# Patient Record
Sex: Female | Born: 1935 | Race: White | Hispanic: No | Marital: Married | Smoking: Former smoker
Health system: Southern US, Community
[De-identification: ages and names within clinical notes are randomized; demographics above are authoritative.]

## PROBLEM LIST (undated history)

## (undated) DIAGNOSIS — Z90710 Acquired absence of both cervix and uterus: Secondary | ICD-10-CM

## (undated) DIAGNOSIS — N6019 Diffuse cystic mastopathy of unspecified breast: Secondary | ICD-10-CM

## (undated) DIAGNOSIS — K219 Gastro-esophageal reflux disease without esophagitis: Secondary | ICD-10-CM

## (undated) DIAGNOSIS — S42309A Unspecified fracture of shaft of humerus, unspecified arm, initial encounter for closed fracture: Secondary | ICD-10-CM

## (undated) DIAGNOSIS — I10 Essential (primary) hypertension: Secondary | ICD-10-CM

## (undated) DIAGNOSIS — E782 Mixed hyperlipidemia: Secondary | ICD-10-CM

## (undated) DIAGNOSIS — E7800 Pure hypercholesterolemia, unspecified: Secondary | ICD-10-CM

## (undated) DIAGNOSIS — Z8619 Personal history of other infectious and parasitic diseases: Secondary | ICD-10-CM

## (undated) HISTORY — DX: Acquired absence of both cervix and uterus: Z90.710

## (undated) HISTORY — PX: TONSILLECTOMY: SUR1361

## (undated) HISTORY — PX: TOTAL HIP ARTHROPLASTY: SHX124

## (undated) HISTORY — PX: OTHER SURGICAL HISTORY: SHX169

## (undated) HISTORY — DX: Personal history of other infectious and parasitic diseases: Z86.19

## (undated) HISTORY — PX: BELPHAROPTOSIS REPAIR: SHX369

## (undated) HISTORY — DX: Mixed hyperlipidemia: E78.2

## (undated) HISTORY — DX: Essential (primary) hypertension: I10

## (undated) HISTORY — DX: Diffuse cystic mastopathy of unspecified breast: N60.19

## (undated) HISTORY — PX: TONSILLECTOMY: SHX5217

## (undated) HISTORY — DX: Pure hypercholesterolemia, unspecified: E78.00

## (undated) HISTORY — PX: COLONOSCOPY: SHX174

## (undated) HISTORY — PX: ABDOMINAL HYSTERECTOMY: SHX81

---

## 1972-03-14 HISTORY — PX: VEIN LIGATION: SHX2652

## 1982-03-14 DIAGNOSIS — I701 Atherosclerosis of renal artery: Secondary | ICD-10-CM

## 1982-03-14 DIAGNOSIS — I10 Essential (primary) hypertension: Secondary | ICD-10-CM

## 1982-03-14 HISTORY — DX: Atherosclerosis of renal artery: I70.1

## 1982-03-14 HISTORY — DX: Essential (primary) hypertension: I10

## 1983-03-15 HISTORY — PX: BREAST LUMPECTOMY: SHX2

## 1985-03-14 HISTORY — PX: RENAL ARTERY ANGIOPLASTY: SHX2317

## 2000-03-14 HISTORY — PX: WRIST FRACTURE SURGERY: SHX121

## 2000-08-22 ENCOUNTER — Ambulatory Visit (HOSPITAL_BASED_OUTPATIENT_CLINIC_OR_DEPARTMENT_OTHER): Admission: RE | Admit: 2000-08-22 | Discharge: 2000-08-22 | Payer: Self-pay | Admitting: Plastic Surgery

## 2000-11-27 ENCOUNTER — Encounter (HOSPITAL_COMMUNITY): Admission: RE | Admit: 2000-11-27 | Discharge: 2000-12-27 | Payer: Self-pay | Admitting: Sports Medicine

## 2001-07-31 ENCOUNTER — Encounter: Payer: Self-pay | Admitting: Cardiology

## 2001-07-31 ENCOUNTER — Ambulatory Visit (HOSPITAL_COMMUNITY): Admission: RE | Admit: 2001-07-31 | Discharge: 2001-07-31 | Payer: Self-pay | Admitting: Cardiology

## 2001-08-20 ENCOUNTER — Ambulatory Visit (HOSPITAL_COMMUNITY): Admission: RE | Admit: 2001-08-20 | Discharge: 2001-08-20 | Payer: Self-pay | Admitting: Cardiology

## 2001-08-20 ENCOUNTER — Encounter: Payer: Self-pay | Admitting: Cardiology

## 2002-05-15 ENCOUNTER — Encounter: Payer: Self-pay | Admitting: *Deleted

## 2002-05-15 ENCOUNTER — Observation Stay (HOSPITAL_COMMUNITY): Admission: EM | Admit: 2002-05-15 | Discharge: 2002-05-15 | Payer: Self-pay | Admitting: Emergency Medicine

## 2002-05-16 ENCOUNTER — Ambulatory Visit (HOSPITAL_BASED_OUTPATIENT_CLINIC_OR_DEPARTMENT_OTHER): Admission: RE | Admit: 2002-05-16 | Discharge: 2002-05-16 | Payer: Self-pay | Admitting: Orthopedic Surgery

## 2002-12-31 ENCOUNTER — Encounter: Admission: RE | Admit: 2002-12-31 | Discharge: 2002-12-31 | Payer: Self-pay | Admitting: Orthopaedic Surgery

## 2002-12-31 ENCOUNTER — Encounter: Payer: Self-pay | Admitting: Orthopaedic Surgery

## 2003-05-12 ENCOUNTER — Inpatient Hospital Stay (HOSPITAL_COMMUNITY): Admission: RE | Admit: 2003-05-12 | Discharge: 2003-05-15 | Payer: Self-pay | Admitting: Orthopedic Surgery

## 2008-07-30 ENCOUNTER — Inpatient Hospital Stay (HOSPITAL_COMMUNITY): Admission: RE | Admit: 2008-07-30 | Discharge: 2008-08-02 | Payer: Self-pay | Admitting: Orthopedic Surgery

## 2009-10-13 ENCOUNTER — Ambulatory Visit: Payer: Self-pay | Admitting: Cardiology

## 2010-01-15 ENCOUNTER — Ambulatory Visit: Payer: Self-pay | Admitting: Cardiology

## 2010-06-15 ENCOUNTER — Other Ambulatory Visit: Payer: Self-pay | Admitting: *Deleted

## 2010-06-15 DIAGNOSIS — G4700 Insomnia, unspecified: Secondary | ICD-10-CM

## 2010-06-15 NOTE — Telephone Encounter (Signed)
Refilled meds per fax request.  

## 2010-06-16 MED ORDER — TEMAZEPAM 15 MG PO CAPS: ORAL_CAPSULE | ORAL | Status: DC

## 2010-06-22 ENCOUNTER — Telehealth: Payer: Self-pay | Admitting: Cardiology

## 2010-06-22 LAB — PROTIME-INR
INR: 1.1 (ref 0.00–1.49)
INR: 1.6 — ABNORMAL HIGH (ref 0.00–1.49)
Prothrombin Time: 14.1 seconds (ref 11.6–15.2)
Prothrombin Time: 17.9 seconds — ABNORMAL HIGH (ref 11.6–15.2)
Prothrombin Time: 19.8 seconds — ABNORMAL HIGH (ref 11.6–15.2)

## 2010-06-22 LAB — BASIC METABOLIC PANEL
BUN: 6 mg/dL (ref 6–23)
BUN: 7 mg/dL (ref 6–23)
CO2: 32 mEq/L (ref 19–32)
Calcium: 8.3 mg/dL — ABNORMAL LOW (ref 8.4–10.5)
Chloride: 102 mEq/L (ref 96–112)
Creatinine, Ser: 0.67 mg/dL (ref 0.4–1.2)
Creatinine, Ser: 0.8 mg/dL (ref 0.4–1.2)
GFR calc Af Amer: >60 mL/min (ref >60)
GFR calc non Af Amer: >60 mL/min (ref >60)
Glucose, Bld: 121 mg/dL — ABNORMAL HIGH (ref 70–99)
Glucose, Bld: 147 mg/dL — ABNORMAL HIGH (ref 70–99)

## 2010-06-22 LAB — URINALYSIS, ROUTINE W REFLEX MICROSCOPIC
Glucose, UA: NEGATIVE mg/dL
Hgb urine dipstick: NEGATIVE
Specific Gravity, Urine: 1.018 (ref 1.005–1.030)
pH: 7 (ref 5.0–8.0)

## 2010-06-22 LAB — CBC
HCT: 31.5 % — ABNORMAL LOW (ref 36.0–46.0)
Hemoglobin: 13.5 g/dL (ref 12.0–15.0)
MCHC: 33.9 g/dL (ref 30.0–36.0)
MCHC: 33.9 g/dL (ref 30.0–36.0)
MCHC: 34.6 g/dL (ref 30.0–36.0)
MCV: 92.6 fL (ref 78.0–100.0)
MCV: 93.2 fL (ref 78.0–100.0)
Platelets: 148 10*3/uL — ABNORMAL LOW (ref 150–400)
Platelets: 159 10*3/uL (ref 150–400)
Platelets: 165 10*3/uL (ref 150–400)
RBC: 3.32 MIL/uL — ABNORMAL LOW (ref 3.87–5.11)
RBC: 3.56 MIL/uL — ABNORMAL LOW (ref 3.87–5.11)
RBC: 4.25 MIL/uL (ref 3.87–5.11)
RDW: 13.3 % (ref 11.5–15.5)
RDW: 13.6 % (ref 11.5–15.5)
RDW: 13.5 % (ref 11.5–15.5)
WBC: 8.5 10*3/uL (ref 4.0–10.5)
WBC: 8.7 10*3/uL (ref 4.0–10.5)

## 2010-06-22 LAB — TYPE AND SCREEN
ABO/RH(D): A POS
Antibody Screen: NEGATIVE

## 2010-06-22 LAB — COMPREHENSIVE METABOLIC PANEL
ALT: 19 U/L (ref 0–35)
AST: 23 U/L (ref 0–37)
Alkaline Phosphatase: 53 U/L (ref 39–117)
Calcium: 9.2 mg/dL (ref 8.4–10.5)
GFR calc Af Amer: >60 mL/min (ref >60)
Potassium: 3.8 mEq/L (ref 3.5–5.1)
Sodium: 140 mEq/L (ref 135–145)
Total Protein: 7.4 g/dL (ref 6.0–8.3)

## 2010-06-22 NOTE — Telephone Encounter (Signed)
Wants RN to call her back regarding a dermatologist referral/appointment. Chart placed in [facility] box

## 2010-06-22 NOTE — Telephone Encounter (Signed)
Dr wanting a referral to Dr. Wendi Maya -Dermatologist at [facility]. -pt states the first available appt,. Is in July 2012; per Dr. Glenetta Hew that appt and continue to call to see if there is a cancellation.  Pt was advised.

## 2010-07-05 ENCOUNTER — Telehealth: Payer: Self-pay | Admitting: Cardiology

## 2010-07-05 DIAGNOSIS — L853 Xerosis cutis: Secondary | ICD-10-CM

## 2010-07-05 DIAGNOSIS — I119 Hypertensive heart disease without heart failure: Secondary | ICD-10-CM

## 2010-07-05 NOTE — Telephone Encounter (Signed)
PT MADE AN APPT TO COME BACK IN MAY FOR LABS AND OV-SHE WANTS TO KNOW IF SHE CAN HAVE AN XRAY DONE WHEN SHE COMES IN FOR THE OV? ALSO, WHEN IS THE LAST TIME HER THYROID WAS CHECKED? PLACED CHART IN BOX.

## 2010-07-07 NOTE — Telephone Encounter (Signed)
Spoke with patient and ordered chest xray and thyroid panel for her dry skin

## 2010-07-13 ENCOUNTER — Other Ambulatory Visit: Payer: Self-pay | Admitting: *Deleted

## 2010-07-13 DIAGNOSIS — E7800 Pure hypercholesterolemia, unspecified: Secondary | ICD-10-CM

## 2010-07-26 ENCOUNTER — Other Ambulatory Visit: Payer: Self-pay | Admitting: *Deleted

## 2010-07-26 DIAGNOSIS — I10 Essential (primary) hypertension: Secondary | ICD-10-CM

## 2010-07-26 MED ORDER — LOSARTAN POTASSIUM-HCTZ 100-25 MG PO TABS: 1.0000 | ORAL_TABLET | Freq: Every day | ORAL | Status: DC

## 2010-07-26 NOTE — Telephone Encounter (Signed)
Faxed refill for losartan/hctz to [facility].

## 2010-07-27 NOTE — H&P (Signed)
NAME:  [name], [name] NO.:  [id number]   MEDICAL RECORD NO.:  [id number]          PATIENT TYPE:  INP   LOCATION:  1608                         FACILITY:  [facility]   PHYSICIAN:  Ollen Gross, M.D.    DATE OF BIRTH:  09-03-1933   DATE OF ADMISSION:  07/30/2008  DATE OF DISCHARGE:                              HISTORY & PHYSICAL   DATE OF OFFICE VISIT, HISTORY AND PHYSICAL:  Jul 15, 2008.   CHIEF COMPLAINT:  Left hip pain.   HISTORY OF PRESENT ILLNESS:  The patient is a 77 year old female who has  seen by Dr. Lequita Halt for ongoing left hip pain.  She has known end-stage  arthritis with some dysplasia.  The hip continues to hurt, progressive  in nature.  She felt she would benefit undergoing surgical intervention.  Risks and benefits have been discussed.  She elects to proceed with  surgery.   ALLERGIES:  CIPRO causes a rash, BENADRYL causes her to, be weird, and  keeps awake, MORPHINE causes nervousness and agitation.   CURRENT MEDICATIONS:  Losartan, baby aspirin, Zetia, nadolol, Caltrate  calcium and vitamin D.   PAST MEDICAL HISTORY:  1. Seasonal allergies.  2. Hypertension.  3. Exogenous obesity.  4. Osteopenia.  5. Childhood illness of measles.   PAST SURGERIES:  1. Varicose vein surgery.  2. Renal artery dilatation.  3. Tonsils.  4. Benign lumpectomy.  5. Blepharoplasty.  6. Hysterectomy.  7. Right total hip replacement.   FAMILY HISTORY:  Mother deceased age 87 with a brain tumor.  Father  deceased in his 33s with multiple myeloma.   SOCIAL HISTORY:  Married, housewife, past smoker, 2 children.  She does  have a caregiver lined up.  She does have a living will.   REVIEW OF SYSTEMS:  GENERAL:  No fevers, chills or night sweats.  NEUROLOGIC:  No seizures, syncope or paralysis.  RESPIRATORY:  No  shortness breath, productive cough or hemoptysis.  CARDIOVASCULAR:  No  chest pain, angina or orthopnea.  GI: No nausea, vomiting, diarrhea,  constipation.  GU: No dysuria, hematuria or discharge.  MUSCULOSKELETAL:  Left hip.   PHYSICAL EXAMINATION:  VITAL SIGNS: Pulse 64, respirations 12, blood  pressure 142/76.  GENERAL:  A 77 year old female, well-nourished, well-developed, in no  acute distress, short-statured.  She is alert and oriented, cooperative.  HEENT:  Normocephalic, atraumatic.  Pupils round and reactive.  EOMs  intact.  NECK:  Supple.  CHEST:  Clear.  HEART:  Regular rate and rhythm.  No murmur.  ABDOMEN:  Soft, nontender.  Bowel sounds present.  RECTAL, BREASTS, GENITALIA:  Not done, not pertinent to present illness.  EXTREMITIES:  Left hip:  Flexion 100, internal rotation 10, external  rotation 20, abduction 30.   IMPRESSION:  Osteoarthritis, left hip.   PLAN:  The patient admitted to [facility] to undergo a left  total replacement and arthroplasty.  She has been seen preoperatively by  Dr. Patty Sermons, underwent a stress test back on Jul 14, 2008.  Normal  stress nuclear test, no ischemia, normal LV function.  Felt to be stable  and okay for surgery.      Alexzandrew L. Perkins, P.A.C.      Ollen Gross, M.D.  Electronically Signed    ALP/MEDQ  D:  07/30/2008  T:  07/30/2008  Job:  [id number]   cc:   Ollen Gross, M.D.  Fax: [phone]   Cassell Clement, M.D.  Fax: [phone]

## 2010-07-27 NOTE — Op Note (Signed)
NAME:  Kelsey, Glass NO.:  [id number]   MEDICAL RECORD NO.:  [id number]          PATIENT TYPE:  INP   LOCATION:  0003                         FACILITY:  [facility]   PHYSICIAN:  Ollen Gross, M.D.    DATE OF BIRTH:  14-Oct-1933   DATE OF PROCEDURE:  07/30/2008  DATE OF DISCHARGE:                               OPERATIVE REPORT   PREOPERATIVE DIAGNOSES:  Osteoarthritis, left hip.   POSTOPERATIVE DIAGNOSES:  Osteoarthritis, left hip.   PROCEDURE:  Left total hip arthroplasty.   SURGEON:  Ollen Gross, M.D.   ASSISTANT:  Avel Peace PA-C   ANESTHESIA:  Spinal.   ESTIMATED BLOOD LOSS:  300.   DRAINS:  Hemovac times one.   COMPLICATIONS:  None.   CONDITION:  Stable to recovery.   CLINICAL NOTE:  [name] is a 77 year old female with severe end-stage  arthritis of the left hip with progressively worsening pain and  dysfunction.  She has had a previous successful right total hip  arthroplasty and presents now for left total hip arthroplasty.   PROCEDURE IN DETAIL:  After successful administration of spinal  anesthetic, the patient is placed in the right lateral decubitus  position with the left side up and held with the hip positioner.  Left  lower extremity is isolated from her perineum with plastic drapes and  prepped and draped in the usual sterile fashion.  Short posterolateral  incision is made with a 10-blade through subcutaneous tissue to the  level of fascia lata which is incised in line with the skin incision.  The sciatic nerve is palpated and protected and the short external  rotators isolated off the femur.  Capsulotomy is then performed and the  capsule retracted.  The hip is then dislocated.  The center of the  femoral head is marked and a trial prosthesis is placed such that the  center of the trial head corresponds to the center of native femoral  head.  Osteotomy line is marked on the femoral neck and osteotomy made  with an oscillating saw.   The femoral head is then removed and the femur  retracted anteriorly to gain acetabular exposure.   Acetabular reaming is initiated with a 45, coursing increments of 2 to a  55 and a 56-mm Pinnacle acetabular shell is placed in anatomic position  with excellent purchase and transfixed with an additional dome screw.  The apex hole eliminator is placed and then the permanent 40-mm neutral  ULTAMET metal liner is placed for metal-on-metal hip replacement.   The femur is prepared with the canal finder and irrigation.  Axial  reaming is performed to 13.5 mm, proximal reaming to an 59F and the  sleeve machined to a large.  59F large trial sleeve is placed with an 18  x 13 stem and 36 + 8 neck, 10 degrees beyond her native anteversion.  A  trial 40 + 0 head is placed and reduces too easily.  I went to a 40 + 6,  which reduces with more appropriate soft-tissue tension.  She had great  stability with  full extension, full external rotation 70 degrees  flexion, 40 degrees adduction and 90 degrees internal rotation and 90  degrees of flexion and 70 degrees of internal rotation.  By placing the  left leg on top of the right it is found that the leg lengths are equal.  The hip is then dislocated and the trials are removed.  The permanent  26F large sleeve is placed into the femur and then the 18 x 13 stem with  36 + 8 neck, 10 degrees beyond native anteversion, is impacted.  The 40  + 6 head is placed and the hip is reduced with the same stability  parameters.  Again, the leg lengths are found to be equal.   We then copiously irrigated the wound and reattached the short rotators  and capsule to the femur with Ethibond suture.  Fascia lata was closed  over a Hemovac drain with interrupted #1 Vicryl, subcu closed #1 and 2-0  Vicryl and subcuticular running 4-0 Monocryl.  The incision is then  cleaned and dried and Steri-Strips and a bulky sterile dressing applied.  Drain is hooked to suction and the  left lower extremity placed in a knee  immobilizer.  She is then awakened and transported to recovery in stable  condition.      Ollen Gross, M.D.  Electronically Signed     FA/MEDQ  D:  07/30/2008  T:  07/30/2008  Job:  [id number]

## 2010-07-29 ENCOUNTER — Other Ambulatory Visit (INDEPENDENT_AMBULATORY_CARE_PROVIDER_SITE_OTHER): Payer: Medicare Other | Admitting: *Deleted

## 2010-07-29 DIAGNOSIS — E7800 Pure hypercholesterolemia, unspecified: Secondary | ICD-10-CM

## 2010-07-29 DIAGNOSIS — L853 Xerosis cutis: Secondary | ICD-10-CM

## 2010-07-29 DIAGNOSIS — R238 Other skin changes: Secondary | ICD-10-CM

## 2010-07-29 LAB — CBC WITH DIFFERENTIAL/PLATELET
Basophils Absolute: 0.1 K/uL (ref 0.0–0.1)
Basophils Relative: 1 % (ref 0.0–3.0)
Eosinophils Absolute: 0.3 K/uL (ref 0.0–0.7)
Eosinophils Relative: 6 % — ABNORMAL HIGH (ref 0.0–5.0)
HCT: 37.3 % (ref 36.0–46.0)
Hemoglobin: 12.6 g/dL (ref 12.0–15.0)
Lymphocytes Relative: 25.7 % (ref 12.0–46.0)
Lymphs Abs: 1.5 K/uL (ref 0.7–4.0)
MCHC: 33.7 g/dL (ref 30.0–36.0)
MCV: 92.6 fl (ref 78.0–100.0)
Monocytes Absolute: 0.6 K/uL (ref 0.1–1.0)
Monocytes Relative: 9.8 % (ref 3.0–12.0)
Neutro Abs: 3.3 K/uL (ref 1.4–7.7)
Neutrophils Relative %: 57.5 % (ref 43.0–77.0)
Platelets: 212 K/uL (ref 150.0–400.0)
RBC: 4.03 Mil/uL (ref 3.87–5.11)
RDW: 14.3 % (ref 11.5–14.6)
WBC: 5.7 K/uL (ref 4.5–10.5)

## 2010-07-29 LAB — BASIC METABOLIC PANEL WITH GFR
BUN: 17 mg/dL (ref 6–23)
CO2: 33 meq/L — ABNORMAL HIGH (ref 19–32)
Calcium: 9.3 mg/dL (ref 8.4–10.5)
Chloride: 103 meq/L (ref 96–112)
Creatinine, Ser: 0.9 mg/dL (ref 0.4–1.2)
GFR: 69.29 mL/min
Glucose, Bld: 83 mg/dL (ref 70–99)
Potassium: 4 meq/L (ref 3.5–5.1)
Sodium: 141 meq/L (ref 135–145)

## 2010-07-29 LAB — HEPATIC FUNCTION PANEL
ALT: 17 U/L (ref 0–35)
Albumin: 3.4 g/dL — ABNORMAL LOW (ref 3.5–5.2)
Total Protein: 6.4 g/dL (ref 6.0–8.3)

## 2010-07-29 LAB — LIPID PANEL
Cholesterol: 156 mg/dL (ref 0–200)
Triglycerides: 84 mg/dL (ref 0.0–149.0)

## 2010-07-29 LAB — T4, FREE: Free T4: 0.84 ng/dL (ref 0.60–1.60)

## 2010-07-29 LAB — TSH: TSH: 2.49 u[IU]/mL (ref 0.35–5.50)

## 2010-07-30 NOTE — Op Note (Signed)
[place]  Patient:    [name], [name]                            MRN: [id number] Proc. Date: 08/22/00 Adm. 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  OPERATION PERFORMED:  Bilateral upper blepharoplasty.  SURGEON:  Consuello Bossier., M.D.  ANESTHESIA:  Xylocaine 1% with epinephrine 1:100,000 with MAC.  FINDINGS:  The patient had bilateral upper blepharochalasis and had had previous visual field studies which had shown some visual field impairment. She also had quite a bit of medial fat.  The above surgical procedure was carried out.  DESCRIPTION OF PROCEDURE:  The patient was brought to the operating room having been marked for the planned extent of the incisions from medially to laterally as well as the fat to be removed medially.  She was given an IV monitored with anesthesia standby throughout.  She was prepped with Betadine and draped sterilely about the face.  She was marked for elliptical upper eyelid incisions to be made.  She was anesthetized with Xylocaine 1% with epinephrine 1:100,000.  Following the onset of anesthesia, the upper eyelid incisions were made and resection of this ellipse with underlying strip of orbicularis muscle was performed.  Bleeding was controlled with electrocautery unit.  Medially the fairly prominent fat pad which was more prominent on the left than the right was easily identified and brought into the wound and the base of it resected with the electrocautery unit, bleeding further controlled with a electrocautery unit from this area was further treated in terms of the fat.  At this point the upper eyelid incisions were closed with interrupted and running #6-0 Prolene  producing 1 to 2 mm of lag ophthalmus.  Neosporin ointment and moistened saline compresses were applied.  The patient tolerated the procedure well and was able to  be discharged from the operating room to the recovery room subsequently to be followed by me as an outpatient. DD:  08/22/00 TD:  08/22/00 Job: [id number]

## 2010-07-30 NOTE — Discharge Summary (Signed)
NAME:  Kelsey, Glass NO.:  [id number]   MEDICAL RECORD NO.:  [id number]          PATIENT TYPE:  INP   LOCATION:  1608                         FACILITY:  [facility]   PHYSICIAN:  Ollen Gross, M.D.    DATE OF BIRTH:  [birth year]   DATE OF ADMISSION:  07/30/2008  DATE OF DISCHARGE:  08/02/2008                               DISCHARGE SUMMARY   ADMITTING DIAGNOSES:  1. Osteoarthritis left hip.  2. Hypertension.  3. Seasonal allergies.   DISCHARGE DIAGNOSES:  1. Osteoarthritis left hip, status post left total hip replacement      arthroplasty.  2. Hypertension.  3. Seasonal allergies.   PROCEDURE:  Jul 30, 2008, left total hip.  Surgeon Dr. Lequita Halt,  assistant Avel Peace, P.A.C.  Spinal anesthesia.   CONSULTS:  None.   BRIEF HISTORY:  [name] is a 77 year old female with severe  _osteoarthritis of the left hip with progressive worsening pain and  dysfunction who failed operative management.  She has had previous right  total hip and now presents for a left total hip.   LABORATORY DATA:  Preop CBC showed a hemoglobin 13.5, hematocrit 39.1,  white cell count 5, platelets 197,000.  Postop hemoglobin 11.2, then  10.6.  Last night hemoglobin and hematocrit 10.5 and 31.  PT and PTT  preop 12.9 and 28 respectively.  INR 1.  Serial protimes followed per  Coumadin protocol.  Last known PT/INR of 19.8 and 1.6.  Chem panel on  admission within normal limits.  Serial BMETs were followed.  Electrolytes remained within normal limits.  Preop UA negative.  Blood  group type A+.   EKG July 09, 2008, normal sinus rhythm, nonspecific T changes confirmed  by Dr. Patty Sermons.  Pelvis film Jul 30, 2008, status post left total hip  without acute complications.  Left hip film Jul 30, 2008, no acute  findings, severe left hip osteoarthritis.  Left hip film Jul 30, 2008,  left hip prosthesis without acute complications.   HOSPITAL COURSE:  The patient admitted to [facility] and taken  to the OR and underwent the above stated procedure without complication.  The patient tolerated the procedure well and was transferred to recovery  room of the orthopedic floor.  She was started on PCA and p.o. analgesic  pain control following surgery.  Given 24 hours of postop IV  antibiotics.  Start on Coumadin for Coumadin protocol.  She was a little  uncomfortable on day 1, but pain was under descent control.  Did not get  much sleep.  Hemovac drain placed during the surgery was pulled on day  1.  Blood pressure was doing well.  Started back on her home meds.  By  day 2, the patient was doing a little bit better, still sore.  Dressing  change incision looked good.  Hemoglobin was stable.  Output looked  good.  Diuresing well.  Continued to slowly progress with physical  therapy walking over 120 feet.  By postop day 3, she was doing well and  no complaints and discharge home.  DISCHARGE PLAN:  1. The patient is discharged home on Aug 02, 2008.  2. Discharge diagnoses:  Please see above.  3. Discharge medications:  Percocet, Robaxin, and Coumadin.   ACTIVITY:  Partial weightbearing 25-50% left lower extremity.  Hip  precaution total hip protocol.   FOLLOWUP:  2 weeks.   DISPOSITION:  Home.      [name], P.A.C.      Ollen Gross, M.D.  Electronically Signed    ALP/MEDQ  D:  09/11/2008  T:  09/11/2008  Job:  [id number]   cc:   Ollen Gross, M.D.  Fax: [phone]   Chart   Cassell Clement, M.D.  Fax: [phone]

## 2010-07-30 NOTE — Op Note (Signed)
NAME:  [name], [name] NO.:  [id number]   MEDICAL RECORD NO.:  [id number]                   PATIENT TYPE:  AMB   LOCATION:  DSC                                  FACILITY:  [facility]   PHYSICIAN:  Katy Fitch. Naaman Plummer., M.D.          DATE OF BIRTH:  May 26, 1933   DATE OF PROCEDURE:  05/16/2002  DATE OF DISCHARGE:                                 OPERATIVE REPORT   PREOPERATIVE DIAGNOSIS:  Severely comminuted intra-articular fracture of  right distal radius with extreme comminution of both  dorsal and volar  cortex with incongruous distal radioulnar joint and loss of radial slope,  tilt and length.   POSTOPERATIVE DIAGNOSIS:  Severely comminuted intra-articular fracture of  right distal radius with extreme comminution of both  dorsal and volar  cortex with incongruous distal radioulnar joint and loss of radial slope,  tilt and length.   OPERATION PERFORMED:  Open reduction internal fixation of severely  comminuted right distal radius with interfragmentary fixation, application  of a seven-peg volar DVR plate system and cancellous allograft bone graft.   SURGEON:  Katy Fitch. Sypher, M.D.   ASSISTANT:  Jonni Sanger, P.A.   ANESTHESIA:  General by LMA.   SUPERVISING ANESTHESIOLOGIST:  Janetta Hora. Gelene Mink, M.D.   INDICATIONS FOR PROCEDURE:  The patient is a 77 year old woman who fell on  the afternoon of May 15, 2002 with full weightbearing onto her outstretched  right hand.  She had immediate pain and deformity.  She was transported to  [facility] Emergency Department where x-rays were reviewed by Helane Gunther,  M.D. revealing a severely comminuted right distal radial Barton's type  fracture.  She had severe comminution of the volar and ulnar cortex with an  incongruous distal radioulnar joint and loss of the normal architecture of  the distal radius.   An orthopedic consult was obtained with Dr. Cleophas Dunker.  Dr. Cleophas Dunker  reviewed the films and  felt this was an extremely challenging fracture,  therefore, he requested and upper extremity orthopedic/hand surgery consult.  The patient was seen for evaluation and noted to have normal neurovascular  status of her hand.  She had no other significant injuries.  She was placed  in a sugar tong splint and prepared for elective reconstructive surgery on  May 16, 2002.  After informed consent she was brought to the operating room  at this time.  She understands that she may have difficulties with healing  including infection, loss of fixation, requirement for a secondary procedure  including further bone grafting and/or osteoplasty of the bone to correct a  deformity.   DESCRIPTION OF PROCEDURE:  The patient was brought to the operating room and  placed in supine position upon the operating table.  Following the induction  of general anesthesia by LMA, the right arm was prepped with Betadine soap  and solution and sterilely draped.  1g of  Ancef was administered as an IV  prophylactic antibiotic.   The procedure commenced with an extended DVR incision paralleling the path  of the flexor carpi radialis extending across the distal wrist flexion  crease at the base of the thumb.  The subcutaneous tissue were carefully  divided taking care to identify and to ligate several large caliber veins.  The interval between the flexor carpi radialis and the radial artery was  carefully dissected with retraction of the radial artery in a radial  direction and retraction of the flexor carpi radialis in the ulnar  direction.   The fascia at the floor of the flexor carpi radialis was released followed  by retraction of the flexor pollicis longus in an ulnar direction and  elevation of the pronator quadratus.  The fracture was extremely comminuted.  The patient is severely osteoporotic and has a very thin cortex on the volar  aspect of the radius.  There were approximately six fragments on the volar   aspect of the radial cortex and about 6 to 7 fragments dorsally.  The  fracture was completely opened, cleared of clot and attempted to be  reassembled piecemeal.  Due to the severe comminution I had to assemble the  fracture around an internal cancellous graft packed within the metaphysis of  the radius and the epiphysis.   Around a scaffolding of cancellous graft I was able to reassemble the  fracture fragments like tiles of a mosaic and then apply a volar buttress  plate.   A DVR seven-peg plate system was applied to the palmar aspect of the distal  radius with the use of interfragmentary fixation to secure one of the ulnar  cortical fragments supporting the distal radial ulnar joint.  Ultimately, an  anatomic reconstruction of the radius was achieved with excellent  buttressing of the multiple fracture fragments.  The cancellous graft should  allow satisfactory support of the distal radius.  The ulnar styloid reduced  in a satisfactory manner.  Multiple images showed that the joint surface was  restored to a normal volar tilt, proper slope and the distal radial ulnar  joint was once again congruous.  There are probably 14 fragments countable  in this fracture pattern.   The wound was then thoroughly lavaged with sterile saline followed by repair  of the pronator quadratus over the fracture site with mattress sutures of 2-  0 Vicryl followed by repair of the skin with subdermal sutures of 3-0 Vicryl  and intradermal 3-0 Prolene.  The skin was placed in a voluminous gauze  dressing and a sugar tong splint with the forearm in supination.  There were  no apparent complications.  The patient will be admitted to the recovery  care center for observation of her vital signs.  She will be discharged in  24 hours with prescriptions for [facility] one or two tablets p.o. four  to six hours p.r.n. pain, Keflex 500 mg one p.o. q.8h. times four days as a prophylactic antibiotic and Motrin as  an antiinflammatory analgesic.                                               Katy Fitch Naaman Plummer., M.D.    RVS/MEDQ  D:  05/16/2002  T:  05/17/2002  Job:  [id number]

## 2010-07-30 NOTE — H&P (Signed)
NAME:  [name], [name] NO.:  [id number]   MEDICAL RECORD NO.:  [id number]                   PATIENT TYPE:  INP   LOCATION:  NA                                   FACILITY:  [facility]   PHYSICIAN:  Ollen Gross, M.D.                 DATE OF BIRTH:  01-21-1934   DATE OF ADMISSION:  05/12/2003  DATE OF DISCHARGE:                                HISTORY & PHYSICAL   CHIEF COMPLAINT:  Right hip pain.   HISTORY OF PRESENT ILLNESS:  The patient is a 77 year old female seen by Dr.  Lequita Halt for right hip pain.  She has a 9 to 10-year history of intermittent  discomfort in the right hip.  It has been progressive over the past year.  It is mainly in the groin but does involve the leg, radiating down into the  knee.  She has occasional discomfort on the left, but the right is far  worse.  She has been on medications in the past including Motrin and  Celebrex.  It is starting to limit her activity and the things that she  enjoys doing.  She is also having some pain at night.  She is seen in the  office.  X-rays are brought over.  Previous x-rays back in 2004 demonstrated  end-stage arthritis to the right hip with bone-on-bone changes and large  osteophytes.  Due to the findings, it is felt she has reached a point where  she could benefit from undergoing surgical intervention.  Risks and benefits  have been discussed with the patient, and she elects to proceed with  surgery.   ALLERGIES:  MORPHINE causes nervousness and agitation.  CIPRO causes a rash.  BENADRYL causes the patient to be wired.   CURRENT MEDICATIONS:  1. Celebrex 200 mg daily.  2. Hyzaar 25 mg daily.  3. Zetia 10 mg daily.  4. Nadolol 10 mg daily.  5. Fosamax 70 mg weekly.  6. Glucosamine chondroitin.  7. Multivitamins.  8. Weekly allergy shot.   PAST MEDICAL HISTORY:  1. Hypertension.  2. Seasonal allergies.   PAST SURGICAL HISTORY:  1. Varicose vein surgery.  2. Renal artery dilatation.  3. Hysterectomy.  4. Right wrist surgery.  5. Tonsillectomy.  6. Bilateral breast lumpectomies which have been benign.  7. Blepharoplasty.   SOCIAL HISTORY:  Married.  Quit smoking approximately 16 years ago.  Occasional intake of alcohol.   FAMILY HISTORY:  Mother deceased at 40 with history of brain tumor and lung  cancer.  Father deceased in his 73s, multiple myeloma.   REVIEW OF SYSTEMS:  GENERAL:  No fever, chills, night sweats.  NEUROLOGIC:  No seizure, syncope, paralysis.  RESPIRATORY:  Slight cough.  Had a recent  sinus infection.  She has recently been treated with a Z-Pak antibiotic  dosage.  She denies any productive cough at this time and no  hemoptysis.  CARDIOVASCULAR:  No chest pain, angina.  GI: No nausea, vomiting, diarrhea,  constipation.  GU:  No dysuria, hematuria, discharge.  MUSCULOSKELETAL:  Pertinent for the hip found is History of Present Illness.   PHYSICAL EXAMINATION:  VITAL SIGNS:  Pulse 60, respirations 12, blood  pressure 148/86.  GENERAL:  A 77 year old white female,well-nourished, well-developed, in no  acute distress, alert, oriented, cooperative, pleasant.  HEENT:  Normocephalic and atraumatic.  Pupils equal, round, and reactive to  light.  Oropharynx clear.  EOMs are intact.  NECK:  Supple.  CHEST:  Clear to auscultation anterior and posterior chest walls.  HEART:  Regular rate and rhythm with no murmurs.  ABDOMEN:  Soft, nontender, slightly round.  Bowel sounds are present.  EXTREMITIES:  The right hip shows hip flexion of only 90 degrees, internal  rotation at 5, external rotation at 15, abduction of 50.  This is more  pronounced when compared to the left hip which shows flexion of 100 degrees,  internal rotation of 20 degrees, external rotation of 20 degrees, abduction  of 30 degrees.   IMPRESSION:  1. Osteoarthritis, right hip.  2. Hypertension.  3. Allergies.   PLAN:  The patient will be admitted to [facility] and undergo   right total hip replacement arthroplasty.  The surgery will be performed by  Dr. Ollen Gross.     [name], P.A.              Ollen Gross, M.D.    ALP/MEDQ  D:  05/11/2003  T:  05/11/2003  Job:  [id number]

## 2010-07-30 NOTE — Op Note (Signed)
NAME:  [name], [name] NO.:  [id number]   MEDICAL RECORD NO.:  [id number]                   PATIENT TYPE:  INP   LOCATION:  0007                                 FACILITY:  [facility]   PHYSICIAN:  Ollen Gross, M.D.                 DATE OF BIRTH:  February 25, 1934   DATE OF PROCEDURE:  05/12/2003  DATE OF DISCHARGE:                                 OPERATIVE REPORT   PREOPERATIVE DIAGNOSIS:  Osteoarthritis, right hip.   POSTOPERATIVE DIAGNOSIS:  Osteoarthritis, right hip.   PROCEDURE:  Total hip arthroplasty.   SURGEON:  Ollen Gross, M.D.   ASSISTANT:  Alexzandrew L. Julien Girt, P.A.   ANESTHESIA:  General anesthesia.   ESTIMATED BLOOD LOSS:  500.   DRAINS:  Hemovac x1.   COMPLICATIONS:  None.   BRIEF CLINICAL NOTE:  [name] is a 77 year old female with end-stage  osteoarthritis of the right hip with pain refractory to nonoperative  management.  She presents now for right total hip arthroplasty.   PROCEDURE IN DETAIL:  After successful administration of general anesthetic,  the patient was placed in a left lateral decubitus position.  The right  lower extremity was isolated from her perineum with plastic drapes and  prepped and draped in the usual sterile fashion.  A standard posterolateral  incision was made with a 10 blade through the subcutaneous tissue to the  level of the fascia lata, which was incised in line with the skin incisions.  Sciatic nerve was palpated and protected and showed external rotators  isolated off of the femur.  Capsulectomies were performed, and the hip was  dislocated.  The center of the femoral head is marked, and a trial  prosthesis placed at the center of the trial head.  It corresponded to the  center of native femoral head.  An osteotomy line is marked on the femoral  neck, and osteotomy is made with an off-loading saw.  The femur was then  retracted anteriorly to gain acetabular exposure.  Acetabular osteophytes  and  labrum are removed.  The reaming started at 47, coursing increments of 2  to a 53, and then a 54 mm Pinnacle acetabular shell was placed in an  anatomical position and transfixed with two dome screws.  Trial 36 mm  neutral liner is placed.   The femur is prepared first with a canal finder and irrigation.  Axial  maneuvers were performed at 13.5 mm proximally with an 14F and a sleeve  machine to an extra-extra large.  An 14F extra-extra large trial sleeve is  placed, and an 18 x 13 stem and a 36+8 neck.  I went 10 degrees beyond her  native anteversion to give more appropriate version.  A 36+0 trial head was  placed, and the hip was reduced with outstanding stability, full extension  and full rotation with 70 degrees flexion, 40 degrees abduction,  90 degrees  internal rotation, and 90 degrees of flexion with 70 degrees internal  rotation.  The trials were then removed, and the permanent apex hole  eliminator placed.  A 36 mm neutral Ultramet metal liner was placed.  This  was the metal-on-metal hook replacement.  The 20 F extra-extra large sleeve  is placed, and an 18 x 33 stem with 36+ 8 neck about 10 degrees beyond her  native anteversion.  Once the stem was fully sleeved, the permanent 36 mm +0  head is placed.  The hip is reduced with the same stability parameters.  The  wound is copiously irrigated with antibiotic solution, and the external  rotators were reattached to the femur through drill holes.  The fascia lata  is closed over a Hemovac drain with interrupted #1 Vicryl in the subcu and  mucosa and then 2-0 Vicryl and subcuticular running 4-0 Monocryl.  Then 20  cc of 0.25% Marcaine with epinephrine injected into the subcutaneous  tissues.  Steri-Strips and a bulky sterile dressing have been applied.  She  is placed in a knee immobilizer, awakened, and transported to the recovery  in stable condition.                                               Ollen Gross, M.D.     FA/MEDQ  D:  05/12/2003  T:  05/12/2003  Job:  [id number]

## 2010-07-30 NOTE — Discharge Summary (Signed)
NAME:  AGNESS, SIBRIAN NO.:  [id number]   MEDICAL RECORD NO.:  [id number]                   PATIENT TYPE:  INP   LOCATION:  0483                                 FACILITY:  [facility]   PHYSICIAN:  Ollen Gross, M.D.                 DATE OF BIRTH:  [birth year]   DATE OF ADMISSION:  05/12/2003  DATE OF DISCHARGE:  05/15/2003                                 DISCHARGE SUMMARY   ADMITTING DIAGNOSES:  1. Osteoarthritis, right hip.  2. Hypertension.  3. Allergies.   DISCHARGE DIAGNOSIS:  1. Osteoarthritis, right hip, status post right total hip arthroplasty.  2. Hypertension.  3. Allergies.   PROCEDURE:  May 12, 2003 - right total hip arthroplasty.  Surgeon - Dr.  Homero Fellers Aluisio.  Assistant - Avel Peace, P.A.C.  Anesthesia - general.  Blood loss - 500 cc.  Hemovac drain x1.   CONSULTS:  None.   BRIEF HISTORY:  [name] is a 77 year old female with end-stage  osteoarthritis of the right hip that has been refractory to nonoperative  management and now presents for total hip replacement.   LABORATORY DATA:  CBC on admission had a hemoglobin of 13.8, hematocrit of  41.0, white cell count 7.0, red cell count 4.48.  Differential within normal  limits.  Postoperative hemoglobin and hematocrit 11.5 and 34.0.  Last noted  hemoglobin and hematocrit 11.4 and 33.5.  PT and PTT preoperative 11.6 and  29 respectively.  INR 0.8.  Serum pro time was followed.  Last noted PT INR  of 16.3 and 1.5.  Chemistry panel on admission with slightly elevated CO2 of  34.  Remaining chemistry panel all within normal limits.  Serial BMETs were  followed.  Sodium dropped from 136, down to 134, back up to 137.  Glucose  went up from 79 to 145.  Urinalysis preoperatively negative.  Blood group  type A positive.  Preoperative chest x-ray dated May 05, 2003 revealed  slight cardiomyopathy, no active disease.  Right hip films revealed  degenerative changes of the hips  bilaterally, right greater than left.  Postoperative hip films on May 12, 2003 revealed normal postoperative  appearance, status post total hip replacement.   HOSPITAL COURSE:  The patient was admitted to [facility], taken to  the operating room, underwent the above-stated procedure without  complication, tolerated the procedure, later transferred to the recovery  room, and then to the orthopedic floor to continue  postoperative care.  Vital signs were followed.  The patient was placed on Coumadin for DVT  prophylaxis, given 24 hours of postoperative IV antibiotics, Ancef, placed  partial weightbearing at 50%.  PT and OT were consulted to assist with gait  training, ambulation, and ADL's.  Started back on her home medications.  Hemovac drain placed during surgery was pulled on postoperative day #1.  She  is actually doing very well  with her pain control by day #1 after surgery.  Started getting up out of bed with therapy.  By day #2, she was doing a  little bit better, less pain.  Dressing was changed, and the incision was  healing well.  PCA and IVs were discontinued.  By Dr. Shannan Harper, the patient was  doing much better.  She started progressing very well with physical therapy,  up ambulating greater than 150 feet.  No complaints.  Was tolerating p.o.  medications, and was discharged home.   DISCHARGE PLAN:  1. The patient was discharged home on May 15, 2003.  2. Discharge diagnoses, please see above.   DISCHARGE MEDICATIONS:  1. Vicodin.  2. Robaxin.  3. Coumadin.   FOLLOW UP:  In 2 weeks.   ACTIVITY:  Partial weightbearing of 50%.  Hip precautions at all times.  Home health PT and home health nursing.   DIET:  Low-sodium diet.   DISPOSITION:  Home.   CONDITION ON DISCHARGE:  Improved.     Alexzandrew L. Julien Girt, P.A.              Ollen Gross, M.D.    ALP/MEDQ  D:  06/24/2003  T:  06/24/2003  Job:  [id number]

## 2010-08-03 ENCOUNTER — Other Ambulatory Visit: Payer: Self-pay | Admitting: *Deleted

## 2010-08-05 ENCOUNTER — Encounter: Payer: Self-pay | Admitting: Cardiology

## 2010-08-06 ENCOUNTER — Ambulatory Visit (INDEPENDENT_AMBULATORY_CARE_PROVIDER_SITE_OTHER): Payer: Medicare Other | Admitting: Cardiology

## 2010-08-06 ENCOUNTER — Ambulatory Visit
Admission: RE | Admit: 2010-08-06 | Discharge: 2010-08-06 | Disposition: A | Discharge status: Home / Self Care | Payer: Medicare Other | Source: Ambulatory Visit | Attending: Cardiology | Admitting: Cardiology

## 2010-08-06 ENCOUNTER — Encounter: Payer: Self-pay | Admitting: Cardiology

## 2010-08-06 DIAGNOSIS — I119 Hypertensive heart disease without heart failure: Secondary | ICD-10-CM

## 2010-08-06 DIAGNOSIS — E785 Hyperlipidemia, unspecified: Secondary | ICD-10-CM

## 2010-08-06 DIAGNOSIS — E7800 Pure hypercholesterolemia, unspecified: Secondary | ICD-10-CM

## 2010-08-06 DIAGNOSIS — Z79899 Other long term (current) drug therapy: Secondary | ICD-10-CM

## 2010-08-06 NOTE — Assessment & Plan Note (Signed)
The patient has a history of hypercholesterolemia.  She is intolerant to all statins.  She has been able to tolerate Zetia 10 mg daily.  We're checking labs today.

## 2010-08-06 NOTE — Assessment & Plan Note (Signed)
The patient has a history of hypertension.  She has a remote history of having had PTCA of a renal artery stenosis.  The angioplasty was done in March 1987.  She had significant immediate improvement after the angioplasty.  Subsequently she has developed essential hypertension of a mild degree been treated with oral agents.She's not having any dizziness or syncope.  She denies any chest pain.

## 2010-08-06 NOTE — Progress Notes (Signed)
Ofilia Neas Date of Birth:  03/02/1934 [facility] [address]   [address] [place], [place]  [address] [phone]           Fax   [phone]  HPI: This pleasant 77 year old woman is seen for a six-month followup office visit.  She has a history of essential hypertension.  She had a remote history of renal artery angioplasty in 1987 by Dr. Jean Rosenthal.  She has not been expressing any chest pain or shortness of breath.  He is disappointed that she has lost only 1 pound since last visit.  She does not have any history of known coronary disease.  She had a normal LexiScan Cardiolite stress test on 07/14/08 with no ischemia and with an ejection fraction of 76%.  Since last visit the patient has continued to have a terrible time with allergic dry skin.  She is now seeing a skin allergy specialist at [facility].  They are elevating foods one at that time to see if this could be a food allergy causing the dry skin.  Current Outpatient Prescriptions  Medication Sig Dispense Refill  . aspirin 81 MG tablet Take 81 mg by mouth daily.        . Calcium Carbonate-Vitamin D (CALCIUM + D PO) Take by mouth.        . ezetimibe (ZETIA) 10 MG tablet Take 10 mg by mouth daily.        Marland Kitchen losartan-hydrochlorothiazide (HYZAAR) 100-25 MG per tablet Take 1 tablet by mouth daily.  30 tablet  11  . nadolol (CORGARD) 40 MG tablet Take 40 mg by mouth daily.        . pantoprazole (PROTONIX) 40 MG tablet Take 40 mg by mouth daily.        . potassium chloride (KLOR-CON) 10 MEQ CR tablet Take 10 mEq by mouth daily.        . temazepam (RESTORIL) 15 MG capsule Take 1 to 2 at bedtime as needed  30 capsule  5    Allergies  Allergen Reactions  . Baycol (Cerivastatin Sodium)   . Ciprofloxacin Hives  . Crestor (Rosuvastatin Calcium)   . Lipitor (Atorvastatin Calcium)   . Morphine And Related   . Pravachol     Patient Active Problem List  Diagnoses  . Benign hypertensive heart disease without heart  failure  . Hypercholesterolemia    History  Smoking status  . Former Smoker  . Quit date: 03/15/1987  Smokeless tobacco  . Not on file    History  Alcohol Use No    No family history on file.  Review of Systems: The patient denies any heat or cold intolerance.  No weight gain or weight loss.  The patient denies headaches or blurry vision.  There is no cough or sputum production.  The patient denies dizziness.  There is no hematuria or hematochezia.  The patient denies any muscle aches or arthritis.  The patient denies any rash.  The patient denies frequent falling or instability.  There is no history of depression or anxiety.  All other systems were reviewed and are negative.   Physical Exam: Filed Vitals:   08/06/10 0859  BP: 126/76  Pulse: 74  The general appearance feels a well-developed well-nourished woman in no distress.Pupils equal and reactive.   Extraocular Movements are full.  There is no scleral icterus.  The mouth and pharynx are normal.  The neck is supple.  The carotids reveal no bruits.  The  jugular venous pressure is normal.  The thyroid is not enlarged.  There is no lymphadenopathy.The chest is clear to percussion and auscultation. There are no rales or rhonchi. Expansion of the chest is symmetrical.The precordium is quiet.  The first heart sound is normal.  The second heart sound is physiologically split.  There is no murmur gallop rub or click.  There is no abnormal lift or heave.The abdomen is soft and nontender. Bowel sounds are normal. The liver and spleen are not enlarged. There Are no abdominal masses. There are no bruits.The pedal pulses are good.  There is no phlebitis or edema.  There is no cyanosis or clubbing.Strength is normal and symmetrical in all extremities.  There is no lateralizing weakness.  There are no sensory deficits.    Assessment / Plan:  Work harder on weight loss and careful diet.  Continue same medication.  Recheck in 6 months for  followup office visit and fasting lab work

## 2010-08-11 ENCOUNTER — Telehealth: Payer: Self-pay | Admitting: *Deleted

## 2010-08-11 NOTE — Telephone Encounter (Signed)
Adv. Patient of CXR results 

## 2010-09-23 ENCOUNTER — Encounter: Payer: Self-pay | Admitting: Cardiology

## 2010-11-23 ENCOUNTER — Other Ambulatory Visit: Payer: Self-pay | Admitting: *Deleted

## 2010-11-23 DIAGNOSIS — E785 Hyperlipidemia, unspecified: Secondary | ICD-10-CM

## 2010-11-23 MED ORDER — EZETIMIBE 10 MG PO TABS: 10.0000 mg | ORAL_TABLET | Freq: Every day | ORAL | Status: DC

## 2010-11-23 NOTE — Telephone Encounter (Signed)
Refilled meds per fax request.  

## 2010-11-24 ENCOUNTER — Telehealth: Payer: Self-pay | Admitting: Cardiology

## 2010-11-24 ENCOUNTER — Other Ambulatory Visit: Payer: Self-pay | Admitting: *Deleted

## 2010-11-24 DIAGNOSIS — E785 Hyperlipidemia, unspecified: Secondary | ICD-10-CM

## 2010-11-24 MED ORDER — EZETIMIBE 10 MG PO TABS: 10.0000 mg | ORAL_TABLET | Freq: Every day | ORAL | Status: DC

## 2010-11-24 NOTE — Telephone Encounter (Signed)
Agree with plan 

## 2010-11-24 NOTE — Telephone Encounter (Signed)
Refilled zetia.

## 2010-11-24 NOTE — Telephone Encounter (Signed)
Pt calling to report that she had blood in her urine. Pt said this was the first time she noticed blood in her urine, pt also said when she wiped it was on the tissue. Please return pt call to advise/discuss further.

## 2010-11-24 NOTE — Telephone Encounter (Signed)
Has appointment with Dr Isabel Caprice in the morning and in the meantime is going to [facility]

## 2010-11-26 ENCOUNTER — Other Ambulatory Visit: Payer: Self-pay | Admitting: *Deleted

## 2010-11-26 DIAGNOSIS — E785 Hyperlipidemia, unspecified: Secondary | ICD-10-CM

## 2010-11-26 MED ORDER — EZETIMIBE 10 MG PO TABS: 10.0000 mg | ORAL_TABLET | Freq: Every day | ORAL | Status: DC

## 2010-11-26 NOTE — Telephone Encounter (Signed)
Refilled zetia.

## 2010-12-24 ENCOUNTER — Other Ambulatory Visit: Payer: Self-pay | Admitting: *Deleted

## 2010-12-24 DIAGNOSIS — G4700 Insomnia, unspecified: Secondary | ICD-10-CM

## 2010-12-24 NOTE — Telephone Encounter (Signed)
Called pharmacy to ok refill for Lorazepam, PCP just ok'd

## 2011-02-23 ENCOUNTER — Telehealth: Payer: Self-pay | Admitting: Cardiology

## 2011-02-23 DIAGNOSIS — R05 Cough: Secondary | ICD-10-CM

## 2011-02-23 DIAGNOSIS — R059 Cough, unspecified: Secondary | ICD-10-CM

## 2011-02-23 DIAGNOSIS — R058 Other specified cough: Secondary | ICD-10-CM

## 2011-02-23 MED ORDER — AZITHROMYCIN 250 MG PO TABS: ORAL_TABLET | ORAL | Status: AC

## 2011-02-23 NOTE — Telephone Encounter (Signed)
New Msg: Pt calling wanting nurse to call pt. Pt would like Dr. Patty Sermons to write pt a antibiotic. Pt said virus is now in pt chest and pt thinks she might need an antibiotic to help. Please return pt call to discuss further.

## 2011-02-23 NOTE — Telephone Encounter (Signed)
Send the patient a Z-Pak and have her take Mucinex also.

## 2011-02-23 NOTE — Telephone Encounter (Signed)
Productive cough with colored sputum, fever last night none this am.  Bad cough Monday and was up most of night coughing, didn't feel good yesterday.  States she does not think it is the flu.  Wants Rx called into [facility].

## 2011-02-23 NOTE — Telephone Encounter (Signed)
Advised patient

## 2011-04-08 ENCOUNTER — Other Ambulatory Visit: Payer: Self-pay | Admitting: *Deleted

## 2011-04-08 MED ORDER — POTASSIUM CHLORIDE ER 10 MEQ PO TBCR: 10.0000 meq | EXTENDED_RELEASE_TABLET | Freq: Every day | ORAL | Status: DC

## 2011-04-08 MED ORDER — NADOLOL 40 MG PO TABS: 40.0000 mg | ORAL_TABLET | Freq: Every day | ORAL | Status: DC

## 2011-04-08 NOTE — Telephone Encounter (Signed)
Fax Received. Refill Completed. [name] (R.M.A)   

## 2011-04-08 NOTE — Telephone Encounter (Signed)
Fax Received. Refill Completed. Terrall Bley Chowoe (R.M.A)   

## 2011-04-11 ENCOUNTER — Other Ambulatory Visit: Payer: Self-pay | Admitting: *Deleted

## 2011-04-11 MED ORDER — NADOLOL 40 MG PO TABS: 40.0000 mg | ORAL_TABLET | Freq: Every day | ORAL | Status: DC

## 2011-04-11 NOTE — Telephone Encounter (Signed)
Refilled nadolol

## 2011-04-12 ENCOUNTER — Other Ambulatory Visit: Payer: Self-pay | Admitting: *Deleted

## 2011-04-12 MED ORDER — POTASSIUM CHLORIDE ER 10 MEQ PO TBCR: 10.0000 meq | EXTENDED_RELEASE_TABLET | Freq: Every day | ORAL | Status: DC

## 2011-04-13 ENCOUNTER — Other Ambulatory Visit: Payer: Self-pay | Admitting: Cardiology

## 2011-04-13 DIAGNOSIS — G4700 Insomnia, unspecified: Secondary | ICD-10-CM

## 2011-04-13 MED ORDER — TEMAZEPAM 15 MG PO CAPS: ORAL_CAPSULE | ORAL | Status: DC

## 2011-07-05 ENCOUNTER — Telehealth: Payer: Self-pay | Admitting: Cardiology

## 2011-07-05 ENCOUNTER — Other Ambulatory Visit (HOSPITAL_COMMUNITY): Payer: Self-pay | Admitting: Family Medicine

## 2011-07-05 ENCOUNTER — Ambulatory Visit (HOSPITAL_COMMUNITY)
Admission: RE | Admit: 2011-07-05 | Discharge: 2011-07-05 | Disposition: A | Discharge status: Home / Self Care | Payer: Medicare Other | Source: Ambulatory Visit | Attending: Family Medicine | Admitting: Family Medicine

## 2011-07-05 DIAGNOSIS — M25569 Pain in unspecified knee: Secondary | ICD-10-CM

## 2011-07-05 DIAGNOSIS — M25469 Effusion, unspecified knee: Secondary | ICD-10-CM | POA: Insufficient documentation

## 2011-07-08 ENCOUNTER — Other Ambulatory Visit (HOSPITAL_COMMUNITY): Payer: Self-pay | Admitting: Family Medicine

## 2011-07-08 DIAGNOSIS — M25569 Pain in unspecified knee: Secondary | ICD-10-CM

## 2011-07-11 ENCOUNTER — Ambulatory Visit (HOSPITAL_COMMUNITY)
Admission: RE | Admit: 2011-07-11 | Discharge: 2011-07-11 | Disposition: A | Discharge status: Home / Self Care | Payer: Medicare Other | Source: Ambulatory Visit | Attending: Family Medicine | Admitting: Family Medicine

## 2011-07-11 DIAGNOSIS — M25569 Pain in unspecified knee: Secondary | ICD-10-CM

## 2011-07-11 DIAGNOSIS — IMO0002 Reserved for concepts with insufficient information to code with codable children: Secondary | ICD-10-CM | POA: Insufficient documentation

## 2011-07-11 DIAGNOSIS — M1710 Unilateral primary osteoarthritis, unspecified knee: Secondary | ICD-10-CM | POA: Insufficient documentation

## 2011-07-11 DIAGNOSIS — M23302 Other meniscus derangements, unspecified lateral meniscus, unspecified knee: Secondary | ICD-10-CM | POA: Insufficient documentation

## 2011-07-11 DIAGNOSIS — M23319 Other meniscus derangements, anterior horn of medial meniscus, unspecified knee: Secondary | ICD-10-CM | POA: Insufficient documentation

## 2011-07-29 ENCOUNTER — Other Ambulatory Visit: Payer: Self-pay | Admitting: Cardiology

## 2011-07-29 DIAGNOSIS — I10 Essential (primary) hypertension: Secondary | ICD-10-CM

## 2011-07-29 MED ORDER — LOSARTAN POTASSIUM-HCTZ 100-25 MG PO TABS: 1.0000 | ORAL_TABLET | Freq: Every day | ORAL | Status: DC

## 2011-10-07 ENCOUNTER — Other Ambulatory Visit: Payer: Self-pay | Admitting: *Deleted

## 2011-10-07 DIAGNOSIS — I10 Essential (primary) hypertension: Secondary | ICD-10-CM

## 2011-10-07 MED ORDER — LOSARTAN POTASSIUM-HCTZ 100-25 MG PO TABS: 1.0000 | ORAL_TABLET | Freq: Every day | ORAL | Status: DC

## 2011-10-07 NOTE — Telephone Encounter (Signed)
Refilled losartan 

## 2011-11-11 ENCOUNTER — Other Ambulatory Visit: Payer: Self-pay | Admitting: *Deleted

## 2011-11-11 DIAGNOSIS — I10 Essential (primary) hypertension: Secondary | ICD-10-CM

## 2011-11-11 MED ORDER — LOSARTAN POTASSIUM-HCTZ 100-25 MG PO TABS: 1.0000 | ORAL_TABLET | Freq: Every day | ORAL | Status: DC

## 2011-11-11 NOTE — Telephone Encounter (Signed)
Refilled losartan/hctz 

## 2011-12-16 ENCOUNTER — Other Ambulatory Visit: Payer: Self-pay | Admitting: Cardiology

## 2011-12-16 DIAGNOSIS — I10 Essential (primary) hypertension: Secondary | ICD-10-CM

## 2011-12-16 DIAGNOSIS — E785 Hyperlipidemia, unspecified: Secondary | ICD-10-CM

## 2011-12-16 MED ORDER — EZETIMIBE 10 MG PO TABS: 10.0000 mg | ORAL_TABLET | Freq: Every day | ORAL | Status: DC

## 2011-12-16 MED ORDER — LOSARTAN POTASSIUM-HCTZ 100-25 MG PO TABS: 1.0000 | ORAL_TABLET | Freq: Every day | ORAL | Status: DC

## 2012-01-04 ENCOUNTER — Telehealth: Payer: Self-pay | Admitting: Cardiology

## 2012-01-04 NOTE — Telephone Encounter (Signed)
New Problem:    Patient called in wanting how to proceed with getting more refills for her medication.  Please call back.

## 2012-01-04 NOTE — Telephone Encounter (Signed)
Scheduled appointment for patient.

## 2012-02-03 ENCOUNTER — Encounter: Payer: Self-pay | Admitting: Cardiology

## 2012-02-03 ENCOUNTER — Ambulatory Visit (INDEPENDENT_AMBULATORY_CARE_PROVIDER_SITE_OTHER): Payer: Medicare Other | Admitting: Cardiology

## 2012-02-03 VITALS — BP 136/82 | HR 65 | Resp 18 | Ht 62.0 in | Wt 171.8 lb

## 2012-02-03 DIAGNOSIS — E7800 Pure hypercholesterolemia, unspecified: Secondary | ICD-10-CM

## 2012-02-03 DIAGNOSIS — I119 Hypertensive heart disease without heart failure: Secondary | ICD-10-CM

## 2012-02-03 LAB — LIPID PANEL
LDL Cholesterol: 113 mg/dL — ABNORMAL HIGH (ref 0–99)
Total CHOL/HDL Ratio: 4
Triglycerides: 99 mg/dL (ref 0.0–149.0)

## 2012-02-03 LAB — HEPATIC FUNCTION PANEL
ALT: 15 U/L (ref 0–35)
AST: 21 U/L (ref 0–37)
Bilirubin, Direct: 0.1 mg/dL (ref 0.0–0.3)
Total Protein: 7.4 g/dL (ref 6.0–8.3)

## 2012-02-03 LAB — BASIC METABOLIC PANEL
BUN: 14 mg/dL (ref 6–23)
CO2: 33 mEq/L — ABNORMAL HIGH (ref 19–32)
Chloride: 100 mEq/L (ref 96–112)
Creatinine, Ser: 0.8 mg/dL (ref 0.4–1.2)
Potassium: 3.4 mEq/L — ABNORMAL LOW (ref 3.5–5.1)

## 2012-02-03 NOTE — Assessment & Plan Note (Signed)
The patient has a history of hypercholesterolemia.  She is intolerant of statins.  She is able to take ezetimibe and is not having any side effects from it such as myalgias.  Lab work pending

## 2012-02-03 NOTE — Assessment & Plan Note (Signed)
Pressure was remaining stable on current therapy.  She is not having palpitations.  She denies any exertional dyspnea or chest pain.  No dizziness or syncope.  No headaches.

## 2012-02-03 NOTE — Progress Notes (Signed)
[name] Date of Birth:  1933/08/26 [facility] [address] [place], [place]  [address] [phone]  Fax   [phone]  HPI: This pleasant 78 year old woman is seen for a six-month followup office visit. She has a history of essential hypertension. She had a remote history of renal artery angioplasty in 1987 by Dr. Jean Rosenthal. She has not been expressing any chest pain or shortness of breath.  She is watching her diet and has lost 6 pounds since last visit. She does not have any history of known coronary disease. She had a normal LexiScan Cardiolite stress test on 07/14/08 with no ischemia and with an ejection fraction of 76%.   Current Outpatient Prescriptions  Medication Sig Dispense Refill  . Calcium Carbonate-Vitamin D (CALCIUM + D PO) Take by mouth.        . ezetimibe (ZETIA) 10 MG tablet Take 1 tablet (10 mg total) by mouth daily.  30 tablet  5  . hydrOXYzine (ATARAX/VISTARIL) 25 MG tablet       . losartan-hydrochlorothiazide (HYZAAR) 100-25 MG per tablet Take 1 tablet by mouth daily.  30 tablet  6  . nadolol (CORGARD) 40 MG tablet Take 1 tablet (40 mg total) by mouth daily.  90 tablet  3  . temazepam (RESTORIL) 15 MG capsule Take 1 to 2 at bedtime as needed  30 capsule  5  . aspirin 81 MG tablet Take 81 mg by mouth daily.        . pantoprazole (PROTONIX) 40 MG tablet Take 40 mg by mouth daily.        . potassium chloride (KLOR-CON 10) 10 MEQ tablet Take 1 tablet (10 mEq total) by mouth daily.  90 tablet  3    Allergies  Allergen Reactions  . Baycol (Cerivastatin Sodium)   . Ciprofloxacin Hives  . Crestor (Rosuvastatin Calcium)   . Lipitor (Atorvastatin Calcium)   . Morphine And Related   . Pravachol     Patient Active Problem List  Diagnosis  . Benign hypertensive heart disease without heart failure  . Hypercholesterolemia    History  Smoking status  . Former Smoker  . Quit date: 03/15/1987  Smokeless tobacco  . Not on file    History    Alcohol Use No    No family history on file.  Review of Systems: The patient denies any heat or cold intolerance.  No weight gain or weight loss.  The patient denies headaches or blurry vision.  There is no cough or sputum production.  The patient denies dizziness.  There is no hematuria or hematochezia.  The patient denies any muscle aches or arthritis.  The patient denies any rash.  The patient denies frequent falling or instability.  There is no history of depression or anxiety.  All other systems were reviewed and are negative.   Physical Exam: Filed Vitals:   02/03/12 1124  BP: 136/82  Pulse: 65  Resp: 18   general appearance reveals a well-developed well-nourished woman in no distress.The head and neck exam reveals pupils equal and reactive.  Extraocular movements are full.  There is no scleral icterus.  The mouth and pharynx are normal.  The neck is supple.  The carotids reveal no bruits.  The jugular venous pressure is normal.  The  thyroid is not enlarged.  There is no lymphadenopathy.  The chest is clear to percussion and auscultation.  There are no rales or rhonchi.  Expansion of the chest is symmetrical.  The precordium is quiet.  The first heart sound is normal.  The second heart sound is physiologically split.  There is no murmur gallop rub or click.  There is no abnormal lift or heave.  The abdomen is soft and nontender.  The bowel sounds are normal.  The liver and spleen are not enlarged.  There are no abdominal masses.  There are no abdominal bruits.  Extremities reveal good pedal pulses.  There is no phlebitis or edema.  There is no cyanosis or clubbing.  Strength is normal and symmetrical in all extremities.  There is no lateralizing weakness.  There are no sensory deficits.  The skin is warm and dry.  There is no rash.      Assessment / Plan: Continue same medication.  Continue careful diet.  Recheck in 6 months for followup office visit and fasting lab work.

## 2012-02-03 NOTE — Patient Instructions (Addendum)
Your physician recommends that you continue on your current medications as directed. Please refer to the Current Medication list given to you today.  Your physician wants you to follow-up in: 6 months with fasting labs (lp/bmet/hfp)  You will receive a reminder letter in the mail two months in advance. If you don't receive a letter, please call our office to schedule the follow-up appointment.  

## 2012-02-07 ENCOUNTER — Other Ambulatory Visit: Payer: Self-pay

## 2012-02-07 DIAGNOSIS — E785 Hyperlipidemia, unspecified: Secondary | ICD-10-CM

## 2012-02-07 MED ORDER — POTASSIUM CHLORIDE ER 10 MEQ PO TBCR: 10.0000 meq | EXTENDED_RELEASE_TABLET | Freq: Every day | ORAL | Status: DC

## 2012-02-07 MED ORDER — EZETIMIBE 10 MG PO TABS: 10.0000 mg | ORAL_TABLET | Freq: Every day | ORAL | Status: DC

## 2012-02-07 MED ORDER — NADOLOL 40 MG PO TABS: 40.0000 mg | ORAL_TABLET | Freq: Every day | ORAL | Status: DC

## 2012-02-07 NOTE — Telephone Encounter (Signed)
Message copied by Burnell Blanks on Tue Feb 07, 2012  1:10 PM ------      Message from: Cassell Clement      Created: Sun Feb 05, 2012  5:50 PM       LDL higher. Watch diet, continue Zetia.

## 2012-02-07 NOTE — Telephone Encounter (Signed)
Advised patient of lab results  

## 2012-02-28 ENCOUNTER — Other Ambulatory Visit: Payer: Self-pay | Admitting: Cardiology

## 2012-02-28 DIAGNOSIS — I10 Essential (primary) hypertension: Secondary | ICD-10-CM

## 2012-02-28 MED ORDER — LOSARTAN POTASSIUM-HCTZ 100-25 MG PO TABS: 1.0000 | ORAL_TABLET | Freq: Every day | ORAL | Status: DC

## 2012-03-02 ENCOUNTER — Encounter: Payer: Self-pay | Admitting: Cardiology

## 2012-07-25 ENCOUNTER — Other Ambulatory Visit (INDEPENDENT_AMBULATORY_CARE_PROVIDER_SITE_OTHER): Payer: Medicare Other

## 2012-07-25 DIAGNOSIS — E7800 Pure hypercholesterolemia, unspecified: Secondary | ICD-10-CM

## 2012-07-25 LAB — LIPID PANEL
LDL Cholesterol: 102 mg/dL — ABNORMAL HIGH (ref 0–99)
Total CHOL/HDL Ratio: 4
Triglycerides: 105 mg/dL (ref 0.0–149.0)

## 2012-07-25 LAB — HEPATIC FUNCTION PANEL
Albumin: 3.3 g/dL — ABNORMAL LOW (ref 3.5–5.2)
Alkaline Phosphatase: 57 U/L (ref 39–117)
Bilirubin, Direct: 0 mg/dL (ref 0.0–0.3)
Total Bilirubin: 0.6 mg/dL (ref 0.3–1.2)

## 2012-07-25 LAB — BASIC METABOLIC PANEL
CO2: 30 mEq/L (ref 19–32)
Chloride: 103 mEq/L (ref 96–112)
Creatinine, Ser: 0.8 mg/dL (ref 0.4–1.2)
Glucose, Bld: 86 mg/dL (ref 70–99)

## 2012-07-25 NOTE — Progress Notes (Signed)
Quick Note:  Please make copy of labs for patient visit. ______ 

## 2012-07-31 ENCOUNTER — Other Ambulatory Visit: Payer: Medicare Other

## 2012-08-07 ENCOUNTER — Encounter: Payer: Self-pay | Admitting: Cardiology

## 2012-08-07 ENCOUNTER — Ambulatory Visit (INDEPENDENT_AMBULATORY_CARE_PROVIDER_SITE_OTHER): Payer: Medicare Other | Admitting: Cardiology

## 2012-08-07 VITALS — BP 120/76 | HR 72 | Ht 62.0 in | Wt 174.6 lb

## 2012-08-07 DIAGNOSIS — E7800 Pure hypercholesterolemia, unspecified: Secondary | ICD-10-CM

## 2012-08-07 DIAGNOSIS — I119 Hypertensive heart disease without heart failure: Secondary | ICD-10-CM

## 2012-08-07 NOTE — Progress Notes (Signed)
Ofilia Neas Date of Birth:  [birth year] [facility] [address] [place], [place]  [address] [phone]         Fax   [phone]  History of Present Illness: This pleasant 79 year old woman is seen for a six-month followup office visit. She has a history of essential hypertension. She had a remote history of renal artery angioplasty in 1987 by Dr. Jean Rosenthal. She has not been expressing any chest pain or shortness of breath.  She has not been as careful with her diet and her weight is up 3 pounds since last visit. She does not have any history of known coronary disease. She had a normal LexiScan Cardiolite stress test on 07/14/08 with no ischemia and with an ejection fraction of 76%.   Current Outpatient Prescriptions  Medication Sig Dispense Refill  . aspirin 81 MG tablet Take 81 mg by mouth daily.        . Calcium Carbonate-Vitamin D (CALCIUM + D PO) Take by mouth.        . ezetimibe (ZETIA) 10 MG tablet Take 1 tablet (10 mg total) by mouth daily.  90 tablet  4  . hydrOXYzine (ATARAX/VISTARIL) 25 MG tablet       . losartan-hydrochlorothiazide (HYZAAR) 100-25 MG per tablet Take 1 tablet by mouth daily.  90 tablet  3  . nadolol (CORGARD) 40 MG tablet Take 1 tablet (40 mg total) by mouth daily.  90 tablet  4  . OMEPRAZOLE PO Take by mouth daily.      . potassium chloride (KLOR-CON 10) 10 MEQ tablet Take 1 tablet (10 mEq total) by mouth daily.  90 tablet  4  . temazepam (RESTORIL) 15 MG capsule Take 1 to 2 at bedtime as needed  30 capsule  5   No current facility-administered medications for this visit.    Allergies  Allergen Reactions  . Baycol (Cerivastatin Sodium)   . Ciprofloxacin Hives  . Crestor (Rosuvastatin Calcium)   . Lipitor (Atorvastatin Calcium)   . Morphine And Related   . Pravachol     Patient Active Problem List   Diagnosis Date Noted  . Benign hypertensive heart disease without heart failure 08/06/2010  . Hypercholesterolemia 08/06/2010     History  Smoking status  . Former Smoker  . Quit date: 03/15/1987  Smokeless tobacco  . Not on file    History  Alcohol Use No    No family history on file.  Review of Systems: Constitutional: no fever chills diaphoresis or fatigue or change in weight.  Head and neck: no hearing loss, no epistaxis, no photophobia or visual disturbance. Respiratory: No cough, shortness of breath or wheezing. Cardiovascular: No chest pain peripheral edema, palpitations. Gastrointestinal: No abdominal distention, no abdominal pain, no change in bowel habits hematochezia or melena. Genitourinary: No dysuria, no frequency, no urgency, no nocturia. Musculoskeletal:No arthralgias, no back pain, no gait disturbance or myalgias. Neurological: No dizziness, no headaches, no numbness, no seizures, no syncope, no weakness, no tremors. Hematologic: No lymphadenopathy, no easy bruising. Psychiatric: No confusion, no hallucinations, no sleep disturbance.    Physical Exam: Filed Vitals:   08/07/12 1607  BP: 120/76  Pulse: 72   general appearance reveals a well-developed well-nourished woman in no distress.The head and neck exam reveals pupils equal and reactive.  Extraocular movements are full.  There is no scleral icterus.  The mouth and pharynx are normal.  The neck is supple.  The carotids reveal no bruits.  The jugular venous  pressure is normal.  The  thyroid is not enlarged.  There is no lymphadenopathy.  The chest is clear to percussion and auscultation.  There are no rales or rhonchi.  Expansion of the chest is symmetrical.  The precordium is quiet.  The first heart sound is normal.  The second heart sound is physiologically split.  There is no murmur gallop rub or click.  There is no abnormal lift or heave.  The abdomen is soft and nontender.  The bowel sounds are normal.  The liver and spleen are not enlarged.  There are no abdominal masses.  There are no abdominal bruits.  Extremities reveal good  pedal pulses.  There is no phlebitis or edema.  There is no cyanosis or clubbing.  Strength is normal and symmetrical in all extremities.  There is no lateralizing weakness.  There are no sensory deficits.  The skin is warm and dry.  There is no rash.  EKG shows normal sinus rhythm with first degree AV block and otherwise normal EKG.   Assessment / Plan:  Continue present medication.  Recheck in 6 months for office visit and fasting lab work.  Lose weight.

## 2012-08-07 NOTE — Assessment & Plan Note (Signed)
We reviewed her lab work which is satisfactory despite her weight gain.  She is a fair amount of bread and we discussed the importance of cutting back on bread if she hopes to be able to lose weight

## 2012-08-07 NOTE — Assessment & Plan Note (Signed)
Blood pressure was remaining stable on current therapy.  I encouraged her to remain physically active.  Continue same medication.

## 2012-08-07 NOTE — Patient Instructions (Addendum)
Your physician recommends that you continue on your current medications as directed. Please refer to the Current Medication list given to you today.  Your physician wants you to follow-up in: 6 months with fasting labs (lp/bmet/hfp)  You will receive a reminder letter in the mail two months in advance. If you don't receive a letter, please call our office to schedule the follow-up appointment.  

## 2012-10-04 ENCOUNTER — Other Ambulatory Visit (INDEPENDENT_AMBULATORY_CARE_PROVIDER_SITE_OTHER): Payer: Medicare Other

## 2012-10-04 ENCOUNTER — Telehealth: Payer: Self-pay | Admitting: Cardiology

## 2012-10-04 DIAGNOSIS — L659 Nonscarring hair loss, unspecified: Secondary | ICD-10-CM

## 2012-10-04 LAB — T4, FREE: Free T4: 0.8 ng/dL (ref 0.60–1.60)

## 2012-10-04 LAB — TSH: TSH: 2.52 u[IU]/mL (ref 0.35–5.50)

## 2012-10-04 NOTE — Telephone Encounter (Signed)
Lab appointment scheduled for today

## 2012-10-04 NOTE — Telephone Encounter (Signed)
Patient is having hair loss, will get T4 free and TSH for doctor she is seeing in [facility]

## 2012-10-04 NOTE — Telephone Encounter (Signed)
New problem    Pt wants to know if she had thyroid test and if so when

## 2012-10-05 ENCOUNTER — Telehealth: Payer: Self-pay | Admitting: *Deleted

## 2012-10-05 NOTE — Telephone Encounter (Signed)
Message copied by Burnell Blanks on Fri Oct 05, 2012  8:45 AM ------      Message from: Cassell Clement      Created: Thu Oct 04, 2012  8:46 PM       Thyroid levels are normal. ------

## 2012-10-05 NOTE — Telephone Encounter (Signed)
Advised patient of lab results and mailed copy 

## 2013-01-18 ENCOUNTER — Telehealth: Payer: Self-pay | Admitting: *Deleted

## 2013-01-18 NOTE — Telephone Encounter (Signed)
Pharmacy called to inform us that nadolol 40mg  is not available right now, and they want to know if there is something that they can replace it with. Please advise. Thanks, MI

## 2013-01-19 NOTE — Telephone Encounter (Signed)
Use nadolol 20 mg two tablets if they are available.

## 2013-01-21 ENCOUNTER — Other Ambulatory Visit: Payer: Self-pay | Admitting: *Deleted

## 2013-01-21 MED ORDER — METOPROLOL SUCCINATE ER 25 MG PO TB24: 25.0000 mg | ORAL_TABLET | Freq: Every day | ORAL | Status: DC

## 2013-01-21 NOTE — Telephone Encounter (Signed)
If nadalol no longer available switch to Toprol XL 25 mg daily generic

## 2013-01-21 NOTE — Telephone Encounter (Signed)
Dr. Patty Sermons, I spoke with the pharmacist and they stated that Nadolol is also unavailable in 20mg . She thinks that they are not going to be manufacturing it any longer. Please advise. Thank you, MI

## 2013-01-21 NOTE — Telephone Encounter (Signed)
Patient aware of Dr Jenness Corner recommendation to switch to Toprol XL 25mg  qd. Rx sent to [facility].

## 2013-02-12 ENCOUNTER — Ambulatory Visit: Payer: Medicare Other | Admitting: Cardiology

## 2013-02-22 ENCOUNTER — Ambulatory Visit (INDEPENDENT_AMBULATORY_CARE_PROVIDER_SITE_OTHER): Payer: Medicare Other | Admitting: Cardiology

## 2013-02-22 ENCOUNTER — Encounter: Payer: Self-pay | Admitting: Cardiology

## 2013-02-22 VITALS — BP 162/89 | HR 65 | Ht 62.0 in | Wt 173.0 lb

## 2013-02-22 DIAGNOSIS — E7800 Pure hypercholesterolemia, unspecified: Secondary | ICD-10-CM

## 2013-02-22 DIAGNOSIS — I119 Hypertensive heart disease without heart failure: Secondary | ICD-10-CM

## 2013-02-22 NOTE — Assessment & Plan Note (Signed)
Her blood pressure today was a little high on arrival.  She had been rushing around a lot to get here.  I rechecked it it was down to 146/82.  She will monitor it at home.  We will not change her medication at this time.  She has not been on any chest pain or shortness of breath palpitations dizziness or syncope.

## 2013-02-22 NOTE — Patient Instructions (Signed)
Your physician recommends that you continue on your current medications as directed. Please refer to the Current Medication list given to you today.  Your physician wants you to follow-up in: 6 months with fasting labs (lp/bmet/hfp)  You will receive a reminder letter in the mail two months in advance. If you don't receive a letter, please call our office to schedule the follow-up appointment.  

## 2013-02-22 NOTE — Progress Notes (Signed)
[name] Date of Birth:  12/28/1933 [address] [place], [place]  [address] [phone]         Fax   [phone]  History of Present Illness: This pleasant 79 year old woman is seen for a six-month followup office visit. She has a history of essential hypertension. She had a remote history of renal artery angioplasty in 1987 by Dr. Jean Rosenthal. She has not been expressing any chest pain or shortness of breath.    She does not have any history of known coronary disease. She had a normal LexiScan Cardiolite stress test on 07/14/08 with no ischemia and with an ejection fraction of 76%.   Current Outpatient Prescriptions  Medication Sig Dispense Refill  . ezetimibe (ZETIA) 10 MG tablet Take 1 tablet (10 mg total) by mouth daily.  90 tablet  4  . hydrOXYzine (ATARAX/VISTARIL) 25 MG tablet       . losartan-hydrochlorothiazide (HYZAAR) 100-25 MG per tablet Take 1 tablet by mouth daily.  90 tablet  3  . metoprolol succinate (TOPROL XL) 25 MG 24 hr tablet Take 1 tablet (25 mg total) by mouth daily.  30 tablet  0  . OMEPRAZOLE PO Take by mouth daily.      . potassium chloride (KLOR-CON 10) 10 MEQ tablet Take 1 tablet (10 mEq total) by mouth daily.  90 tablet  4  . temazepam (RESTORIL) 15 MG capsule Take 1 to 2 at bedtime as needed  30 capsule  5  . nadolol (CORGARD) 40 MG tablet Take 1 tablet (40 mg total) by mouth daily.  90 tablet  4   No current facility-administered medications for this visit.    Allergies  Allergen Reactions  . Baycol [Cerivastatin Sodium]   . Ciprofloxacin Hives  . Crestor [Rosuvastatin Calcium]   . Lipitor [Atorvastatin Calcium]   . Morphine And Related   . Pravachol     Patient Active Problem List   Diagnosis Date Noted  . Benign hypertensive heart disease without heart failure 08/06/2010  . Hypercholesterolemia 08/06/2010    History  Smoking status  . Former Smoker  . Quit date: 03/15/1987  Smokeless tobacco  . Not on file     History  Alcohol Use No    History reviewed. No pertinent family history.  Review of Systems: Constitutional: no fever chills diaphoresis or fatigue or change in weight.  Head and neck: no hearing loss, no epistaxis, no photophobia or visual disturbance. Respiratory: No cough, shortness of breath or wheezing. Cardiovascular: No chest pain peripheral edema, palpitations. Gastrointestinal: No abdominal distention, no abdominal pain, no change in bowel habits hematochezia or melena. Genitourinary: No dysuria, no frequency, no urgency, no nocturia. Musculoskeletal:No arthralgias, no back pain, no gait disturbance or myalgias. Neurological: No dizziness, no headaches, no numbness, no seizures, no syncope, no weakness, no tremors. Hematologic: No lymphadenopathy, no easy bruising. Psychiatric: No confusion, no hallucinations, no sleep disturbance.    Physical Exam: Filed Vitals:   02/22/13 1131  BP: 162/89  Pulse: 65   general appearance reveals a well-developed well-nourished woman in no distress.The head and neck exam reveals pupils equal and reactive.  Extraocular movements are full.  There is no scleral icterus.  The mouth and pharynx are normal.  The neck is supple.  The carotids reveal no bruits.  The jugular venous pressure is normal.  The  thyroid is not enlarged.  There is no lymphadenopathy.  The chest is clear to percussion and auscultation.  There are no  rales or rhonchi.  Expansion of the chest is symmetrical.  The precordium is quiet.  The first heart sound is normal.  The second heart sound is physiologically split.  There is no murmur gallop rub or click.  There is no abnormal lift or heave.  The abdomen is soft and nontender.  The bowel sounds are normal.  The liver and spleen are not enlarged.  There are no abdominal masses.  There are no abdominal bruits.  Extremities reveal good pedal pulses.  There is no phlebitis or edema.  There is no cyanosis or clubbing.  Strength is  normal and symmetrical in all extremities.  There is no lateralizing weakness.  There are no sensory deficits.  The skin is warm and dry.  There is no rash.     Assessment / Plan:  Continue present medication.  Recheck in 6 months for office visit and fasting lab work and EKG.  Continue on prudent weight loss diet.

## 2013-02-22 NOTE — Assessment & Plan Note (Signed)
The patient has a history of hypercholesterolemia.  She did not come in fasting today.  She is on ezetimibe.  We will plan to recheck her fasting lipids at her next visit.  Her weight is down 1 pound since last visit

## 2013-03-13 ENCOUNTER — Other Ambulatory Visit: Payer: Self-pay | Admitting: Cardiology

## 2013-03-18 DIAGNOSIS — Z1231 Encounter for screening mammogram for malignant neoplasm of breast: Secondary | ICD-10-CM | POA: Diagnosis not present

## 2013-03-30 ENCOUNTER — Other Ambulatory Visit: Payer: Self-pay | Admitting: Cardiology

## 2013-04-08 DIAGNOSIS — L659 Nonscarring hair loss, unspecified: Secondary | ICD-10-CM | POA: Diagnosis not present

## 2013-04-24 ENCOUNTER — Other Ambulatory Visit: Payer: Self-pay

## 2013-04-24 MED ORDER — METOPROLOL SUCCINATE ER 25 MG PO TB24: ORAL_TABLET | ORAL | Status: DC

## 2013-04-26 ENCOUNTER — Encounter: Payer: Self-pay | Admitting: Cardiology

## 2013-05-10 ENCOUNTER — Other Ambulatory Visit: Payer: Self-pay | Admitting: Cardiology

## 2013-06-10 ENCOUNTER — Other Ambulatory Visit: Payer: Self-pay | Admitting: Cardiology

## 2013-06-24 DIAGNOSIS — L659 Nonscarring hair loss, unspecified: Secondary | ICD-10-CM | POA: Diagnosis not present

## 2013-08-28 ENCOUNTER — Other Ambulatory Visit: Payer: Medicare Other

## 2013-08-28 ENCOUNTER — Ambulatory Visit: Payer: Medicare Other | Admitting: Cardiology

## 2013-09-09 DIAGNOSIS — L659 Nonscarring hair loss, unspecified: Secondary | ICD-10-CM | POA: Diagnosis not present

## 2013-09-17 ENCOUNTER — Ambulatory Visit (INDEPENDENT_AMBULATORY_CARE_PROVIDER_SITE_OTHER): Payer: Medicare Other | Admitting: *Deleted

## 2013-09-17 DIAGNOSIS — E7800 Pure hypercholesterolemia, unspecified: Secondary | ICD-10-CM

## 2013-09-17 DIAGNOSIS — I119 Hypertensive heart disease without heart failure: Secondary | ICD-10-CM

## 2013-09-17 LAB — BASIC METABOLIC PANEL
BUN: 12 mg/dL (ref 6–23)
CHLORIDE: 100 meq/L (ref 96–112)
CO2: 34 meq/L — AB (ref 19–32)
CREATININE: 0.9 mg/dL (ref 0.4–1.2)
Calcium: 9.3 mg/dL (ref 8.4–10.5)
GFR: 68.72 mL/min (ref >60.00)
Glucose, Bld: 108 mg/dL — ABNORMAL HIGH (ref 70–99)
Potassium: 4 mEq/L (ref 3.5–5.1)
SODIUM: 139 meq/L (ref 135–145)

## 2013-09-17 LAB — LIPID PANEL
Cholesterol: 180 mg/dL (ref 0–200)
HDL: 50.8 mg/dL (ref >39.00)
LDL Cholesterol: 109 mg/dL — ABNORMAL HIGH (ref 0–99)
NonHDL: 129.2
Total CHOL/HDL Ratio: 4
Triglycerides: 101 mg/dL (ref 0.0–149.0)
VLDL: 20.2 mg/dL (ref 0.0–40.0)

## 2013-09-17 LAB — HEPATIC FUNCTION PANEL
ALBUMIN: 3.6 g/dL (ref 3.5–5.2)
ALT: 14 U/L (ref 0–35)
AST: 20 U/L (ref 0–37)
Alkaline Phosphatase: 58 U/L (ref 39–117)
Bilirubin, Direct: 0.1 mg/dL (ref 0.0–0.3)
TOTAL PROTEIN: 7.1 g/dL (ref 6.0–8.3)
Total Bilirubin: 0.7 mg/dL (ref 0.2–1.2)

## 2013-09-18 ENCOUNTER — Ambulatory Visit: Payer: Medicare Other | Admitting: Cardiology

## 2013-09-18 ENCOUNTER — Other Ambulatory Visit: Payer: Medicare Other

## 2013-09-18 NOTE — Progress Notes (Signed)
Quick Note:  Please report to patient. The recent labs are stable. Continue same medication and careful diet. Discuss further at August office visit. ______

## 2013-09-26 ENCOUNTER — Other Ambulatory Visit: Payer: Self-pay | Admitting: Cardiology

## 2013-09-26 NOTE — Telephone Encounter (Signed)
Close Encounter 

## 2013-10-11 DIAGNOSIS — L259 Unspecified contact dermatitis, unspecified cause: Secondary | ICD-10-CM | POA: Diagnosis not present

## 2013-10-11 DIAGNOSIS — J309 Allergic rhinitis, unspecified: Secondary | ICD-10-CM | POA: Diagnosis not present

## 2013-10-25 ENCOUNTER — Ambulatory Visit (INDEPENDENT_AMBULATORY_CARE_PROVIDER_SITE_OTHER): Payer: Medicare Other | Admitting: Cardiology

## 2013-10-25 ENCOUNTER — Encounter: Payer: Self-pay | Admitting: Cardiology

## 2013-10-25 VITALS — BP 116/67 | HR 86 | Ht 62.5 in | Wt 176.0 lb

## 2013-10-25 DIAGNOSIS — E7800 Pure hypercholesterolemia, unspecified: Secondary | ICD-10-CM

## 2013-10-25 DIAGNOSIS — Z789 Other specified health status: Secondary | ICD-10-CM | POA: Diagnosis not present

## 2013-10-25 DIAGNOSIS — Z889 Allergy status to unspecified drugs, medicaments and biological substances status: Secondary | ICD-10-CM

## 2013-10-25 DIAGNOSIS — I119 Hypertensive heart disease without heart failure: Secondary | ICD-10-CM

## 2013-10-25 DIAGNOSIS — Z9189 Other specified personal risk factors, not elsewhere classified: Secondary | ICD-10-CM | POA: Insufficient documentation

## 2013-10-25 NOTE — Assessment & Plan Note (Signed)
The patient today mentioned 2 additional things that she is allergic to.  These are contact allergens.  She is allergic to propylene glycol and that she is allergic to balsam  of F[place]iper[place].

## 2013-10-25 NOTE — Patient Instructions (Signed)
STOP NADOLOL   Your physician wants you to follow-up in: 6 months with fasting labs (lp/bmet/hfp) You will receive a reminder letter in the mail two months in advance. If you don't receive a letter, please call our office to schedule the follow-up appointment.

## 2013-10-25 NOTE — Assessment & Plan Note (Signed)
The patient has not been as careful with her diet.  Weight is up 3 pounds.

## 2013-10-25 NOTE — Progress Notes (Signed)
[name] Date of Birth:  04/29/1933 [facility] [address] [place], [place]  [id number] [phone]        Fax   [phone]   History of Present Illness: This pleasant 80 year old woman is seen for a six-month followup office visit. She has a history of essential hypertension. She had a remote history of renal artery angioplasty in 1987 by Dr. Jean RosenthalJackson. She has not been expressing any chest pain or shortness of breath. She does not have any history of known coronary disease. She had a normal LexiScan Cardiolite stress test on 07/14/08 with no ischemia and with an ejection fraction of 76%.  Since last visit she has been feeling well with no new cardiac symptoms.   Current Outpatient Prescriptions  Medication Sig Dispense Refill  . losartan-hydrochlorothiazide (HYZAAR) 100-25 MG per tablet TAKE ONE (1) TABLET BY MOUTH EVERY DAY  90 tablet  1  . metoprolol succinate (TOPROL-XL) 25 MG 24 hr tablet TAKE ONE (1) TABLET BY MOUTH EVERY DAY  30 tablet  6  . OMEPRAZOLE PO Take by mouth daily.      . potassium chloride (K-DUR) 10 MEQ tablet TAKE ONE (1) TABLET EACH DAY  90 tablet  0  . temazepam (RESTORIL) 15 MG capsule Take 1 to 2 at bedtime as needed  30 capsule  5  . ZETIA 10 MG tablet TAKE ONE (1) TABLET EACH DAY  90 tablet  1  . hydrOXYzine (ATARAX/VISTARIL) 25 MG tablet        No current facility-administered medications for this visit.    Allergies  Allergen Reactions  . Baycol [Cerivastatin Sodium]   . Ciprofloxacin Hives  . Crestor [Rosuvastatin Calcium]   . Lipitor [Atorvastatin Calcium]   . Morphine And Related   . Pravachol   . Propylene Glycol     Severe itching    Patient Active Problem List   Diagnosis Date Noted  . Benign hypertensive heart disease without heart failure 08/06/2010  . Hypercholesterolemia 08/06/2010    History  Smoking status  . Former Smoker  . Quit date: 03/15/1987  Smokeless tobacco  . Not on file    History    Alcohol Use No    No family history on file.  Review of Systems: Constitutional: no fever chills diaphoresis or fatigue or change in weight.  Head and neck: no hearing loss, no epistaxis, no photophobia or visual disturbance. Respiratory: No cough, shortness of breath or wheezing. Cardiovascular: No chest pain peripheral edema, palpitations. Gastrointestinal: No abdominal distention, no abdominal pain, no change in bowel habits hematochezia or melena. Genitourinary: No dysuria, no frequency, no urgency, no nocturia. Musculoskeletal:No arthralgias, no back pain, no gait disturbance or myalgias. Neurological: No dizziness, no headaches, no numbness, no seizures, no syncope, no weakness, no tremors. Hematologic: No lymphadenopathy, no easy bruising. Psychiatric: No confusion, no hallucinations, no sleep disturbance.    Physical Exam: Filed Vitals:   10/25/13 1501  BP: 116/67  Pulse: 86   the general appearance is that of a well-developed well-nourished woman in no distress.The head and neck exam reveals pupils equal and reactive.  Extraocular movements are full.  There is no scleral icterus.  The mouth and pharynx are normal.  The neck is supple.  The carotids reveal no bruits.  The jugular venous pressure is normal.  The  thyroid is not enlarged.  There is no lymphadenopathy.  The chest is clear to percussion and auscultation.  There are no rales  or rhonchi.  Expansion of the chest is symmetrical.  The precordium is quiet.  The first heart sound is normal.  The second heart sound is physiologically split.  There is no murmur gallop rub or click.  There is no abnormal lift or heave.  The abdomen is soft and nontender.  The bowel sounds are normal.  The liver and spleen are not enlarged.  There are no abdominal masses.  There are no abdominal bruits.  Extremities reveal good pedal pulses.  There is no phlebitis or edema.  There is no cyanosis or clubbing.  Strength is normal and symmetrical in  all extremities.  There is no lateralizing weakness.  There are no sensory deficits.  The skin is warm and dry.  There is no rash.  EKG shows normal sinus rhythm with nonspecific T-wave abnormality      Assessment / Plan: 1. essential hypertension with remote history of renal artery angioplasty 2. Dyslipidemia  Disposition continue on current medication.  Recheck in 6 months for office visit lipid panel hepatic function panel and basal metabolic panel.  Work harder on exercise and weight loss.

## 2013-10-25 NOTE — Assessment & Plan Note (Signed)
Blood pressure was remaining stable on current therapy.  She's not been having any palpitations or chest pain.  No dizziness or syncope

## 2013-10-31 ENCOUNTER — Other Ambulatory Visit: Payer: Self-pay | Admitting: Cardiology

## 2013-12-12 ENCOUNTER — Other Ambulatory Visit: Payer: Self-pay | Admitting: Cardiology

## 2014-01-18 DIAGNOSIS — Z23 Encounter for immunization: Secondary | ICD-10-CM | POA: Diagnosis not present

## 2014-02-13 ENCOUNTER — Telehealth: Payer: Self-pay | Admitting: Cardiology

## 2014-02-13 NOTE — Telephone Encounter (Signed)
New message  Pt called requests a call back to determine if she has had her pneumonia shot. Sent pt to med rec

## 2014-03-25 DIAGNOSIS — Z1231 Encounter for screening mammogram for malignant neoplasm of breast: Secondary | ICD-10-CM | POA: Diagnosis not present

## 2014-03-28 ENCOUNTER — Other Ambulatory Visit: Payer: Self-pay | Admitting: Cardiology

## 2014-04-09 ENCOUNTER — Encounter: Payer: Self-pay | Admitting: Cardiology

## 2014-04-16 ENCOUNTER — Other Ambulatory Visit: Payer: Self-pay | Admitting: Cardiology

## 2014-04-18 ENCOUNTER — Ambulatory Visit (HOSPITAL_COMMUNITY)
Admission: RE | Admit: 2014-04-18 | Discharge: 2014-04-18 | Disposition: A | Discharge status: Home / Self Care | Payer: Medicare Other | Source: Ambulatory Visit | Attending: Family Medicine | Admitting: Family Medicine

## 2014-04-18 ENCOUNTER — Other Ambulatory Visit (HOSPITAL_COMMUNITY): Payer: Self-pay | Admitting: Family Medicine

## 2014-04-18 DIAGNOSIS — M25561 Pain in right knee: Secondary | ICD-10-CM | POA: Diagnosis not present

## 2014-04-18 DIAGNOSIS — W1830XA Fall on same level, unspecified, initial encounter: Secondary | ICD-10-CM | POA: Insufficient documentation

## 2014-04-18 DIAGNOSIS — Z9181 History of falling: Secondary | ICD-10-CM

## 2014-04-18 DIAGNOSIS — M25461 Effusion, right knee: Secondary | ICD-10-CM | POA: Insufficient documentation

## 2014-04-18 DIAGNOSIS — M7989 Other specified soft tissue disorders: Secondary | ICD-10-CM | POA: Diagnosis not present

## 2014-04-18 DIAGNOSIS — S8991XA Unspecified injury of right lower leg, initial encounter: Secondary | ICD-10-CM | POA: Diagnosis not present

## 2014-04-18 DIAGNOSIS — S8001XA Contusion of right knee, initial encounter: Secondary | ICD-10-CM | POA: Diagnosis not present

## 2014-05-09 ENCOUNTER — Other Ambulatory Visit (INDEPENDENT_AMBULATORY_CARE_PROVIDER_SITE_OTHER): Payer: Medicare Other | Admitting: *Deleted

## 2014-05-09 DIAGNOSIS — E7800 Pure hypercholesterolemia, unspecified: Secondary | ICD-10-CM

## 2014-05-09 DIAGNOSIS — I119 Hypertensive heart disease without heart failure: Secondary | ICD-10-CM | POA: Diagnosis not present

## 2014-05-09 LAB — LIPID PANEL
CHOLESTEROL: 170 mg/dL (ref 0–200)
HDL: 45.1 mg/dL (ref >39.00)
LDL Cholesterol: 101 mg/dL — ABNORMAL HIGH (ref 0–99)
NONHDL: 124.9
TRIGLYCERIDES: 122 mg/dL (ref 0.0–149.0)
Total CHOL/HDL Ratio: 4
VLDL: 24.4 mg/dL (ref 0.0–40.0)

## 2014-05-09 LAB — BASIC METABOLIC PANEL
BUN: 17 mg/dL (ref 6–23)
CO2: 35 mEq/L — ABNORMAL HIGH (ref 19–32)
Calcium: 9.5 mg/dL (ref 8.4–10.5)
Chloride: 101 mEq/L (ref 96–112)
Creatinine, Ser: 0.93 mg/dL (ref 0.40–1.20)
GFR: 61.84 mL/min (ref >60.00)
GLUCOSE: 112 mg/dL — AB (ref 70–99)
Potassium: 4 mEq/L (ref 3.5–5.1)
Sodium: 140 mEq/L (ref 135–145)

## 2014-05-09 LAB — HEPATIC FUNCTION PANEL
ALT: 12 U/L (ref 0–35)
AST: 15 U/L (ref 0–37)
Albumin: 3.8 g/dL (ref 3.5–5.2)
Alkaline Phosphatase: 100 U/L (ref 39–117)
Bilirubin, Direct: 0.1 mg/dL (ref 0.0–0.3)
TOTAL PROTEIN: 7.3 g/dL (ref 6.0–8.3)
Total Bilirubin: 0.7 mg/dL (ref 0.2–1.2)

## 2014-05-11 NOTE — Progress Notes (Signed)
Quick Note:  Please make copy of labs for patient visit. ______ 

## 2014-05-13 ENCOUNTER — Other Ambulatory Visit: Payer: Medicare Other

## 2014-05-14 ENCOUNTER — Other Ambulatory Visit: Payer: Medicare Other

## 2014-05-20 ENCOUNTER — Ambulatory Visit (INDEPENDENT_AMBULATORY_CARE_PROVIDER_SITE_OTHER): Payer: Medicare Other | Admitting: Cardiology

## 2014-05-20 ENCOUNTER — Encounter: Payer: Self-pay | Admitting: Cardiology

## 2014-05-20 VITALS — BP 112/68 | HR 70 | Ht 62.0 in | Wt 179.6 lb

## 2014-05-20 DIAGNOSIS — I119 Hypertensive heart disease without heart failure: Secondary | ICD-10-CM

## 2014-05-20 DIAGNOSIS — E7800 Pure hypercholesterolemia, unspecified: Secondary | ICD-10-CM

## 2014-05-20 DIAGNOSIS — Z889 Allergy status to unspecified drugs, medicaments and biological substances status: Secondary | ICD-10-CM

## 2014-05-20 DIAGNOSIS — Z9189 Other specified personal risk factors, not elsewhere classified: Secondary | ICD-10-CM

## 2014-05-20 NOTE — Patient Instructions (Signed)
Your physician recommends that you continue on your current medications as directed. Please refer to the Current Medication list given to you today.  Your physician wants you to follow-up in: 6 months with fasting labs (lp/bmet/hfp) and ekg  You will receive a reminder letter in the mail two months in advance. If you don't receive a letter, please call our office to schedule the follow-up appointment.  

## 2014-05-20 NOTE — Progress Notes (Signed)
Cardiology Office Note   Date:  05/20/2014   ID:  Kelsey NeasGail C Murdock, DOB 12/24/1933, MRN [id number]  PCP:  Alice ReichertMCINNIS,ANGUS G, MD  Cardiologist:   Cassell Clementhomas Dorathy Stallone, MD   No chief complaint on file.     History of Present Illness: [name] is a 81 y.o. female who presents for a six-month follow-up office visit.  This pleasant 81 year old woman is seen for a six-month followup office visit. She has a history of essential hypertension. She had a remote history of renal artery angioplasty in 1987 by Dr. Jean RosenthalJackson. She has not been expressing any chest pain or shortness of breath. She does not have any history of known coronary disease. She had a normal LexiScan Cardiolite stress test on 07/14/08 with no ischemia and with an ejection fraction of 76%. Since last visit she has been feeling well with no new cardiac symptoms.  She has not been as careful with her diet.  Her weight is up and her blood sugar and cholesterol or higher.   5 weeks ago she fell and injured her right leg.  This has cut back on her exercise even further but even before that she was not getting much regular exercise.   Past Medical History  Diagnosis Date  . Hypercholesterolemia   . Hypertension 1984    renal artery  . Fibrocystic breast disease   . H/O: hysterectomy     Past Surgical History  Procedure Laterality Date  . Breast biopsies      for benign lumps  . Vein ligation  1974    both legs  . Tonsillectomy    . Breast lumpectomy  1985  . Belpharoptosis repair    . Total hip arthroplasty      right  . Renal artery angioplasty  1987    right Dr. Jean RosenthalJackson     Current Outpatient Prescriptions  Medication Sig Dispense Refill  . losartan-hydrochlorothiazide (HYZAAR) 100-25 MG per tablet TAKE ONE (1) TABLET BY MOUTH EVERY DAY 90 tablet 0  . metoprolol succinate (TOPROL-XL) 25 MG 24 hr tablet TAKE ONE (1) TABLET BY MOUTH EVERY DAY 30 tablet 6  . OMEPRAZOLE PO Take by mouth daily.    . potassium chloride (K-DUR)  10 MEQ tablet TAKE ONE (1) TABLET EACH DAY 90 tablet 0  . temazepam (RESTORIL) 15 MG capsule Take 1 to 2 at bedtime as needed 30 capsule 5  . ZETIA 10 MG tablet TAKE ONE (1) TABLET EACH DAY 90 tablet 1   No current facility-administered medications for this visit.    Allergies:   Baycol; Ciprofloxacin; Crestor; Lipitor; Morphine and related; Pravachol; and Propylene glycol    Social History:  The patient  reports that she quit smoking about 27 years ago. She does not have any smokeless tobacco history on file. She reports that she does not drink alcohol.   Family History:  The patient's family history is not on file.    ROS:  Please see the history of present illness.   Otherwise, review of systems are positive for none.   All other systems are reviewed and negative.    PHYSICAL EXAM: VS:  BP 112/68 mmHg  Pulse 70  Ht 5\' 2"  (1.575 m)  Wt 179 lb 9.6 oz (81.466 kg)  BMI 32.84 kg/m2 , BMI Body mass index is 32.84 kg/(m^2). GEN: Well nourished, well developed, in no acute distress HEENT: normal Neck: no JVD, carotid bruits, or masses Cardiac: RRR; no murmurs, rubs, or gallops,no edema  Respiratory:  clear to auscultation bilaterally, normal work of breathing GI: soft, nontender, nondistended, + BS MS: no deformity or atrophy Skin: warm and dry, no rash Neuro:  Strength and sensation are intact Psych: euthymic mood, full affect   EKG:  EKG is not ordered today.    Recent Labs: 05/09/2014: ALT 12; BUN 17; Creatinine 0.93; Potassium 4.0; Sodium 140    Lipid Panel    Component Value Date/Time   CHOL 170 05/09/2014 0944   TRIG 122.0 05/09/2014 0944   HDL 45.10 05/09/2014 0944   CHOLHDL 4 05/09/2014 0944   VLDL 24.4 05/09/2014 0944   LDLCALC 101* 05/09/2014 0944      Wt Readings from Last 3 Encounters:  05/20/14 179 lb 9.6 oz (81.466 kg)  10/25/13 176 lb (79.833 kg)  02/22/13 173 lb (78.472 kg)         ASSESSMENT AND PLAN:  1.  essential hypertension without  heart failure.  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   2.  Hyperglycemia  3.  Dyslipidemia    Disposition: Continue on same medication.  Work harder on diet and avoiding bread and carbohydrates.  Recheck in 6 months for office visit EKG lipid panel hepatic function panel and basal metabolic panel   Current medicines are reviewed at length with the patient today.  The patient does not have concerns regarding medicines.  The following changes have been made:  no change   Orders Placed This Encounter  Procedures  . Lipid panel  . Hepatic function panel  . Basic metabolic panel     Disposition: Recheck in 6 months for office visit EKG and fasting lab work.  Signed, Cassell Clement, MD  05/20/2014 5:44 PM    [facility] [address] [place], [place], [place]  [address] Phone: [phone]; Fax: [phone]

## 2014-05-23 DIAGNOSIS — M1711 Unilateral primary osteoarthritis, right knee: Secondary | ICD-10-CM | POA: Diagnosis not present

## 2014-06-24 ENCOUNTER — Other Ambulatory Visit: Payer: Self-pay | Admitting: Cardiology

## 2014-07-10 ENCOUNTER — Other Ambulatory Visit: Payer: Self-pay | Admitting: Cardiology

## 2014-07-27 ENCOUNTER — Other Ambulatory Visit: Payer: Self-pay | Admitting: Cardiology

## 2014-09-08 ENCOUNTER — Other Ambulatory Visit: Payer: Self-pay

## 2014-10-21 ENCOUNTER — Telehealth: Payer: Self-pay | Admitting: Cardiology

## 2014-10-21 NOTE — Telephone Encounter (Signed)
Left message to call back  

## 2014-10-21 NOTE — Telephone Encounter (Signed)
New Message  Pt requested to speak w/ RN about a letter she received concerning Dr Patty Sermons. No such letter was listed in her chart. Pt insisted it was with our office. Please call back and discuss.

## 2014-10-22 NOTE — Telephone Encounter (Signed)
Talked to patient and she was unsure what letter was  Advised our office did not send a letter just disregard

## 2014-10-22 NOTE — Telephone Encounter (Signed)
Left message to call back  

## 2014-10-22 NOTE — Telephone Encounter (Signed)
F/U        Pt returning Melinda's phone call.

## 2014-11-19 ENCOUNTER — Emergency Department (HOSPITAL_COMMUNITY)
Admission: EM | Admit: 2014-11-19 | Discharge: 2014-11-19 | Disposition: A | Discharge status: Home / Self Care | Payer: Medicare Other | Attending: Emergency Medicine | Admitting: Emergency Medicine

## 2014-11-19 ENCOUNTER — Encounter (HOSPITAL_COMMUNITY): Payer: Self-pay | Admitting: *Deleted

## 2014-11-19 DIAGNOSIS — S61011A Laceration without foreign body of right thumb without damage to nail, initial encounter: Secondary | ICD-10-CM | POA: Insufficient documentation

## 2014-11-19 DIAGNOSIS — Y998 Other external cause status: Secondary | ICD-10-CM | POA: Insufficient documentation

## 2014-11-19 DIAGNOSIS — I10 Essential (primary) hypertension: Secondary | ICD-10-CM | POA: Diagnosis not present

## 2014-11-19 DIAGNOSIS — Z23 Encounter for immunization: Secondary | ICD-10-CM | POA: Insufficient documentation

## 2014-11-19 DIAGNOSIS — Y288XXA Contact with other sharp object, undetermined intent, initial encounter: Secondary | ICD-10-CM | POA: Insufficient documentation

## 2014-11-19 DIAGNOSIS — Y9289 Other specified places as the place of occurrence of the external cause: Secondary | ICD-10-CM | POA: Insufficient documentation

## 2014-11-19 DIAGNOSIS — Y9389 Activity, other specified: Secondary | ICD-10-CM | POA: Insufficient documentation

## 2014-11-19 DIAGNOSIS — Z8639 Personal history of other endocrine, nutritional and metabolic disease: Secondary | ICD-10-CM | POA: Insufficient documentation

## 2014-11-19 DIAGNOSIS — Z79899 Other long term (current) drug therapy: Secondary | ICD-10-CM | POA: Insufficient documentation

## 2014-11-19 DIAGNOSIS — Z87891 Personal history of nicotine dependence: Secondary | ICD-10-CM | POA: Insufficient documentation

## 2014-11-19 DIAGNOSIS — Z8742 Personal history of other diseases of the female genital tract: Secondary | ICD-10-CM | POA: Insufficient documentation

## 2014-11-19 MED ORDER — LIDOCAINE HCL (PF) 1 % IJ SOLN
5.0000 mL | Freq: Once | INTRAMUSCULAR | Status: DC
  Filled 2014-11-19: qty 5

## 2014-11-19 MED ORDER — TETANUS-DIPHTH-ACELL PERTUSSIS 5-2.5-18.5 LF-MCG/0.5 IM SUSP
0.5000 mL | Freq: Once | INTRAMUSCULAR | Status: AC
  Administered 2014-11-19: 0.5 mL via INTRAMUSCULAR
  Filled 2014-11-19: qty 0.5

## 2014-11-19 MED ORDER — TETANUS-DIPHTHERIA TOXOIDS TD 5-2 LFU IM INJ: 0.5000 mL | INJECTION | Freq: Once | INTRAMUSCULAR | Status: DC

## 2014-11-19 NOTE — ED Provider Notes (Signed)
  Face-to-face evaluation   History: She cut her  finger on a paper cutter at work.  Physical exam: Left thumb with gaping laceration of the pad. Laceration, nonbleeding. No association of nail injury.  Medical screening examination/treatment/procedure(s) were conducted as a shared visit with non-physician practitioner(s) and myself.  I personally evaluated the patient during the encounter  Mancel Bale, MD 11/30/14 [phone]

## 2014-11-19 NOTE — Discharge Instructions (Signed)

## 2014-11-19 NOTE — ED Provider Notes (Signed)
CSN: [id number]     Arrival date & time 11/19/14  1426 History   First MD Initiated Contact with Patient 11/19/14 1457     Chief Complaint  Patient presents with  . Laceration     (Consider location/radiation/quality/duration/timing/severity/associated sxs/prior Treatment) HPI  [name] is a 81 y.o. female who presents to the Emergency Department complaining of laceration to the right thumb that occurred just prior to ED arrival.  She states she was using a paper cutter and cut her finger trying to pull the paper out.  She reports bleeding to her thumb that is controlled with pressure.  She denies significant pain, numbness or weakness.  Unsure of last Td.  Denies ASA use or blood thinners      Past Medical History  Diagnosis Date  . Hypercholesterolemia   . Hypertension 1984    renal artery  . Fibrocystic breast disease   . H/O: hysterectomy    Past Surgical History  Procedure Laterality Date  . Breast biopsies      for benign lumps  . Vein ligation  1974    both legs  . Tonsillectomy    . Breast lumpectomy  1985  . Belpharoptosis repair    . Total hip arthroplasty      right  . Renal artery angioplasty  1987    right Dr. Jean Rosenthal   No family history on file. Social History  Substance Use Topics  . Smoking status: Former Smoker    Quit date: 03/15/1987  . Smokeless tobacco: None  . Alcohol Use: No   OB History    No data available     Review of Systems  Constitutional: Negative for fever and chills.  Musculoskeletal: Negative for joint swelling and arthralgias.  Skin: Positive for wound.       Laceration right thumb  Neurological: Negative for dizziness, weakness and numbness.  Hematological: Does not bruise/bleed easily.  All other systems reviewed and are negative.     Allergies  Baycol; Ciprofloxacin; Crestor; Lipitor; Morphine and related; Pravachol; and Propylene glycol  Home Medications   Prior to Admission medications   Medication Sig Start  Date End Date Taking? Authorizing Provider  losartan-hydrochlorothiazide (HYZAAR) 100-25 MG per tablet TAKE ONE (1) TABLET BY MOUTH EVERY DAY 07/10/14   Cassell Clement, MD  metoprolol succinate (TOPROL-XL) 25 MG 24 hr tablet TAKE ONE (1) TABLET BY MOUTH EVERY DAY 06/24/14   Cassell Clement, MD  OMEPRAZOLE PO Take by mouth daily.    Historical Provider, MD  potassium chloride (K-DUR) 10 MEQ tablet TAKE ONE (1) TABLET EACH DAY 07/28/14   Cassell Clement, MD  temazepam (RESTORIL) 15 MG capsule Take 1 to 2 at bedtime as needed 04/13/11   Cassell Clement, MD  ZETIA 10 MG tablet TAKE ONE (1) TABLET EACH DAY 07/28/14   Cassell Clement, MD   BP 142/83 mmHg  Pulse 91  Temp(Src) 97.5 F (36.4 C) (Oral)  Resp 18  Ht  (1.575 m)  Wt 165 lb (74.844 kg)  BMI 30.17 kg/m2  SpO2 95% Physical Exam  Constitutional: She is oriented to person, place, and time. She appears well-developed and well-nourished. No distress.  HENT:  Head: Normocephalic and atraumatic.  Cardiovascular: Normal rate, regular rhythm and intact distal pulses.   No murmur heard. Pulmonary/Chest: Effort normal and breath sounds normal.  Musculoskeletal: Normal range of motion. She exhibits no edema or tenderness.  Pt has full ROM of the right thumb.  Finger/thumb opposition nml.  Sensation  intact.    Neurological: She is alert and oriented to person, place, and time. She exhibits normal muscle tone. Coordination normal.  Skin: Skin is warm. Laceration noted.  3 cm Laceration to the fat pad of the distal right thumb.  Bleeding controlled.  Nursing note and vitals reviewed.   ED Course  Procedures (including critical care time)   LACERATION REPAIR Performed by: Shon Indelicato L. Authorized by: Maxwell Caul Consent: Verbal consent obtained. Risks and benefits: risks, benefits and alternatives were discussed Consent given by: patient Patient identity confirmed: provided demographic data Prepped and Draped in normal  sterile fashion Wound explored  Laceration Location: right thumb  Laceration Length: 3 cm  No Foreign Bodies seen or palpated  Anesthesia: local infiltration  Local anesthetic: lidocaine 1 % w/o epinephrine  Anesthetic total: 2 ml  Irrigation method: syringe Amount of cleaning: standard  Skin closure: 4-0 prolene  Number of sutures: 5  Technique: simple interrupted  Patient tolerance: Patient tolerated the procedure well with no immediate complications.  MDM   Final diagnoses:  Thumb laceration, right, initial encounter    Td given.  Remains NV intact.  Bleeding controlled.  Dressing applied.  Pt advised to return for any signs of infection.  Wound care instructions given.  Sutures out in 10 days.    Pauline Aus, PA-C 11/19/14 1630  Mancel Bale, MD 11/30/14 [phone]

## 2014-11-19 NOTE — ED Notes (Signed)
Cut right thumb with a paper cutter just PTA. Bleeding is controlled.

## 2014-11-29 DIAGNOSIS — S61011D Laceration without foreign body of right thumb without damage to nail, subsequent encounter: Secondary | ICD-10-CM | POA: Diagnosis not present

## 2014-12-18 ENCOUNTER — Encounter: Payer: Self-pay | Admitting: Cardiology

## 2014-12-25 ENCOUNTER — Ambulatory Visit (INDEPENDENT_AMBULATORY_CARE_PROVIDER_SITE_OTHER): Payer: Medicare Other | Admitting: Cardiology

## 2014-12-25 ENCOUNTER — Encounter: Payer: Self-pay | Admitting: Cardiology

## 2014-12-25 ENCOUNTER — Other Ambulatory Visit (INDEPENDENT_AMBULATORY_CARE_PROVIDER_SITE_OTHER): Payer: Medicare Other | Admitting: *Deleted

## 2014-12-25 VITALS — BP 132/70 | HR 75 | Ht 62.5 in | Wt 175.4 lb

## 2014-12-25 DIAGNOSIS — I10 Essential (primary) hypertension: Secondary | ICD-10-CM

## 2014-12-25 DIAGNOSIS — Z9189 Other specified personal risk factors, not elsewhere classified: Secondary | ICD-10-CM

## 2014-12-25 DIAGNOSIS — Z889 Allergy status to unspecified drugs, medicaments and biological substances status: Secondary | ICD-10-CM

## 2014-12-25 DIAGNOSIS — E7800 Pure hypercholesterolemia, unspecified: Secondary | ICD-10-CM | POA: Diagnosis not present

## 2014-12-25 DIAGNOSIS — I119 Hypertensive heart disease without heart failure: Secondary | ICD-10-CM | POA: Diagnosis not present

## 2014-12-25 LAB — BASIC METABOLIC PANEL
BUN: 12 mg/dL (ref 7–25)
CO2: 32 mmol/L — ABNORMAL HIGH (ref 20–31)
CREATININE: 0.84 mg/dL (ref 0.60–0.93)
Calcium: 9.7 mg/dL (ref 8.6–10.4)
Chloride: 100 mmol/L (ref 98–110)
GLUCOSE: 100 mg/dL — AB (ref 65–99)
Potassium: 3.8 mmol/L (ref 3.5–5.3)
Sodium: 140 mmol/L (ref 135–146)

## 2014-12-25 LAB — LIPID PANEL
CHOLESTEROL: 178 mg/dL (ref 125–200)
HDL: 47 mg/dL (ref >=46)
LDL Cholesterol: 103 mg/dL (ref <130)
TRIGLYCERIDES: 141 mg/dL (ref <150)
Total CHOL/HDL Ratio: 3.8 Ratio (ref <=5.0)
VLDL: 28 mg/dL (ref <30)

## 2014-12-25 LAB — HEPATIC FUNCTION PANEL
ALK PHOS: 67 U/L (ref 33–130)
ALT: 14 U/L (ref 6–29)
AST: 21 U/L (ref 10–35)
Albumin: 3.9 g/dL (ref 3.6–5.1)
BILIRUBIN DIRECT: 0.1 mg/dL (ref <=0.2)
BILIRUBIN INDIRECT: 0.5 mg/dL (ref 0.2–1.2)
TOTAL PROTEIN: 7.5 g/dL (ref 6.1–8.1)
Total Bilirubin: 0.6 mg/dL (ref 0.2–1.2)

## 2014-12-25 NOTE — Progress Notes (Signed)
Quick Note:  Please report to patient. The recent labs are stable. Continue same medication and careful diet. ______ 

## 2014-12-25 NOTE — Progress Notes (Signed)
Cardiology Office Note   Date:  12/25/2014   ID:  ROMEKA SCIFRES, DOB 05-19-1933, MRN [id number]  PCP:  Lanette Hampshire, MD  Cardiologist: Darlin Coco MD  No chief complaint on file.     History of Present Illness: EMANI TAUSSIG is a 81 y.o. female who presents for a six-month follow-up visit  This pleasant 81 year old woman is seen for a six-month followup office visit. She has a history of essential hypertension. She had a remote history of renal artery angioplasty in 1987 by Dr. Glennon Mac. She has not been expressing any chest pain or shortness of breath. She does not have any history of known coronary disease. She had a normal LexiScan Cardiolite stress test on 07/14/08 with no ischemia and with an ejection fraction of 76%.  The patient has a history of hypercholesterolemia.  She is intolerant of statins.  She is on ezetimibe.  She is not having any myalgias. She continues to have problems losing weight.  However she has lost 4 pounds since we last saw her 6 months ago.  Her weakness is bread.   Past Medical History  Diagnosis Date  . Hypercholesterolemia   . Hypertension 1984    renal artery  . Fibrocystic breast disease   . H/O: hysterectomy     Past Surgical History  Procedure Laterality Date  . Breast biopsies      for benign lumps  . Vein ligation  1974    both legs  . Tonsillectomy    . Breast lumpectomy  1985  . Belpharoptosis repair    . Total hip arthroplasty      right  . Renal artery angioplasty  1987    right Dr. Glennon Mac     Current Outpatient Prescriptions  Medication Sig Dispense Refill  . ezetimibe (ZETIA) 10 MG tablet Take 10 mg by mouth daily.    Marland Kitchen losartan-hydrochlorothiazide (HYZAAR) 100-25 MG per tablet TAKE ONE (1) TABLET BY MOUTH EVERY DAY 90 tablet 1  . metoprolol succinate (TOPROL-XL) 25 MG 24 hr tablet TAKE ONE (1) TABLET BY MOUTH EVERY DAY 90 tablet 1  . OMEPRAZOLE PO Take 1 capsule by mouth daily.     . potassium chloride  (K-DUR,KLOR-CON) 10 MEQ tablet Take 10 mEq by mouth daily.    . temazepam (RESTORIL) 15 MG capsule Take 15-30 mg by mouth at bedtime as needed for sleep (sleep).     No current facility-administered medications for this visit.    Allergies:   Baycol; Ciprofloxacin; Crestor; Lipitor; Morphine and related; Pravachol; Propylene glycol; and Diphenhydramine hcl    Social History:  The patient  reports that she quit smoking about 27 years ago. She does not have any smokeless tobacco history on file. She reports that she does not drink alcohol.   Family History:  The patient's family history includes Multiple myeloma in her father.    ROS:  Please see the history of present illness.   Otherwise, review of systems are positive for none.   All other systems are reviewed and negative.    PHYSICAL EXAM: VS:  BP 132/70 mmHg  Pulse 75  Ht 5' 2.5" (1.588 m)  Wt 175 lb 6.4 oz (79.561 kg)  BMI 31.55 kg/m2 , BMI Body mass index is 31.55 kg/(m^2). GEN: Well nourished, well developed, in no acute distress HEENT: normal Neck: no JVD, carotid bruits, or masses Cardiac: RRR; no murmurs, rubs, or gallops,no edema  Respiratory:  clear to auscultation bilaterally, normal work of breathing  GI: soft, nontender, nondistended, + BS MS: no deformity or atrophy Skin: warm and dry, no rash Neuro:  Strength and sensation are intact Psych: euthymic mood, full affect   EKG:  EKG  ordered today.  EKG shows normal sinus rhythm at 75 bpm with first-degree AV block and with PACs.  Since prior tracing of 10/25/13, no significant change.    Recent Labs: 05/09/2014: ALT 12; BUN 17; Creatinine, Ser 0.93; Potassium 4.0; Sodium 140    Lipid Panel    Component Value Date/Time   CHOL 170 05/09/2014 0944   TRIG 122.0 05/09/2014 0944   HDL 45.10 05/09/2014 0944   CHOLHDL 4 05/09/2014 0944   VLDL 24.4 05/09/2014 0944   LDLCALC 101* 05/09/2014 0944      Wt Readings from Last 3 Encounters:  12/25/14 175 lb 6.4 oz  (79.561 kg)  11/19/14 165 lb (74.844 kg)  05/20/14 179 lb 9.6 oz (81.466 kg)        ASSESSMENT AND PLAN:  1. essential hypertension without heart failure. 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  2. Hyperglycemia  3. Dyslipidemia   Disposition: Continue on same medication. Work harder on diet and avoiding bread and carbohydrates. Recheck in 6 months for office visit  lipid panel hepatic function panel and basal metabolic panel    Current medicines are reviewed at length with the patient today.  The patient does not have concerns regarding medicines.  The following changes have been made:  no change  Labs/ tests ordered today include:  No orders of the defined types were placed in this encounter.       Berna Spare MD 12/25/2014 1:29 PM    [facility] [address] [place], [place], [place]  [address] Phone: [phone]; Fax: [phone]

## 2014-12-25 NOTE — Patient Instructions (Signed)
Medication Instructions:  Your physician recommends that you continue on your current medications as directed. Please refer to the Current Medication list given to you today.  Labwork: none  Testing/Procedures: none  Follow-Up: Your physician wants you to follow-up in: 6 month ov You will receive a reminder letter in the mail two months in advance. If you don't receive a letter, please call our office to schedule the follow-up appointment.   Any Other Special Instructions Will Be Listed Below (If Applicable). Dr [facility] Dr Purvis SheffieldKoneswaran at the [place] [facility]

## 2014-12-25 NOTE — Addendum Note (Signed)
Addended by: Tonita PhoenixBOWDEN, Loida Calamia K on: 12/25/2014 09:30 AM   Modules accepted: Orders

## 2014-12-31 ENCOUNTER — Telehealth: Payer: Self-pay | Admitting: Cardiology

## 2014-12-31 NOTE — Telephone Encounter (Signed)
New Message  Pt calling to speak w/ RN- did not specify the nature of call. Please call back and discuss.

## 2014-12-31 NOTE — Telephone Encounter (Signed)
Spoke with patient and schedule recall for MD in [place]

## 2015-01-07 ENCOUNTER — Other Ambulatory Visit: Payer: Medicare Other

## 2015-01-07 ENCOUNTER — Ambulatory Visit: Payer: Medicare Other | Admitting: Cardiology

## 2015-01-08 ENCOUNTER — Other Ambulatory Visit: Payer: Self-pay | Admitting: Cardiology

## 2015-01-26 ENCOUNTER — Telehealth: Payer: Self-pay | Admitting: Cardiology

## 2015-01-26 NOTE — Telephone Encounter (Signed)
Left message to call back  

## 2015-01-26 NOTE — Telephone Encounter (Signed)
Follow up  Pt c/o medication issue:  4. What is your medication issue? Can she take a statin instead of zetia. Please call back to discuss or lvm.

## 2015-01-29 NOTE — Telephone Encounter (Signed)
Follow up ° ° ° ° ° °Returning a call to the nurse °

## 2015-01-29 NOTE — Telephone Encounter (Signed)
Spoke with patient and she was unaware that she had tried 3 previous statins and she will just continue Zetia as she has been taking

## 2015-02-03 ENCOUNTER — Other Ambulatory Visit: Payer: Self-pay | Admitting: Cardiology

## 2015-03-06 ENCOUNTER — Other Ambulatory Visit: Payer: Self-pay | Admitting: Cardiology

## 2015-03-12 DIAGNOSIS — Z23 Encounter for immunization: Secondary | ICD-10-CM | POA: Diagnosis not present

## 2015-03-15 HISTORY — PX: WRIST FRACTURE SURGERY: SHX121

## 2015-04-07 DIAGNOSIS — Z1231 Encounter for screening mammogram for malignant neoplasm of breast: Secondary | ICD-10-CM | POA: Diagnosis not present

## 2015-04-27 ENCOUNTER — Telehealth: Payer: Self-pay | Admitting: Cardiology

## 2015-04-27 NOTE — Telephone Encounter (Signed)
Patient states that Zetia is too expensive and wants to know if she can be changed to something else. / tg

## 2015-04-27 NOTE — Telephone Encounter (Signed)
Spoke with patient and generic Zetia is over $200  She does not remember the problems she has had with statins in the past but denies any severe reactions to any medications Will forward to  Dr. Patty Sermons for review

## 2015-04-28 NOTE — Telephone Encounter (Signed)
Would try crestor 5 mg tab and just take half tablet QOD.

## 2015-04-29 NOTE — Telephone Encounter (Signed)
Left message to call back  

## 2015-04-30 NOTE — Telephone Encounter (Signed)
Follow up      Returning a call to the nurse.  Please call before 12:00.

## 2015-04-30 NOTE — Telephone Encounter (Signed)
Left message to call back  

## 2015-05-01 MED ORDER — ROSUVASTATIN CALCIUM 5 MG PO TABS: ORAL_TABLET | ORAL | Status: DC

## 2015-05-01 NOTE — Telephone Encounter (Signed)
Advised patient of recommendation, agreeable with plan

## 2015-05-18 ENCOUNTER — Ambulatory Visit (HOSPITAL_COMMUNITY)
Admission: RE | Admit: 2015-05-18 | Discharge: 2015-05-18 | Disposition: A | Discharge status: Home / Self Care | Payer: Medicare Other | Source: Ambulatory Visit | Attending: Pulmonary Disease | Admitting: Pulmonary Disease

## 2015-05-18 ENCOUNTER — Other Ambulatory Visit (HOSPITAL_COMMUNITY): Payer: Self-pay | Admitting: Pulmonary Disease

## 2015-05-18 DIAGNOSIS — M79604 Pain in right leg: Secondary | ICD-10-CM

## 2015-05-18 DIAGNOSIS — I809 Phlebitis and thrombophlebitis of unspecified site: Secondary | ICD-10-CM | POA: Diagnosis not present

## 2015-05-18 DIAGNOSIS — I8391 Asymptomatic varicose veins of right lower extremity: Secondary | ICD-10-CM | POA: Diagnosis not present

## 2015-05-18 DIAGNOSIS — I10 Essential (primary) hypertension: Secondary | ICD-10-CM | POA: Diagnosis not present

## 2015-05-18 DIAGNOSIS — M79661 Pain in right lower leg: Secondary | ICD-10-CM | POA: Diagnosis not present

## 2015-05-20 DIAGNOSIS — H35372 Puckering of macula, left eye: Secondary | ICD-10-CM | POA: Diagnosis not present

## 2015-05-20 DIAGNOSIS — H524 Presbyopia: Secondary | ICD-10-CM | POA: Diagnosis not present

## 2015-05-20 DIAGNOSIS — H5203 Hypermetropia, bilateral: Secondary | ICD-10-CM | POA: Diagnosis not present

## 2015-05-20 DIAGNOSIS — H52223 Regular astigmatism, bilateral: Secondary | ICD-10-CM | POA: Diagnosis not present

## 2015-06-01 DIAGNOSIS — I773 Arterial fibromuscular dysplasia: Secondary | ICD-10-CM | POA: Diagnosis not present

## 2015-06-01 DIAGNOSIS — I83001 Varicose veins of unspecified lower extremity with ulcer of thigh: Secondary | ICD-10-CM | POA: Diagnosis not present

## 2015-06-01 DIAGNOSIS — I10 Essential (primary) hypertension: Secondary | ICD-10-CM | POA: Diagnosis not present

## 2015-07-03 DIAGNOSIS — L821 Other seborrheic keratosis: Secondary | ICD-10-CM | POA: Diagnosis not present

## 2015-07-09 ENCOUNTER — Ambulatory Visit: Payer: Medicare Other | Admitting: Cardiovascular Disease

## 2015-08-26 ENCOUNTER — Ambulatory Visit (INDEPENDENT_AMBULATORY_CARE_PROVIDER_SITE_OTHER): Payer: Medicare Other | Admitting: Physician Assistant

## 2015-08-26 ENCOUNTER — Encounter: Payer: Self-pay | Admitting: Physician Assistant

## 2015-08-26 VITALS — BP 144/78 | HR 91 | Ht 64.0 in | Wt 171.0 lb

## 2015-08-26 DIAGNOSIS — Z8679 Personal history of other diseases of the circulatory system: Secondary | ICD-10-CM

## 2015-08-26 DIAGNOSIS — I10 Essential (primary) hypertension: Secondary | ICD-10-CM | POA: Diagnosis not present

## 2015-08-26 DIAGNOSIS — Z87448 Personal history of other diseases of urinary system: Secondary | ICD-10-CM

## 2015-08-26 DIAGNOSIS — E7800 Pure hypercholesterolemia, unspecified: Secondary | ICD-10-CM | POA: Diagnosis not present

## 2015-08-26 NOTE — Patient Instructions (Signed)
Your physician recommends that you schedule a follow-up appointment in: 2 Months with Dr. Koneswaran.   Your physician recommends that you continue on your current medications as directed. Please refer to the Current Medication list given to you today.  If you need a refill on your cardiac medications before your next appointment, please call your pharmacy.  Thank you for choosing [facility]!    

## 2015-08-26 NOTE — Progress Notes (Signed)
Cardiology Office Note    Date:  08/26/2015   ID:  [name], DOB Apr 26, 1933, MRN [id number]  PCP:  Alonza Bogus, MD  Cardiologist: former Dr. Mare Ferrari to see Dr. Bronson Ing   Chief complaint elevated blood pressure  History of Present Illness:  [name] is a 82 y.o. female with history of essential hypertension, remote renal artery angioplasty in 1987, Lexi scan Cardiolite 07/2008 with no ischemia ejection fraction 76%. No known CAD. Hypercholesterolemia intolerant to statins and most recently switched to  Crestor.  Patient comes in today because of a blood pressure this morning of 160/113. She is in the middle of downsizing him moving and under quite a bit of stress. She has not been checking her blood pressures so she checked it this morning and it was very high. She checked it 30 minutes after she had taken her medications. Several hours later was still 158/100. When she got here was down to 144/78. She thinks it might be her machine. She denies any excessive sodium or eating out more. She occasionally eats a lean cuisine and tomato soup but usually only once every 2 weeks. She denies chest pain, palpitations, dizziness or presyncope.She did not tolerate Crestor and switch back to Zetia and is now on generic.  I actually had patient go home and get her blood pressure machine. I checked her blood pressure with her machine and manually and they coincided 134/81 right and left arm on the machine and manually.     Past Medical History  Diagnosis Date  . Hypercholesterolemia   . Hypertension 1984    renal artery  . Fibrocystic breast disease   . H/O: hysterectomy     Past Surgical History  Procedure Laterality Date  . Breast biopsies      for benign lumps  . Vein ligation  1974    both legs  . Tonsillectomy    . Breast lumpectomy  1985  . Belpharoptosis repair    . Total hip arthroplasty      right  . Renal artery angioplasty  1987    right Dr. Glennon Mac    Current  Medications: Outpatient Prescriptions Prior to Visit  Medication Sig Dispense Refill  . losartan-hydrochlorothiazide (HYZAAR) 100-25 MG tablet TAKE ONE (1) TABLET BY MOUTH EVERY DAY 90 tablet 2  . metoprolol succinate (TOPROL-XL) 25 MG 24 hr tablet Take 1 tablet (25 mg total) by mouth daily. 90 tablet 2  . OMEPRAZOLE PO Take 1 capsule by mouth daily.     . potassium chloride (K-DUR,KLOR-CON) 10 MEQ tablet Take 10 mEq by mouth daily.    . rosuvastatin (CRESTOR) 5 MG tablet 1/2 tablet by mouth every other day 15 tablet 3  . ZETIA 10 MG tablet TAKE ONE TABLET ONCE DAILY 90 tablet 1  . temazepam (RESTORIL) 15 MG capsule Take 15-30 mg by mouth at bedtime as needed for sleep (sleep). Reported on 08/26/2015    . potassium chloride (K-DUR) 10 MEQ tablet TAKE ONE TABLET ONCE DAILY 90 tablet 1   No facility-administered medications prior to visit.     Allergies:   Baycol; Ciprofloxacin; Crestor; Lipitor; Morphine and related; Pravachol; Propylene glycol; and Diphenhydramine hcl   Social History   Social History  . Marital Status: Married    Spouse Name: N/A  . Number of Children: N/A  . Years of Education: N/A   Social History Main Topics  . Smoking status: Former Smoker    Quit date: 03/15/1987  . Smokeless  tobacco: None  . Alcohol Use: No  . Drug Use: None  . Sexual Activity: Not Asked   Other Topics Concern  . None   Social History Narrative     Family History:  The patient's    family history includes Multiple myeloma in her father.   ROS:   Please see the history of present illness.    Review of Systems  Constitution: Negative.  HENT: Negative.   Eyes: Negative.   Cardiovascular: Negative.   Respiratory: Negative.   Hematologic/Lymphatic: Negative.   Musculoskeletal: Negative.  Negative for joint pain.  Gastrointestinal: Negative.   Genitourinary: Negative.   Neurological: Negative.    All other systems reviewed and are negative.   PHYSICAL EXAM:   VS:  BP 144/78  mmHg  Pulse 91  Ht '5\' 4"'$  (1.626 m)  Wt 171 lb (77.565 kg)  BMI 29.34 kg/m2  SpO2 95%   GEN: Well nourished, well developed, in no acute distress Neck: no JVD, carotid bruits, or masses Cardiac:  RRR; positive S4 no murmurs, rubs, or no edema  Respiratory:  clear to auscultation bilaterally, normal work of breathing GI: soft, nontender, nondistended, + BS MS: no deformity or atrophy Skin: warm and dry, no rash Neuro:  Alert and Oriented x 3, Strength and sensation are intact Psych: euthymic mood, full affect  Wt Readings from Last 3 Encounters:  08/26/15 171 lb (77.565 kg)  12/25/14 175 lb 6.4 oz (79.561 kg)  11/19/14 165 lb (74.844 kg)      Studies/Labs Reviewed:   EKG:  EKG is not ordered today.    Recent Labs: 12/25/2014: ALT 14; BUN 12; Creat 0.84; Potassium 3.8; Sodium 140   Lipid Panel    Component Value Date/Time   CHOL 178 12/25/2014 1103   TRIG 141 12/25/2014 1103   HDL 47 12/25/2014 1103   CHOLHDL 3.8 12/25/2014 1103   VLDL 28 12/25/2014 1103   LDLCALC 103 12/25/2014 1103    Additional studies/ records that were reviewed today include:      ASSESSMENT:    1. Essential hypertension   2. Hypercholesterolemia   3. History of renal artery stenosis      PLAN:  In order of problems listed above:  Essential hypertension: Patient's blood pressure was elevated at home on her cuff but is stable here today. She is under a lot of stress and may be getting extra sodium in her diet. She does have history of renal artery angioplasties in the remote past. I've asked her to keep track of her blood pressures at home and if they stay up to call us back and we will order a renal ultrasound. Follow-up with Dr.Koneswaran to establish care.  Hypercholesterolemia patient was intolerant to Crestor and is now on generic Zetia. Should have fasting lipid panel.  History of renal artery stenosis status post bilateral angioplasties in the remote past now with questionable  elevated blood pressures. The pressure remains elevated should order renal ultrasound.   Medication Adjustments/Labs and Tests Ordered: Current medicines are reviewed at length with the patient today.  Concerns regarding medicines are outlined above.  Medication changes, Labs and Tests ordered today are listed in the Patient Instructions below. Patient Instructions  Your physician recommends that you schedule a follow-up appointment in: 2 Months with Dr. Bronson Ing  Your physician recommends that you continue on your current medications as directed. Please refer to the Current Medication list given to you today.  If you need a refill on your cardiac medications  before your next appointment, please call your pharmacy.  Thank you for choosing [facility]!        Sumner Boast, PA-C  08/26/2015 2:03 PM    [facility] [place], [place], [place]  [address] Phone: [phone]; Fax: [phone]

## 2015-09-29 ENCOUNTER — Encounter: Payer: Self-pay | Admitting: Sports Medicine

## 2015-09-29 ENCOUNTER — Ambulatory Visit (INDEPENDENT_AMBULATORY_CARE_PROVIDER_SITE_OTHER): Payer: Medicare Other | Admitting: Sports Medicine

## 2015-09-29 DIAGNOSIS — L603 Nail dystrophy: Secondary | ICD-10-CM | POA: Diagnosis not present

## 2015-09-29 DIAGNOSIS — B351 Tinea unguium: Secondary | ICD-10-CM | POA: Diagnosis not present

## 2015-09-29 DIAGNOSIS — M2040 Other hammer toe(s) (acquired), unspecified foot: Secondary | ICD-10-CM

## 2015-09-29 DIAGNOSIS — M79675 Pain in left toe(s): Secondary | ICD-10-CM | POA: Diagnosis not present

## 2015-09-29 DIAGNOSIS — M79674 Pain in right toe(s): Secondary | ICD-10-CM | POA: Diagnosis not present

## 2015-09-29 DIAGNOSIS — I739 Peripheral vascular disease, unspecified: Secondary | ICD-10-CM

## 2015-09-29 NOTE — Progress Notes (Signed)
Patient ID: Kelsey NeasGail C Doucet, female   DOB: 08/24/1933, 82 y.o.   MRN: [id number] Subjective: [name] is a 82 y.o. female patient seen today in office with complaint of painful thickened and elongated toenails; unable to trim. Patient denies history of Diabetes or Neuropathy. Admits that she goes for pedicures and sometimes her pedicurist treatments have the ingrown portions of her big toenails. Reports that nothing hurts at today's visit. However, just has questions about how her nails are growing and desires nail trim. Patient has no other pedal complaints at this time.   Patient Active Problem List   Diagnosis Date Noted  . History of renal artery stenosis 08/26/2015  . History of multiple allergies 10/25/2013  . Benign hypertensive heart disease without heart failure 08/06/2010  . Hypercholesterolemia 08/06/2010    Current Outpatient Prescriptions on File Prior to Visit  Medication Sig Dispense Refill  . losartan-hydrochlorothiazide (HYZAAR) 100-25 MG tablet TAKE ONE (1) TABLET BY MOUTH EVERY DAY 90 tablet 2  . metoprolol succinate (TOPROL-XL) 25 MG 24 hr tablet Take 1 tablet (25 mg total) by mouth daily. 90 tablet 2  . OMEPRAZOLE PO Take 1 capsule by mouth daily.     . potassium chloride (K-DUR,KLOR-CON) 10 MEQ tablet Take 10 mEq by mouth daily.    . rosuvastatin (CRESTOR) 5 MG tablet 1/2 tablet by mouth every other day 15 tablet 3  . temazepam (RESTORIL) 15 MG capsule Take 15-30 mg by mouth at bedtime as needed for sleep (sleep). Reported on 08/26/2015    . ZETIA 10 MG tablet TAKE ONE TABLET ONCE DAILY 90 tablet 1   No current facility-administered medications on file prior to visit.    Allergies  Allergen Reactions  . Baycol [Cerivastatin Sodium]   . Crestor [Rosuvastatin Calcium] Other (See Comments)    Pt doesn't recall reaction  . Lipitor [Atorvastatin Calcium] Other (See Comments)    Pt doesn't recall reaction  . Morphine And Related Other (See Comments)    Hallucinations   .  Pravachol Other (See Comments)    Pt doesn't recall reaction  . Propylene Glycol     Severe itching  . Ciprofloxacin Hives and Rash  . Diphenhydramine Hcl Rash    Objective: Physical Exam  General: Well developed, nourished, no acute distress, awake, alert and oriented x 3  Vascular: Dorsalis pedis artery 1/4 bilateral, Posterior tibial artery 1/4 bilateral, skin temperature warm to warm proximal to distal bilateral lower extremities, + varicosities, scant pedal hair present bilateral.  Neurological: Gross sensation present via light touch bilateral.   Dermatological: Skin is warm, dry, and supple bilateral, Nails 1-10 are tender, long, thick, and discolored with mild subungal debris, no webspace macerations present bilateral, no open lesions present bilateral, no callus/corns/hyperkeratotic tissue present bilateral. No signs of infection bilateral.  Musculoskeletal:  Asymptomatic mild hammertoe boney deformities noted bilateral. Muscular strength within normal limits without painon range of motion. No pain with calf compression bilateral.  Assessment and Plan:  Problem List Items Addressed This Visit    None    Visit Diagnoses    Nail dystrophy    -  Primary    Toe pain, bilateral        PVD (peripheral vascular disease) (HCC)        Hammertoe, unspecified laterality           -Examined patient.  -Discussed treatment options for painful mycotic nails. -Mechanically debrided and reduced mycotic nails with sterile nail nipper and dremel nail file without  incident. -Recommend elevation of lower limbs to assist with venous changes -Recommend good supportive shoes daily for foot type -Patient to return as needed for follow up evaluation or sooner if symptoms worsen. Patient was made aware that nail trim for today's visit, May not be covered.   Asencion Islam, DPM

## 2015-10-06 DIAGNOSIS — I10 Essential (primary) hypertension: Secondary | ICD-10-CM | POA: Diagnosis not present

## 2015-10-06 DIAGNOSIS — I773 Arterial fibromuscular dysplasia: Secondary | ICD-10-CM | POA: Diagnosis not present

## 2015-10-06 DIAGNOSIS — K210 Gastro-esophageal reflux disease with esophagitis: Secondary | ICD-10-CM | POA: Diagnosis not present

## 2015-10-06 DIAGNOSIS — G4700 Insomnia, unspecified: Secondary | ICD-10-CM | POA: Diagnosis not present

## 2015-10-23 ENCOUNTER — Other Ambulatory Visit: Payer: Self-pay

## 2015-10-23 MED ORDER — POTASSIUM CHLORIDE CRYS ER 10 MEQ PO TBCR: 10.0000 meq | EXTENDED_RELEASE_TABLET | Freq: Every day | ORAL | 3 refills | Status: DC

## 2015-10-23 NOTE — Telephone Encounter (Signed)
Refill complete 

## 2015-10-26 ENCOUNTER — Ambulatory Visit (INDEPENDENT_AMBULATORY_CARE_PROVIDER_SITE_OTHER): Payer: Medicare Other | Admitting: Cardiovascular Disease

## 2015-10-26 ENCOUNTER — Encounter: Payer: Self-pay | Admitting: Cardiovascular Disease

## 2015-10-26 VITALS — BP 132/78 | HR 72 | Ht 62.0 in | Wt 169.0 lb

## 2015-10-26 DIAGNOSIS — E785 Hyperlipidemia, unspecified: Secondary | ICD-10-CM

## 2015-10-26 DIAGNOSIS — Z9189 Other specified personal risk factors, not elsewhere classified: Secondary | ICD-10-CM | POA: Diagnosis not present

## 2015-10-26 DIAGNOSIS — Z8679 Personal history of other diseases of the circulatory system: Secondary | ICD-10-CM

## 2015-10-26 DIAGNOSIS — I10 Essential (primary) hypertension: Secondary | ICD-10-CM

## 2015-10-26 DIAGNOSIS — Z87448 Personal history of other diseases of urinary system: Secondary | ICD-10-CM

## 2015-10-26 DIAGNOSIS — Z889 Allergy status to unspecified drugs, medicaments and biological substances status: Secondary | ICD-10-CM

## 2015-10-26 LAB — LIPID PANEL
CHOLESTEROL: 179 mg/dL (ref 125–200)
HDL: 49 mg/dL (ref >=46)
LDL CALC: 107 mg/dL (ref <130)
TRIGLYCERIDES: 113 mg/dL (ref <150)
Total CHOL/HDL Ratio: 3.7 Ratio (ref <=5.0)
VLDL: 23 mg/dL (ref <30)

## 2015-10-26 LAB — BASIC METABOLIC PANEL
BUN: 14 mg/dL (ref 7–25)
CHLORIDE: 102 mmol/L (ref 98–110)
CO2: 31 mmol/L (ref 20–31)
CREATININE: 0.98 mg/dL — AB (ref 0.60–0.88)
Calcium: 9 mg/dL (ref 8.6–10.4)
Glucose, Bld: 96 mg/dL (ref 65–99)
POTASSIUM: 4.1 mmol/L (ref 3.5–5.3)
Sodium: 141 mmol/L (ref 135–146)

## 2015-10-26 NOTE — Progress Notes (Signed)
SUBJECTIVE: [name] is a 82 y.o. female with a history of essential hypertension, remote renal artery angioplasty in 1987, Lexiscan Cardiolite 07/2008 with no ischemia (ejection fraction 76%). No known CAD. Hypercholesterolemia intolerant to statins. Former patient of Dr. Patty SermonsBrackbill for 25 years.  She says "I feel great". Denies chest pain, palpitations, leg swelling, shortness of breath. Checks her blood pressure almost daily and has been controlled.    Review of Systems: As per "subjective", otherwise negative.  Allergies  Allergen Reactions  . Baycol [Cerivastatin Sodium]   . Crestor [Rosuvastatin Calcium] Other (See Comments)    Pt doesn't recall reaction  . Lipitor [Atorvastatin Calcium] Other (See Comments)    Pt doesn't recall reaction  . Morphine And Related Other (See Comments)    Hallucinations   . Pravachol Other (See Comments)    Pt doesn't recall reaction  . Propylene Glycol     Severe itching  . Ciprofloxacin Hives and Rash  . Diphenhydramine Hcl Rash    Current Outpatient Prescriptions  Medication Sig Dispense Refill  . losartan-hydrochlorothiazide (HYZAAR) 100-25 MG tablet TAKE ONE (1) TABLET BY MOUTH EVERY DAY 90 tablet 2  . metoprolol succinate (TOPROL-XL) 25 MG 24 hr tablet Take 1 tablet (25 mg total) by mouth daily. 90 tablet 2  . OMEPRAZOLE PO Take 1 capsule by mouth daily.     . potassium chloride (K-DUR,KLOR-CON) 10 MEQ tablet Take 1 tablet (10 mEq total) by mouth daily. 90 tablet 3  . rosuvastatin (CRESTOR) 5 MG tablet 1/2 tablet by mouth every other day 15 tablet 3  . temazepam (RESTORIL) 15 MG capsule Take 15-30 mg by mouth at bedtime as needed for sleep (sleep). Reported on 08/26/2015    . ZETIA 10 MG tablet TAKE ONE TABLET ONCE DAILY 90 tablet 1   No current facility-administered medications for this visit.     Past Medical History:  Diagnosis Date  . Fibrocystic breast disease   . H/O: hysterectomy   . Hypercholesterolemia   .  Hypertension 1984   renal artery    Past Surgical History:  Procedure Laterality Date  . BELPHAROPTOSIS REPAIR    . breast biopsies     for benign lumps  . BREAST LUMPECTOMY  1985  . RENAL ARTERY ANGIOPLASTY  1987   right Dr. Jean RosenthalJackson  . TONSILLECTOMY    . TOTAL HIP ARTHROPLASTY     right  . VEIN LIGATION  1974   both legs    Social History   Social History  . Marital status: Married    Spouse name: N/A  . Number of children: N/A  . Years of education: N/A   Occupational History  . Not on file.   Social History Main Topics  . Smoking status: Former Smoker    Quit date: 03/15/1987  . Smokeless tobacco: Not on file  . Alcohol use No  . Drug use: Unknown  . Sexual activity: Not on file   Other Topics Concern  . Not on file   Social History Narrative  . No narrative on file     Vitals:   10/26/15 0817  BP: 132/78  Pulse: 72  SpO2: 97%  Weight: 169 lb (76.7 kg)  Height: 5\' 2"  (1.575 m)    PHYSICAL EXAM General: NAD HEENT: Normal. Neck: No JVD, no thyromegaly. Lungs: Clear to auscultation bilaterally with normal respiratory effort. CV: Nondisplaced PMI.  Regular rate and rhythm, normal S1/S2, no S3/S4, no murmur. No pretibial or periankle  edema.  No carotid bruit.   Abdomen: Soft, nontender, no distention.  Neurologic: Alert and oriented.  Psych: Normal affect. Skin: Normal. Musculoskeletal: No gross deformities.    ECG: Most recent ECG reviewed.      ASSESSMENT AND PLAN: 1. Essential hypertension: Controlled. She does have history of renal artery angioplasties in the remote past. No changes at this time. Will check BMET.  2. Hypercholesterolemia: Patient was intolerant to Crestor and is now on generic Zetia. Will obtain fasting lipid panel.  3. History of renal artery stenosis status post bilateral angioplasties in the remote past: BP controlled today. If becomes difficult to control, would obtain renal artery Dopplers.   Dispo: fu 1  year.  Prentice DockerSuresh Arali Somera, M.D., F.A.C.C.

## 2015-10-26 NOTE — Patient Instructions (Signed)
Your physician wants you to follow-up in:  1 year with Dr Reggy EyeKoneswaran You will receive a reminder letter in the mail two months in advance. If you don't receive a letter, please call our office to schedule the follow-up appointment.   Your physician recommends that you continue on your current medications as directed. Please refer to the Current Medication list given to you today.    FASTING Labs: BMET,Lipids       Thank you for choosing [facility] !

## 2015-12-12 ENCOUNTER — Emergency Department (HOSPITAL_COMMUNITY)
Admission: EM | Admit: 2015-12-12 | Discharge: 2015-12-12 | Disposition: A | Discharge status: Home / Self Care | Payer: Medicare Other | Attending: Emergency Medicine | Admitting: Emergency Medicine

## 2015-12-12 ENCOUNTER — Encounter (HOSPITAL_COMMUNITY): Payer: Self-pay | Admitting: Emergency Medicine

## 2015-12-12 ENCOUNTER — Emergency Department (HOSPITAL_COMMUNITY): Payer: Medicare Other

## 2015-12-12 DIAGNOSIS — Y92137 Garden or yard on military base as the place of occurrence of the external cause: Secondary | ICD-10-CM | POA: Diagnosis not present

## 2015-12-12 DIAGNOSIS — S62102A Fracture of unspecified carpal bone, left wrist, initial encounter for closed fracture: Secondary | ICD-10-CM

## 2015-12-12 DIAGNOSIS — S59912A Unspecified injury of left forearm, initial encounter: Secondary | ICD-10-CM | POA: Diagnosis present

## 2015-12-12 DIAGNOSIS — W010XXA Fall on same level from slipping, tripping and stumbling without subsequent striking against object, initial encounter: Secondary | ICD-10-CM | POA: Insufficient documentation

## 2015-12-12 DIAGNOSIS — M25512 Pain in left shoulder: Secondary | ICD-10-CM | POA: Diagnosis not present

## 2015-12-12 DIAGNOSIS — S42202A Unspecified fracture of upper end of left humerus, initial encounter for closed fracture: Secondary | ICD-10-CM | POA: Diagnosis not present

## 2015-12-12 DIAGNOSIS — S42292A Other displaced fracture of upper end of left humerus, initial encounter for closed fracture: Secondary | ICD-10-CM | POA: Insufficient documentation

## 2015-12-12 DIAGNOSIS — I10 Essential (primary) hypertension: Secondary | ICD-10-CM | POA: Insufficient documentation

## 2015-12-12 DIAGNOSIS — S52612A Displaced fracture of left ulna styloid process, initial encounter for closed fracture: Secondary | ICD-10-CM | POA: Diagnosis not present

## 2015-12-12 DIAGNOSIS — Y9301 Activity, walking, marching and hiking: Secondary | ICD-10-CM | POA: Insufficient documentation

## 2015-12-12 DIAGNOSIS — Y999 Unspecified external cause status: Secondary | ICD-10-CM | POA: Insufficient documentation

## 2015-12-12 DIAGNOSIS — S52592A Other fractures of lower end of left radius, initial encounter for closed fracture: Secondary | ICD-10-CM | POA: Diagnosis not present

## 2015-12-12 DIAGNOSIS — Z96641 Presence of right artificial hip joint: Secondary | ICD-10-CM | POA: Insufficient documentation

## 2015-12-12 DIAGNOSIS — Z87891 Personal history of nicotine dependence: Secondary | ICD-10-CM | POA: Diagnosis not present

## 2015-12-12 DIAGNOSIS — S52502A Unspecified fracture of the lower end of left radius, initial encounter for closed fracture: Secondary | ICD-10-CM | POA: Diagnosis not present

## 2015-12-12 DIAGNOSIS — S4992XA Unspecified injury of left shoulder and upper arm, initial encounter: Secondary | ICD-10-CM | POA: Diagnosis not present

## 2015-12-12 MED ORDER — FENTANYL CITRATE (PF) 100 MCG/2ML IJ SOLN
50.0000 ug | Freq: Once | INTRAMUSCULAR | Status: AC
  Administered 2015-12-12: 50 ug via INTRAVENOUS
  Filled 2015-12-12: qty 2

## 2015-12-12 MED ORDER — OXYCODONE-ACETAMINOPHEN 5-325 MG PO TABS: 1.0000 | ORAL_TABLET | Freq: Four times a day (QID) | ORAL | 0 refills | Status: DC | PRN

## 2015-12-12 MED ORDER — OXYCODONE-ACETAMINOPHEN 5-325 MG PO TABS
ORAL_TABLET | ORAL | Status: AC
  Filled 2015-12-12: qty 1

## 2015-12-12 MED ORDER — OXYCODONE-ACETAMINOPHEN 5-325 MG PO TABS
1.0000 | ORAL_TABLET | ORAL | Status: DC | PRN
  Administered 2015-12-12: 1 via ORAL

## 2015-12-12 NOTE — ED Notes (Signed)
Pt to xray

## 2015-12-12 NOTE — ED Notes (Signed)
PT TO XRAY

## 2015-12-12 NOTE — ED Provider Notes (Signed)
[facility]-EMERGENCY DEPT Provider Note   CSN: [id number] Arrival date & time: 12/12/15  1246     History   Chief Complaint Chief Complaint  Patient presents with  . Arm Injury    HPI [name] is a 82 y.o. female.  HPI Pt was walking outside and stepped in a hole.  She stumbled and fell onto her outstretched left arm.  Pt noticed a deformity immediately.  Pain is in the middle of her upper right arm as well as her wrist.  No other injuries.   No LOC.   No blood  Thinners.  Past Medical History:  Diagnosis Date  . Fibrocystic breast disease   . H/O: hysterectomy   . Hypercholesterolemia   . Hypertension 1984   renal artery    Patient Active Problem List   Diagnosis Date Noted  . History of renal artery stenosis 08/26/2015  . History of multiple allergies 10/25/2013  . Benign hypertensive heart disease without heart failure 08/06/2010  . Hypercholesterolemia 08/06/2010    Past Surgical History:  Procedure Laterality Date  . BELPHAROPTOSIS REPAIR    . breast biopsies     for benign lumps  . BREAST LUMPECTOMY  1985  . RENAL ARTERY ANGIOPLASTY  1987   right Dr. Jean Rosenthal  . TONSILLECTOMY    . TOTAL HIP ARTHROPLASTY     right  . VEIN LIGATION  1974   both legs    OB History    No data available       Home Medications    Prior to Admission medications   Medication Sig Start Date End Date Taking? Authorizing Provider  losartan-hydrochlorothiazide (HYZAAR) 100-25 MG tablet TAKE ONE (1) TABLET BY MOUTH EVERY DAY 02/03/15   Cassell Clement, MD  metoprolol succinate (TOPROL-XL) 25 MG 24 hr tablet Take 1 tablet (25 mg total) by mouth daily. 01/09/15   Cassell Clement, MD  OMEPRAZOLE PO Take 1 capsule by mouth daily.     Historical Provider, MD  oxyCODONE-acetaminophen (PERCOCET/ROXICET) 5-325 MG tablet Take 1 tablet by mouth every 6 (six) hours as needed. 12/12/15   Linwood Dibbles, MD  potassium chloride (K-DUR,KLOR-CON) 10 MEQ tablet Take 1 tablet (10 mEq total) by mouth  daily. 10/23/15   Dyann Kief, PA-C  rosuvastatin (CRESTOR) 5 MG tablet 1/2 tablet by mouth every other day 05/01/15   Cassell Clement, MD  temazepam (RESTORIL) 15 MG capsule Take 15-30 mg by mouth at bedtime as needed for sleep (sleep). Reported on 08/26/2015    Historical Provider, MD  ZETIA 10 MG tablet TAKE ONE TABLET ONCE DAILY 03/06/15   Cassell Clement, MD    Family History Family History  Problem Relation Age of Onset  . Multiple myeloma Father     Social History Social History  Substance Use Topics  . Smoking status: Former Smoker    Quit date: 03/15/1987  . Smokeless tobacco: Never Used  . Alcohol use No     Allergies   Baycol [cerivastatin sodium]; Crestor [rosuvastatin calcium]; Lipitor [atorvastatin calcium]; Morphine and related; Pravachol; Propylene glycol; Ciprofloxacin; and Diphenhydramine hcl   Review of Systems Review of Systems  All other systems reviewed and are negative.    Physical Exam Updated Vital Signs BP 147/84   Pulse 81   Temp 97.7 F (36.5 C) (Oral)   Resp 18   SpO2 96%   Physical Exam  Constitutional: She appears well-developed and well-nourished. No distress.  HENT:  Head: Normocephalic and atraumatic.  Right Ear: External ear  normal.  Left Ear: External ear normal.  Eyes: Conjunctivae are normal. Right eye exhibits no discharge. Left eye exhibits no discharge. No scleral icterus.  Neck: Neck supple. No spinous process tenderness present. No neck rigidity. No tracheal deviation and normal range of motion present.  Cardiovascular: Normal rate.   Pulmonary/Chest: Effort normal. No stridor. No respiratory distress.  Abdominal: She exhibits no distension.  Musculoskeletal: She exhibits no edema.       Left shoulder: She exhibits tenderness and bony tenderness. She exhibits no swelling, no effusion, no crepitus and no deformity.       Left elbow: Normal.       Left wrist: She exhibits decreased range of motion, tenderness, bony  tenderness, swelling and deformity. She exhibits no laceration.       Left hand: She exhibits tenderness, bony tenderness and swelling.  Distal nv intact  Neurological: She is alert. Cranial nerve deficit: no gross deficits.  Skin: Skin is warm and dry. No rash noted.  Psychiatric: She has a normal mood and affect.  Nursing note and vitals reviewed.    ED Treatments / Results  Labs (all labs ordered are listed, but only abnormal results are displayed) Labs Reviewed - No data to display  EKG  EKG Interpretation None       Radiology Dg Forearm Left  Result Date: 12/12/2015 CLINICAL DATA:  Pain after fall. EXAM: LEFT FOREARM - 2 VIEW COMPARISON:  None. FINDINGS: Displaced/comminuted fracture of the distal left radius, with probable impaction at the fracture site. Remainder of the radius appears intact and normally aligned. Slightly displaced fracture of the ulnar styloid. Ulna appears otherwise intact and normally aligned. IMPRESSION: 1. Displaced/comminuted fracture of the distal left radius. Extension to the articular surface noted on earlier plain film of the left hand. Suspect some degree of impaction at the fracture site. 2. Slightly displaced fracture of the ulnar styloid. Electronically Signed   By: Franki Cabot M.D.   On: 12/12/2015 15:13   Dg Humerus Left  Result Date: 12/12/2015 CLINICAL DATA:  Pain after fall. EXAM: LEFT HUMERUS - 2+ VIEW COMPARISON:  None. FINDINGS: Irregularity of the left humeral head/neck, highly suggestive of slightly displaced fracture. Remainder of the left humerus appears intact and normally aligned. IMPRESSION: Probable displaced fracture within the left humeral head and/or neck. Consider dedicated plain film examination of the left shoulder to confirm. Electronically Signed   By: Franki Cabot M.D.   On: 12/12/2015 15:14   Dg Hand Complete Left  Result Date: 12/12/2015 CLINICAL DATA:  Left arm pain from hands shoulder after fall. EXAM: LEFT HAND -  COMPLETE 3+ VIEW COMPARISON:  None. FINDINGS: Displaced/comminuted fracture of the distal left radius, with involvement of the articular surface. Additional slightly displaced fracture of the ulnar styloid. Osseous structures of the left hand are diffusely osteopenic which limits characterization of osseous detail but there is no additional fracture line or displaced fracture fragment identified. Degenerative osteoarthritis noted at the left [facility] joint and at the DIP joints. IMPRESSION: 1. Displaced/comminuted fracture of the distal left radius, with involvement of the articular surface. 2. Slightly displaced fracture of the ulnar styloid. 3. No additional fracture seen within the left hand. Electronically Signed   By: Franki Cabot M.D.   On: 12/12/2015 15:11    Procedures Procedures (including critical care time)  Medications Ordered in ED Medications  oxyCODONE-acetaminophen (PERCOCET/ROXICET) 5-325 MG per tablet 1 tablet ( Oral Not Given 12/12/15 1315)  fentaNYL (SUBLIMAZE) injection 50 mcg (50  mcg Intravenous Given 12/12/15 1404)  fentaNYL (SUBLIMAZE) injection 50 mcg (50 mcg Intravenous Given 12/12/15 1529)  fentaNYL (SUBLIMAZE) injection 50 mcg (50 mcg Intravenous Given 12/12/15 1643)     Initial Impression / Assessment and Plan / ED Course  I have reviewed the triage vital signs and the nursing notes.  Pertinent labs & imaging results that were available during my care of the patient were reviewed by me and considered in my medical decision making (see chart for details).  Clinical Course  Comment By Time  I consulted general ortho considering her dual fx. Discussed with Dr Stann Mainland.   Will review films Dorie Rank, MD 09/30 1559  Manipulated wrist with traction while splint was being applied Dorie Rank, MD 09/30 1655    Follow up in the office.  Discussed management of splint and sling,  Ice and elevate  Final Clinical Impressions(s) / ED Diagnoses   Final diagnoses:  Proximal humerus  fracture, left, closed, initial encounter  Wrist fracture, left, closed, initial encounter    New Prescriptions New Prescriptions   OXYCODONE-ACETAMINOPHEN (PERCOCET/ROXICET) 5-325 MG TABLET    Take 1 tablet by mouth every 6 (six) hours as needed.     Dorie Rank, MD 12/12/15 [phone]

## 2015-12-12 NOTE — Discharge Instructions (Signed)
Contact Dr. Aundria Rudogers and Dr. Carlos LeveringGramig's  office on Monday.  Apply ice to help with the swelling keep the arm elevated, and maintain the splint

## 2015-12-12 NOTE — ED Notes (Signed)
Wasted 50 mcg fentanyl with morgan rn

## 2015-12-12 NOTE — ED Triage Notes (Signed)
Tripped in hole in yard, landed on left arm; pain in upper left arm and at wrist; swelling and deformity noted at wrist.

## 2015-12-12 NOTE — ED Notes (Signed)
Ortho at bedside to put on splint

## 2015-12-12 NOTE — Progress Notes (Signed)
Orthopedic Tech Progress Note Patient Details:  Ofilia NeasGail C Emami 01/21/1934 [id number]  Ortho Devices Type of Ortho Device: Ace wrap, Arm sling, Sugartong splint Ortho Device/Splint Interventions: Ordered, Application   Jennye MoccasinHughes, Lyndsie Wallman Craig 12/12/2015, 4:57 PM

## 2015-12-14 DIAGNOSIS — S52572A Other intraarticular fracture of lower end of left radius, initial encounter for closed fracture: Secondary | ICD-10-CM | POA: Diagnosis not present

## 2015-12-14 DIAGNOSIS — S42291A Other displaced fracture of upper end of right humerus, initial encounter for closed fracture: Secondary | ICD-10-CM | POA: Diagnosis not present

## 2015-12-14 DIAGNOSIS — M25532 Pain in left wrist: Secondary | ICD-10-CM | POA: Diagnosis not present

## 2015-12-14 DIAGNOSIS — M25512 Pain in left shoulder: Secondary | ICD-10-CM | POA: Diagnosis not present

## 2015-12-15 DIAGNOSIS — M722 Plantar fascial fibromatosis: Secondary | ICD-10-CM | POA: Diagnosis not present

## 2015-12-15 DIAGNOSIS — G8918 Other acute postprocedural pain: Secondary | ICD-10-CM | POA: Diagnosis not present

## 2015-12-15 DIAGNOSIS — M25512 Pain in left shoulder: Secondary | ICD-10-CM | POA: Diagnosis not present

## 2015-12-15 DIAGNOSIS — Y999 Unspecified external cause status: Secondary | ICD-10-CM | POA: Diagnosis not present

## 2015-12-15 DIAGNOSIS — S42292A Other displaced fracture of upper end of left humerus, initial encounter for closed fracture: Secondary | ICD-10-CM | POA: Diagnosis not present

## 2015-12-15 DIAGNOSIS — S42212A Unspecified displaced fracture of surgical neck of left humerus, initial encounter for closed fracture: Secondary | ICD-10-CM | POA: Diagnosis not present

## 2015-12-15 DIAGNOSIS — S52572A Other intraarticular fracture of lower end of left radius, initial encounter for closed fracture: Secondary | ICD-10-CM | POA: Diagnosis not present

## 2015-12-15 DIAGNOSIS — S52502A Unspecified fracture of the lower end of left radius, initial encounter for closed fracture: Secondary | ICD-10-CM | POA: Diagnosis not present

## 2015-12-15 DIAGNOSIS — M6701 Short Achilles tendon (acquired), right ankle: Secondary | ICD-10-CM | POA: Diagnosis not present

## 2015-12-21 ENCOUNTER — Other Ambulatory Visit: Payer: Self-pay | Admitting: *Deleted

## 2015-12-21 DIAGNOSIS — Z4789 Encounter for other orthopedic aftercare: Secondary | ICD-10-CM | POA: Diagnosis not present

## 2015-12-21 DIAGNOSIS — S52502D Unspecified fracture of the lower end of left radius, subsequent encounter for closed fracture with routine healing: Secondary | ICD-10-CM | POA: Diagnosis not present

## 2015-12-21 DIAGNOSIS — S42292D Other displaced fracture of upper end of left humerus, subsequent encounter for fracture with routine healing: Secondary | ICD-10-CM | POA: Diagnosis not present

## 2015-12-21 DIAGNOSIS — M25512 Pain in left shoulder: Secondary | ICD-10-CM | POA: Diagnosis not present

## 2015-12-21 MED ORDER — METOPROLOL SUCCINATE ER 25 MG PO TB24: 25.0000 mg | ORAL_TABLET | Freq: Every day | ORAL | 2 refills | Status: DC

## 2015-12-28 DIAGNOSIS — Z4789 Encounter for other orthopedic aftercare: Secondary | ICD-10-CM | POA: Diagnosis not present

## 2015-12-28 DIAGNOSIS — S42292D Other displaced fracture of upper end of left humerus, subsequent encounter for fracture with routine healing: Secondary | ICD-10-CM | POA: Diagnosis not present

## 2015-12-28 DIAGNOSIS — S42291D Other displaced fracture of upper end of right humerus, subsequent encounter for fracture with routine healing: Secondary | ICD-10-CM | POA: Diagnosis not present

## 2016-01-08 ENCOUNTER — Other Ambulatory Visit: Payer: Self-pay

## 2016-01-08 MED ORDER — EZETIMIBE 10 MG PO TABS: 10.0000 mg | ORAL_TABLET | Freq: Every day | ORAL | 1 refills | Status: DC

## 2016-01-08 MED ORDER — LOSARTAN POTASSIUM-HCTZ 100-25 MG PO TABS: ORAL_TABLET | ORAL | 2 refills | Status: DC

## 2016-01-08 NOTE — Telephone Encounter (Signed)
Refills complete 

## 2016-01-11 DIAGNOSIS — S52572D Other intraarticular fracture of lower end of left radius, subsequent encounter for closed fracture with routine healing: Secondary | ICD-10-CM | POA: Diagnosis not present

## 2016-01-11 DIAGNOSIS — S42292D Other displaced fracture of upper end of left humerus, subsequent encounter for fracture with routine healing: Secondary | ICD-10-CM | POA: Diagnosis not present

## 2016-01-11 DIAGNOSIS — Z4789 Encounter for other orthopedic aftercare: Secondary | ICD-10-CM | POA: Diagnosis not present

## 2016-01-19 DIAGNOSIS — S52572D Other intraarticular fracture of lower end of left radius, subsequent encounter for closed fracture with routine healing: Secondary | ICD-10-CM | POA: Diagnosis not present

## 2016-01-27 DIAGNOSIS — S52572D Other intraarticular fracture of lower end of left radius, subsequent encounter for closed fracture with routine healing: Secondary | ICD-10-CM | POA: Diagnosis not present

## 2016-02-01 DIAGNOSIS — Z4789 Encounter for other orthopedic aftercare: Secondary | ICD-10-CM | POA: Diagnosis not present

## 2016-02-01 DIAGNOSIS — S52572D Other intraarticular fracture of lower end of left radius, subsequent encounter for closed fracture with routine healing: Secondary | ICD-10-CM | POA: Diagnosis not present

## 2016-02-10 DIAGNOSIS — S52572D Other intraarticular fracture of lower end of left radius, subsequent encounter for closed fracture with routine healing: Secondary | ICD-10-CM | POA: Diagnosis not present

## 2016-02-16 DIAGNOSIS — S52572D Other intraarticular fracture of lower end of left radius, subsequent encounter for closed fracture with routine healing: Secondary | ICD-10-CM | POA: Diagnosis not present

## 2016-02-23 DIAGNOSIS — S52572D Other intraarticular fracture of lower end of left radius, subsequent encounter for closed fracture with routine healing: Secondary | ICD-10-CM | POA: Diagnosis not present

## 2016-02-29 DIAGNOSIS — Z4789 Encounter for other orthopedic aftercare: Secondary | ICD-10-CM | POA: Diagnosis not present

## 2016-02-29 DIAGNOSIS — Z23 Encounter for immunization: Secondary | ICD-10-CM | POA: Diagnosis not present

## 2016-02-29 DIAGNOSIS — G5602 Carpal tunnel syndrome, left upper limb: Secondary | ICD-10-CM | POA: Diagnosis not present

## 2016-02-29 DIAGNOSIS — S42292D Other displaced fracture of upper end of left humerus, subsequent encounter for fracture with routine healing: Secondary | ICD-10-CM | POA: Diagnosis not present

## 2016-02-29 DIAGNOSIS — S52572D Other intraarticular fracture of lower end of left radius, subsequent encounter for closed fracture with routine healing: Secondary | ICD-10-CM | POA: Diagnosis not present

## 2016-03-15 DIAGNOSIS — S52572D Other intraarticular fracture of lower end of left radius, subsequent encounter for closed fracture with routine healing: Secondary | ICD-10-CM | POA: Diagnosis not present

## 2016-03-21 DIAGNOSIS — S52572D Other intraarticular fracture of lower end of left radius, subsequent encounter for closed fracture with routine healing: Secondary | ICD-10-CM | POA: Diagnosis not present

## 2016-04-04 DIAGNOSIS — G5602 Carpal tunnel syndrome, left upper limb: Secondary | ICD-10-CM | POA: Diagnosis not present

## 2016-04-04 DIAGNOSIS — M25512 Pain in left shoulder: Secondary | ICD-10-CM | POA: Diagnosis not present

## 2016-04-04 DIAGNOSIS — Z4789 Encounter for other orthopedic aftercare: Secondary | ICD-10-CM | POA: Diagnosis not present

## 2016-04-06 DIAGNOSIS — S52572D Other intraarticular fracture of lower end of left radius, subsequent encounter for closed fracture with routine healing: Secondary | ICD-10-CM | POA: Diagnosis not present

## 2016-04-08 DIAGNOSIS — Z1231 Encounter for screening mammogram for malignant neoplasm of breast: Secondary | ICD-10-CM | POA: Diagnosis not present

## 2016-04-14 DIAGNOSIS — S52572D Other intraarticular fracture of lower end of left radius, subsequent encounter for closed fracture with routine healing: Secondary | ICD-10-CM | POA: Diagnosis not present

## 2016-04-15 DIAGNOSIS — H35372 Puckering of macula, left eye: Secondary | ICD-10-CM | POA: Diagnosis not present

## 2016-04-18 DIAGNOSIS — S52572D Other intraarticular fracture of lower end of left radius, subsequent encounter for closed fracture with routine healing: Secondary | ICD-10-CM | POA: Diagnosis not present

## 2016-04-27 DIAGNOSIS — S42291D Other displaced fracture of upper end of right humerus, subsequent encounter for fracture with routine healing: Secondary | ICD-10-CM | POA: Diagnosis not present

## 2016-04-29 DIAGNOSIS — S42291D Other displaced fracture of upper end of right humerus, subsequent encounter for fracture with routine healing: Secondary | ICD-10-CM | POA: Diagnosis not present

## 2016-05-02 DIAGNOSIS — S42291D Other displaced fracture of upper end of right humerus, subsequent encounter for fracture with routine healing: Secondary | ICD-10-CM | POA: Diagnosis not present

## 2016-06-06 DIAGNOSIS — L309 Dermatitis, unspecified: Secondary | ICD-10-CM | POA: Diagnosis not present

## 2016-06-09 DIAGNOSIS — H2512 Age-related nuclear cataract, left eye: Secondary | ICD-10-CM | POA: Diagnosis not present

## 2016-06-09 DIAGNOSIS — H2513 Age-related nuclear cataract, bilateral: Secondary | ICD-10-CM | POA: Diagnosis not present

## 2016-06-14 DIAGNOSIS — I773 Arterial fibromuscular dysplasia: Secondary | ICD-10-CM | POA: Diagnosis not present

## 2016-06-14 DIAGNOSIS — I10 Essential (primary) hypertension: Secondary | ICD-10-CM | POA: Diagnosis not present

## 2016-06-27 DIAGNOSIS — L299 Pruritus, unspecified: Secondary | ICD-10-CM | POA: Diagnosis not present

## 2016-06-27 DIAGNOSIS — L259 Unspecified contact dermatitis, unspecified cause: Secondary | ICD-10-CM | POA: Diagnosis not present

## 2016-06-29 DIAGNOSIS — L299 Pruritus, unspecified: Secondary | ICD-10-CM | POA: Diagnosis not present

## 2016-06-29 DIAGNOSIS — L259 Unspecified contact dermatitis, unspecified cause: Secondary | ICD-10-CM | POA: Diagnosis not present

## 2016-06-30 DIAGNOSIS — L299 Pruritus, unspecified: Secondary | ICD-10-CM | POA: Diagnosis not present

## 2016-06-30 DIAGNOSIS — L2389 Allergic contact dermatitis due to other agents: Secondary | ICD-10-CM | POA: Diagnosis not present

## 2016-06-30 DIAGNOSIS — L258 Unspecified contact dermatitis due to other agents: Secondary | ICD-10-CM | POA: Diagnosis not present

## 2016-08-16 NOTE — Patient Instructions (Signed)
[name]  08/16/2016     @PREFPERIOPPHARMACY @   Your procedure is scheduled on 08/22/2016.  Report to [facility] at 6:15 [facility].M.  Call this number if you have problems the morning of surgery:  [phone]   Remember:  Do not eat food or drink liquids after midnight.  Take these medicines the morning of surgery with A SIP OF WATER Prilosec and Toprol   Do not wear jewelry, make-up or nail polish.  Do not wear lotions, powders, or perfumes, or deoderant.  Do not shave 48 hours prior to surgery.  Men may shave face and neck.  Do not bring valuables to the hospital.  [facility] is not responsible for any belongings or valuables.  Contacts, dentures or bridgework may not be worn into surgery.  Leave your suitcase in the car.  After surgery it may be brought to your room.  For patients admitted to the hospital, discharge time will be determined by your treatment team.  Patients discharged the day of surgery will not be allowed to drive home.    Please read over the following fact sheets that you were given. Anesthesia Post-op Instructions     PATIENT INSTRUCTIONS POST-ANESTHESIA  IMMEDIATELY FOLLOWING SURGERY:  Do not drive or operate machinery for the first twenty four hours after surgery.  Do not make any important decisions for twenty four hours after surgery or while taking narcotic pain medications or sedatives.  If you develop intractable nausea and vomiting or a severe headache please notify your doctor immediately.  FOLLOW-UP:  Please make an appointment with your surgeon as instructed. You do not need to follow up with anesthesia unless specifically instructed to do so.  WOUND CARE INSTRUCTIONS (if applicable):  Keep a dry clean dressing on the anesthesia/puncture wound site if there is drainage.  Once the wound has quit draining you may leave it open to air.  Generally you should leave the bandage intact for twenty four hours unless there is drainage.  If the epidural  site drains for more than 36-48 hours please call the anesthesia department.  QUESTIONS?:  Please feel free to call your physician or the hospital operator if you have any questions, and they will be happy to assist you.      Cataract Surgery Cataract surgery is a procedure to remove a cataract from your eye. A cataract is cloudiness on the lens of your eye. The lens focuses light inside the eye. When a lens becomes cloudy, your vision is affected. Cataract surgery is a procedure to remove the cloudy lens. A substitute lens (intraocular lens or IOL) is usually inserted as a replacement for the cloudy lens. Tell a health care provider about:  Any allergies you have.  All medicines you are taking, including vitamins, herbs, eye drops, creams, and over-the-counter medicines.  Any problems you or family members have had with anesthetic medicines.  Any blood disorders you have.  Any surgeries you have had, especially eye surgeries that include refractive surgery, such as PRK and LASIK.  Any medical conditions you have.  Whether you are pregnant or may be pregnant. What are the risks? Generally, this is a safe procedure. However, problems may occur, including:  Infection.  Bleeding.  Glaucoma.  Retinal detachment.  Allergic reactions to medicines.  Damage to other structures or organs.  Inflammation of the eye.  Clouding of the part of your eye that holds an IOL in place (after-cataract), if an IOL was inserted. This is fairly  common.  An IOL moving out of position, if an IOL was inserted. This is very rare.  Loss of vision. This is rare.  What happens before the procedure?  Follow instructions from your health care provider about eating or drinking restrictions.  Ask your health care provider about: ? Changing or stopping your regular medicines, including any eye drops you have been prescribed. This is especially important if you are taking diabetes medicines or blood  thinners. ? Taking medicines such as aspirin and ibuprofen. These medicines can thin your blood. Do not take these medicines before your procedure if your health care provider instructs you not to.  Do not put contact lenses in either eye on the day of your surgery.  Plan for someone to drive you to and from the procedure.  If you will be going home right after the procedure, plan to have someone with you for 24 hours. What happens during the procedure?  An IV tube may be inserted into one of your veins.  You will be given one or more of the following: ? A medicine to help you relax (sedative). ? A medicine to numb the area (local anesthetic). This may be numbing eye drops or an injection that is given behind the eye.  A small cut (incision) will be made to the edge of the clear, dome-shaped surface that covers the front of the eye (cornea).  A small probe will be inserted into the eye. This device gives off ultrasound waves that soften and break up the cloudy center of the lens. This makes it easier for the cloudy lens to be removed by suction.  An IOL may be implanted.  Part of the capsule that surrounds the lens will be left in the eye to support the IOL.  Your surgeon may use stitches (sutures) to close the incision. The procedure may vary among health care providers and hospitals. What happens after the procedure?  Your blood pressure, heart rate, breathing rate, and blood oxygen level will be monitored often until the medicines you were given have worn off.  You may be given a protective shield to wear over your eyes.  Do not drive for 24 hours if you received a sedative. This information is not intended to replace advice given to you by your health care provider. Make sure you discuss any questions you have with your health care provider. Document Released: 02/17/2011 Document Revised: 08/06/2015 Document Reviewed: 01/08/2015 Elsevier Interactive Patient Education  2017  [employer]

## 2016-08-17 ENCOUNTER — Encounter (HOSPITAL_COMMUNITY)
Admission: RE | Admit: 2016-08-17 | Discharge: 2016-08-17 | Disposition: A | Discharge status: Home / Self Care | Payer: Medicare Other | Source: Ambulatory Visit | Attending: Ophthalmology | Admitting: Ophthalmology

## 2016-08-17 ENCOUNTER — Other Ambulatory Visit: Payer: Self-pay

## 2016-08-17 ENCOUNTER — Encounter (HOSPITAL_COMMUNITY): Payer: Self-pay

## 2016-08-17 DIAGNOSIS — Z01810 Encounter for preprocedural cardiovascular examination: Secondary | ICD-10-CM | POA: Insufficient documentation

## 2016-08-17 DIAGNOSIS — H2512 Age-related nuclear cataract, left eye: Secondary | ICD-10-CM | POA: Diagnosis not present

## 2016-08-17 DIAGNOSIS — R9431 Abnormal electrocardiogram [ECG] [EKG]: Secondary | ICD-10-CM | POA: Diagnosis not present

## 2016-08-17 DIAGNOSIS — Z01818 Encounter for other preprocedural examination: Secondary | ICD-10-CM | POA: Insufficient documentation

## 2016-08-17 LAB — BASIC METABOLIC PANEL
Anion gap: 6 (ref 5–15)
BUN: 17 mg/dL (ref 6–20)
CHLORIDE: 102 mmol/L (ref 101–111)
CO2: 31 mmol/L (ref 22–32)
CREATININE: 0.71 mg/dL (ref 0.44–1.00)
Calcium: 9 mg/dL (ref 8.9–10.3)
GFR calc non Af Amer: >60 mL/min (ref >60)
Glucose, Bld: 109 mg/dL — ABNORMAL HIGH (ref 65–99)
POTASSIUM: 3.6 mmol/L (ref 3.5–5.1)
Sodium: 139 mmol/L (ref 135–145)

## 2016-08-17 LAB — CBC
HEMATOCRIT: 41.7 % (ref 36.0–46.0)
HEMOGLOBIN: 13.4 g/dL (ref 12.0–15.0)
MCH: 30.3 pg (ref 26.0–34.0)
MCHC: 32.1 g/dL (ref 30.0–36.0)
MCV: 94.3 fL (ref 78.0–100.0)
Platelets: 209 10*3/uL (ref 150–400)
RBC: 4.42 MIL/uL (ref 3.87–5.11)
RDW: 13.4 % (ref 11.5–15.5)
WBC: 5.8 10*3/uL (ref 4.0–10.5)

## 2016-08-22 ENCOUNTER — Encounter (HOSPITAL_COMMUNITY): Payer: Self-pay | Admitting: *Deleted

## 2016-08-22 ENCOUNTER — Encounter (HOSPITAL_COMMUNITY)
Admission: RE | Disposition: A | Discharge status: Home / Self Care | Payer: Self-pay | Source: Ambulatory Visit | Attending: Ophthalmology

## 2016-08-22 ENCOUNTER — Ambulatory Visit (HOSPITAL_COMMUNITY): Payer: Medicare Other | Admitting: Anesthesiology

## 2016-08-22 ENCOUNTER — Ambulatory Visit (HOSPITAL_COMMUNITY)
Admission: RE | Admit: 2016-08-22 | Discharge: 2016-08-22 | Disposition: A | Discharge status: Home / Self Care | Payer: Medicare Other | Source: Ambulatory Visit | Attending: Ophthalmology | Admitting: Ophthalmology

## 2016-08-22 DIAGNOSIS — H2512 Age-related nuclear cataract, left eye: Secondary | ICD-10-CM | POA: Diagnosis not present

## 2016-08-22 DIAGNOSIS — K219 Gastro-esophageal reflux disease without esophagitis: Secondary | ICD-10-CM | POA: Diagnosis not present

## 2016-08-22 DIAGNOSIS — I10 Essential (primary) hypertension: Secondary | ICD-10-CM | POA: Diagnosis not present

## 2016-08-22 DIAGNOSIS — Z87891 Personal history of nicotine dependence: Secondary | ICD-10-CM | POA: Insufficient documentation

## 2016-08-22 HISTORY — PX: CATARACT EXTRACTION W/PHACO: SHX586

## 2016-08-22 SURGERY — PHACOEMULSIFICATION, CATARACT, WITH IOL INSERTION
Anesthesia: Monitor Anesthesia Care | Site: Eye | Laterality: Left

## 2016-08-22 MED ORDER — EPINEPHRINE PF 1 MG/ML IJ SOLN
INTRAMUSCULAR | Status: AC
  Filled 2016-08-22: qty 12

## 2016-08-22 MED ORDER — MIDAZOLAM HCL 2 MG/2ML IJ SOLN
1.0000 mg | INTRAMUSCULAR | Status: AC
  Administered 2016-08-22: 2 mg via INTRAVENOUS

## 2016-08-22 MED ORDER — PROVISC 10 MG/ML IO SOLN
INTRAOCULAR | Status: DC | PRN
  Administered 2016-08-22: 0.85 mL via INTRAOCULAR

## 2016-08-22 MED ORDER — NEOMYCIN-POLYMYXIN-DEXAMETH 3.5-10000-0.1 OP SUSP
OPHTHALMIC | Status: DC | PRN
  Administered 2016-08-22: 1 [drp] via OPHTHALMIC

## 2016-08-22 MED ORDER — POVIDONE-IODINE 5 % OP SOLN
OPHTHALMIC | Status: DC | PRN
  Administered 2016-08-22: 1 via OPHTHALMIC

## 2016-08-22 MED ORDER — LACTATED RINGERS IV SOLN
INTRAVENOUS | Status: DC
  Administered 2016-08-22: 07:00:00 via INTRAVENOUS

## 2016-08-22 MED ORDER — PHENYLEPHRINE HCL 2.5 % OP SOLN
1.0000 [drp] | OPHTHALMIC | Status: AC
  Administered 2016-08-22 (×3): 1 [drp] via OPHTHALMIC

## 2016-08-22 MED ORDER — CYCLOPENTOLATE-PHENYLEPHRINE 0.2-1 % OP SOLN
1.0000 [drp] | OPHTHALMIC | Status: AC
  Administered 2016-08-22 (×3): 1 [drp] via OPHTHALMIC

## 2016-08-22 MED ORDER — EPINEPHRINE PF 1 MG/ML IJ SOLN
INTRAOCULAR | Status: DC | PRN
  Administered 2016-08-22: 500 mL

## 2016-08-22 MED ORDER — LIDOCAINE HCL (PF) 1 % IJ SOLN
INTRAMUSCULAR | Status: AC
  Filled 2016-08-22: qty 6

## 2016-08-22 MED ORDER — LIDOCAINE HCL (PF) 1 % IJ SOLN
INTRAMUSCULAR | Status: DC | PRN
  Administered 2016-08-22: .6 mL

## 2016-08-22 MED ORDER — BSS IO SOLN
INTRAOCULAR | Status: DC | PRN
  Administered 2016-08-22: 15 mL via INTRAOCULAR

## 2016-08-22 MED ORDER — TETRACAINE HCL 0.5 % OP SOLN
OPHTHALMIC | Status: AC
  Filled 2016-08-22: qty 4

## 2016-08-22 MED ORDER — TETRACAINE HCL 0.5 % OP SOLN
1.0000 [drp] | OPHTHALMIC | Status: AC
  Administered 2016-08-22 (×3): 1 [drp] via OPHTHALMIC

## 2016-08-22 MED ORDER — FENTANYL CITRATE (PF) 100 MCG/2ML IJ SOLN
25.0000 ug | Freq: Once | INTRAMUSCULAR | Status: AC
  Administered 2016-08-22: 25 ug via INTRAVENOUS

## 2016-08-22 MED ORDER — FENTANYL CITRATE (PF) 100 MCG/2ML IJ SOLN
INTRAMUSCULAR | Status: AC
  Filled 2016-08-22: qty 2

## 2016-08-22 MED ORDER — LIDOCAINE HCL 3.5 % OP GEL
1.0000 "application " | Freq: Once | OPHTHALMIC | Status: AC
  Administered 2016-08-22: 1 via OPHTHALMIC

## 2016-08-22 MED ORDER — MIDAZOLAM HCL 2 MG/2ML IJ SOLN
INTRAMUSCULAR | Status: AC
  Filled 2016-08-22: qty 2

## 2016-08-22 SURGICAL SUPPLY — 9 items
CLOTH BEACON ORANGE TIMEOUT ST (SAFETY) ×1 IMPLANT
EYE SHIELD UNIVERSAL CLEAR (GAUZE/BANDAGES/DRESSINGS) ×1 IMPLANT
GLOVE BIOGEL PI IND STRL 7.0 (GLOVE) IMPLANT
GLOVE BIOGEL PI INDICATOR 7.0 (GLOVE) ×2
PAD ARMBOARD 7.5X6 YLW CONV (MISCELLANEOUS) ×1 IMPLANT
SIGHTPATH CAT PROC W REG LENS (Ophthalmic Related) ×2 IMPLANT
SYRINGE LUER LOK 1CC (MISCELLANEOUS) ×1 IMPLANT
TAPE TRANSPARENT 1/2IN (GAUZE/BANDAGES/DRESSINGS) ×1 IMPLANT
WATER STERILE IRR 250ML POUR (IV SOLUTION) ×1 IMPLANT

## 2016-08-22 NOTE — Anesthesia Procedure Notes (Signed)
Procedure Name: MAC Date/Time: 08/22/2016 7:25 AM Performed by: Vista Deck Pre-anesthesia Checklist: Patient identified, Emergency Drugs available, Suction available, Timeout performed and Patient being monitored Patient Re-evaluated:Patient Re-evaluated prior to inductionOxygen Delivery Method: Nasal Cannula

## 2016-08-22 NOTE — Transfer of Care (Signed)
Immediate Anesthesia Transfer of Care Note  Patient: [name]  Procedure(s) Performed: Procedure(s) (LRB): CATARACT EXTRACTION PHACO AND INTRAOCULAR LENS PLACEMENT (IOC) (Left)  Patient Location: Shortstay  Anesthesia Type: MAC  Level of Consciousness: awake  Airway & Oxygen Therapy: Patient Spontanous Breathing   Post-op Assessment: Report given to PACU RN, Post -op Vital signs reviewed and stable and Patient moving all extremities  Post vital signs: Reviewed and stable  Complications: No apparent anesthesia complications

## 2016-08-22 NOTE — Op Note (Signed)
Date of Admission: 08/22/2016  Date of Surgery: 08/22/2016  Pre-Op Dx: Cataract Left  Eye  Post-Op Dx: Senile Nuclear Cataract  Left  Eye,  Dx Code H25.12  Surgeon: Tonny Branch, M.D.  Assistants: None  Anesthesia: Topical with MAC  Indications: Painless, progressive loss of vision with compromise of daily activities.  Surgery: Cataract Extraction with Intraocular lens Implant Left Eye  Discription: The patient had dilating drops and viscous lidocaine placed into the Left eye in the pre-op holding area. After transfer to the operating room, a time out was performed. The patient was then prepped and draped. Beginning with a 68m blade a paracentesis port was made at the surgeon's 2 o'clock position. The anterior chamber was then filled with 1% non-preserved lidocaine. This was followed by filling the anterior chamber with Provisc.  A 2.469mkeratome blade was used to make a clear corneal incision at the temporal limbus.  A bent cystatome needle was used to create a continuous tear capsulotomy. Hydrodissection was performed with balanced salt solution on a Fine canula. The lens nucleus was then removed using the phacoemulsification handpiece. Residual cortex was removed with the I&A handpiece. The anterior chamber and capsular bag were refilled with Provisc. A posterior chamber intraocular lens was placed into the capsular bag with it's injector. The implant was positioned with the Kuglan hook. The Provisc was then removed from the anterior chamber and capsular bag with the I&A handpiece. Stromal hydration of the main incision and paracentesis port was performed with BSS on a Fine canula. The wounds were tested for leak which was negative. The patient tolerated the procedure well. There were no operative complications. The patient was then transferred to the recovery room in stable condition.  Complications: None  Specimen: None  EBL: None  Prosthetic device: Abbott Technis, PCB00, power 21.5, SN  [id number]

## 2016-08-22 NOTE — Discharge Instructions (Signed)

## 2016-08-22 NOTE — H&P (Signed)
I have reviewed the H&P, the patient was re-examined, and I have identified no interval changes in medical condition and plan of care since the history and physical of record  

## 2016-08-22 NOTE — Anesthesia Postprocedure Evaluation (Signed)
Anesthesia Post Note  Patient: [name]  Procedure(s) Performed: Procedure(s) (LRB): CATARACT EXTRACTION PHACO AND INTRAOCULAR LENS PLACEMENT (IOC) (Left)  Patient location during evaluation: Short Stay Anesthesia Type: MAC Level of consciousness: awake and alert Pain management: satisfactory to patient Vital Signs Assessment: post-procedure vital signs reviewed and stable Respiratory status: spontaneous breathing Cardiovascular status: stable Postop Assessment: no signs of nausea or vomiting Anesthetic complications: no     Last Vitals:  Vitals:   08/22/16 0713 08/22/16 0746  BP: 119/70 (!) 143/78  Pulse:  64  Resp: (!) 28 18  Temp:  36.6 C    Last Pain:  Vitals:   08/22/16 0634  TempSrc: Oral                 [name]

## 2016-08-22 NOTE — Anesthesia Preprocedure Evaluation (Signed)
Anesthesia Evaluation  Patient identified by MRN, date of birth, ID band Patient awake    Reviewed: Allergy & Precautions, NPO status , Patient's Chart, lab work & pertinent test results  Airway Mallampati: I  TM Distance: >3 FB     Dental  (+) Teeth Intact   Pulmonary neg pulmonary ROS, former smoker,    breath sounds clear to auscultation       Cardiovascular hypertension, Pt. on medications  Rhythm:Regular Rate:Normal     Neuro/Psych    GI/Hepatic negative GI ROS, Neg liver ROS, GERD  Medicated and Controlled,  Endo/Other    Renal/GU      Musculoskeletal   Abdominal   Peds  Hematology   Anesthesia Other Findings   Reproductive/Obstetrics                             Anesthesia Physical Anesthesia Plan  ASA: II  Anesthesia Plan: MAC   Post-op Pain Management:    Induction: Intravenous  PONV Risk Score and Plan:   Airway Management Planned: Nasal Cannula  Additional Equipment:   Intra-op Plan:   Post-operative Plan:   Informed Consent: I have reviewed the patients History and Physical, chart, labs and discussed the procedure including the risks, benefits and alternatives for the proposed anesthesia with the patient or authorized representative who has indicated his/her understanding and acceptance.     Plan Discussed with:   Anesthesia Plan Comments:         Anesthesia Quick Evaluation

## 2016-08-24 ENCOUNTER — Encounter (HOSPITAL_COMMUNITY): Payer: Self-pay | Admitting: Ophthalmology

## 2016-08-29 DIAGNOSIS — H2511 Age-related nuclear cataract, right eye: Secondary | ICD-10-CM | POA: Diagnosis not present

## 2016-09-12 ENCOUNTER — Encounter (HOSPITAL_COMMUNITY)
Admission: RE | Admit: 2016-09-12 | Discharge: 2016-09-12 | Disposition: A | Discharge status: Home / Self Care | Payer: Medicare Other | Source: Ambulatory Visit | Attending: Ophthalmology | Admitting: Ophthalmology

## 2016-09-12 DIAGNOSIS — Z0000 Encounter for general adult medical examination without abnormal findings: Secondary | ICD-10-CM | POA: Diagnosis not present

## 2016-09-13 ENCOUNTER — Other Ambulatory Visit (HOSPITAL_COMMUNITY): Payer: Self-pay | Admitting: Pulmonary Disease

## 2016-09-13 ENCOUNTER — Other Ambulatory Visit: Payer: Self-pay | Admitting: Cardiovascular Disease

## 2016-09-13 DIAGNOSIS — Z0000 Encounter for general adult medical examination without abnormal findings: Secondary | ICD-10-CM | POA: Diagnosis not present

## 2016-09-13 DIAGNOSIS — I773 Arterial fibromuscular dysplasia: Secondary | ICD-10-CM | POA: Diagnosis not present

## 2016-09-13 DIAGNOSIS — Z780 Asymptomatic menopausal state: Secondary | ICD-10-CM

## 2016-09-13 DIAGNOSIS — I10 Essential (primary) hypertension: Secondary | ICD-10-CM | POA: Diagnosis not present

## 2016-09-13 DIAGNOSIS — G4700 Insomnia, unspecified: Secondary | ICD-10-CM | POA: Diagnosis not present

## 2016-09-16 MED ORDER — LIDOCAINE HCL 3.5 % OP GEL
OPHTHALMIC | Status: AC
  Filled 2016-09-16: qty 1

## 2016-09-16 MED ORDER — TETRACAINE HCL 0.5 % OP SOLN
OPHTHALMIC | Status: AC
Start: 2016-09-16 — End: ?
  Filled 2016-09-16: qty 4

## 2016-09-16 MED ORDER — LIDOCAINE HCL (PF) 1 % IJ SOLN
INTRAMUSCULAR | Status: AC
  Filled 2016-09-16: qty 2

## 2016-09-16 MED ORDER — CYCLOPENTOLATE-PHENYLEPHRINE 0.2-1 % OP SOLN
OPHTHALMIC | Status: AC
  Filled 2016-09-16: qty 2

## 2016-09-16 MED ORDER — NEOMYCIN-POLYMYXIN-DEXAMETH 3.5-10000-0.1 OP SUSP
OPHTHALMIC | Status: AC
  Filled 2016-09-16: qty 5

## 2016-09-19 ENCOUNTER — Encounter (HOSPITAL_COMMUNITY): Payer: Self-pay | Admitting: *Deleted

## 2016-09-19 ENCOUNTER — Encounter (HOSPITAL_COMMUNITY)
Admission: RE | Disposition: A | Discharge status: Home / Self Care | Payer: Self-pay | Source: Ambulatory Visit | Attending: Ophthalmology

## 2016-09-19 ENCOUNTER — Ambulatory Visit (HOSPITAL_COMMUNITY)
Admission: RE | Admit: 2016-09-19 | Discharge: 2016-09-19 | Disposition: A | Discharge status: Home / Self Care | Payer: Medicare Other | Source: Ambulatory Visit | Attending: Ophthalmology | Admitting: Ophthalmology

## 2016-09-19 ENCOUNTER — Ambulatory Visit (HOSPITAL_COMMUNITY): Payer: Medicare Other | Admitting: Anesthesiology

## 2016-09-19 DIAGNOSIS — H269 Unspecified cataract: Secondary | ICD-10-CM | POA: Diagnosis not present

## 2016-09-19 DIAGNOSIS — Z90710 Acquired absence of both cervix and uterus: Secondary | ICD-10-CM | POA: Insufficient documentation

## 2016-09-19 DIAGNOSIS — M1990 Unspecified osteoarthritis, unspecified site: Secondary | ICD-10-CM | POA: Insufficient documentation

## 2016-09-19 DIAGNOSIS — Z87891 Personal history of nicotine dependence: Secondary | ICD-10-CM | POA: Insufficient documentation

## 2016-09-19 DIAGNOSIS — K219 Gastro-esophageal reflux disease without esophagitis: Secondary | ICD-10-CM | POA: Diagnosis not present

## 2016-09-19 DIAGNOSIS — H2511 Age-related nuclear cataract, right eye: Secondary | ICD-10-CM | POA: Insufficient documentation

## 2016-09-19 DIAGNOSIS — I10 Essential (primary) hypertension: Secondary | ICD-10-CM | POA: Insufficient documentation

## 2016-09-19 HISTORY — PX: CATARACT EXTRACTION W/PHACO: SHX586

## 2016-09-19 SURGERY — PHACOEMULSIFICATION, CATARACT, WITH IOL INSERTION
Anesthesia: Monitor Anesthesia Care | Site: Eye | Laterality: Right

## 2016-09-19 MED ORDER — PROVISC 10 MG/ML IO SOLN
INTRAOCULAR | Status: DC | PRN
  Administered 2016-09-19: 0.85 mL via INTRAOCULAR

## 2016-09-19 MED ORDER — POVIDONE-IODINE 5 % OP SOLN
OPHTHALMIC | Status: DC | PRN
  Administered 2016-09-19: 1 via OPHTHALMIC

## 2016-09-19 MED ORDER — FENTANYL CITRATE (PF) 100 MCG/2ML IJ SOLN
25.0000 ug | Freq: Once | INTRAMUSCULAR | Status: AC
  Administered 2016-09-19: 25 ug via INTRAVENOUS

## 2016-09-19 MED ORDER — BSS IO SOLN
INTRAOCULAR | Status: DC | PRN
  Administered 2016-09-19: 15 mL

## 2016-09-19 MED ORDER — LACTATED RINGERS IV SOLN
INTRAVENOUS | Status: DC
  Administered 2016-09-19: 1000 mL via INTRAVENOUS

## 2016-09-19 MED ORDER — LIDOCAINE HCL 3.5 % OP GEL
1.0000 "application " | Freq: Once | OPHTHALMIC | Status: AC
  Administered 2016-09-19: 1 via OPHTHALMIC

## 2016-09-19 MED ORDER — TETRACAINE HCL 0.5 % OP SOLN
1.0000 [drp] | OPHTHALMIC | Status: AC
  Administered 2016-09-19 (×3): 1 [drp] via OPHTHALMIC

## 2016-09-19 MED ORDER — EPINEPHRINE PF 1 MG/ML IJ SOLN
INTRAOCULAR | Status: DC | PRN
  Administered 2016-09-19: 500 mL

## 2016-09-19 MED ORDER — MIDAZOLAM HCL 2 MG/2ML IJ SOLN
INTRAMUSCULAR | Status: AC
  Filled 2016-09-19: qty 2

## 2016-09-19 MED ORDER — MIDAZOLAM HCL 2 MG/2ML IJ SOLN
1.0000 mg | INTRAMUSCULAR | Status: AC
  Administered 2016-09-19: 2 mg via INTRAVENOUS

## 2016-09-19 MED ORDER — PHENYLEPHRINE HCL 2.5 % OP SOLN
1.0000 [drp] | OPHTHALMIC | Status: AC
  Administered 2016-09-19 (×3): 1 [drp] via OPHTHALMIC

## 2016-09-19 MED ORDER — FENTANYL CITRATE (PF) 100 MCG/2ML IJ SOLN
INTRAMUSCULAR | Status: AC
  Filled 2016-09-19: qty 2

## 2016-09-19 MED ORDER — MIDAZOLAM HCL 2 MG/2ML IJ SOLN
INTRAMUSCULAR | Status: DC | PRN
Start: 2016-09-19 — End: 2016-09-19
  Administered 2016-09-19: 2 mg via INTRAVENOUS

## 2016-09-19 MED ORDER — LIDOCAINE HCL (PF) 1 % IJ SOLN
INTRAMUSCULAR | Status: DC | PRN
  Administered 2016-09-19: .6 mL

## 2016-09-19 MED ORDER — NEOMYCIN-POLYMYXIN-DEXAMETH 3.5-10000-0.1 OP SUSP
OPHTHALMIC | Status: DC | PRN
  Administered 2016-09-19: 2 [drp] via OPHTHALMIC

## 2016-09-19 MED ORDER — CYCLOPENTOLATE-PHENYLEPHRINE 0.2-1 % OP SOLN
1.0000 [drp] | OPHTHALMIC | Status: AC
  Administered 2016-09-19 (×3): 1 [drp] via OPHTHALMIC

## 2016-09-19 SURGICAL SUPPLY — 10 items
CLOTH BEACON ORANGE TIMEOUT ST (SAFETY) ×1 IMPLANT
EYE SHIELD UNIVERSAL CLEAR (GAUZE/BANDAGES/DRESSINGS) ×1 IMPLANT
GLOVE BIOGEL PI IND STRL 7.0 (GLOVE) IMPLANT
GLOVE BIOGEL PI INDICATOR 7.0 (GLOVE) ×2
LENS ALC ACRYL/TECN (Ophthalmic Related) ×1 IMPLANT
PAD ARMBOARD 7.5X6 YLW CONV (MISCELLANEOUS) ×1 IMPLANT
SYRINGE LUER LOK 1CC (MISCELLANEOUS) ×1 IMPLANT
TAPE SURG TRANSPORE 1 IN (GAUZE/BANDAGES/DRESSINGS) IMPLANT
TAPE SURGICAL TRANSPORE 1 IN (GAUZE/BANDAGES/DRESSINGS) ×1
WATER STERILE IRR 250ML POUR (IV SOLUTION) ×1 IMPLANT

## 2016-09-19 NOTE — Op Note (Signed)
Date of Admission: 09/19/2016  Date of Surgery: 09/19/2016  Pre-Op Dx: Cataract Right  Eye  Post-Op Dx: Senile Nuclear Cataract  Right  Eye,  Dx Code H25.11  Surgeon: Tonny Branch, M.D.  Assistants: None  Anesthesia: Topical with MAC  Indications: Painless, progressive loss of vision with compromise of daily activities.  Surgery: Cataract Extraction with Intraocular lens Implant Right Eye  Discription: The patient had dilating drops and viscous lidocaine placed into the Right eye in the pre-op holding area. After transfer to the operating room, a time out was performed. The patient was then prepped and draped. Beginning with a 11m blade a paracentesis port was made at the surgeon's 2 o'clock position. The anterior chamber was then filled with 1% non-preserved lidocaine. This was followed by filling the anterior chamber with Provisc.  A 2.459mkeratome blade was used to make a clear corneal incision at the temporal limbus.  A bent cystatome needle was used to create a continuous tear capsulotomy. Hydrodissection was performed with balanced salt solution on a Fine canula. The lens nucleus was then removed using the phacoemulsification handpiece. Residual cortex was removed with the I&A handpiece. The anterior chamber and capsular bag were refilled with Provisc. A posterior chamber intraocular lens was placed into the capsular bag with it's injector. The implant was positioned with the Kuglan hook. The Provisc was then removed from the anterior chamber and capsular bag with the I&A handpiece. Stromal hydration of the main incision and paracentesis port was performed with BSS on a Fine canula. The wounds were tested for leak which was negative. The patient tolerated the procedure well. There were no operative complications. The patient was then transferred to the recovery room in stable condition.  Complications: None  Specimen: None  EBL: None  Prosthetic device: Abbott Technis, PCB00, power 23.0, SN  [id number]

## 2016-09-19 NOTE — Anesthesia Postprocedure Evaluation (Signed)
Anesthesia Post Note  Patient: [name]  Procedure(s) Performed: Procedure(s) (LRB): CATARACT EXTRACTION PHACO AND INTRAOCULAR LENS PLACEMENT (IOC) (Right)  Patient location during evaluation: Short Stay Anesthesia Type: MAC Level of consciousness: awake and alert Pain management: satisfactory to patient Vital Signs Assessment: post-procedure vital signs reviewed and stable Postop Assessment: no signs of nausea or vomiting Anesthetic complications: no     Last Vitals:  Vitals:   09/19/16 0715 09/19/16 0737  BP: (!) 181/94 (!) 142/73  Pulse:  71  Resp: (!) 28 18  Temp:  36.7 C    Last Pain:  Vitals:   09/19/16 0737  TempSrc: Oral                 Lizza Huffaker

## 2016-09-19 NOTE — H&P (Signed)
I have reviewed the H&P, the patient was re-examined, and I have identified no interval changes in medical condition and plan of care since the history and physical of record  

## 2016-09-19 NOTE — Discharge Instructions (Signed)

## 2016-09-19 NOTE — Anesthesia Procedure Notes (Signed)
Procedure Name: MAC Date/Time: 09/19/2016 7:18 AM Performed by: Vista Deck Pre-anesthesia Checklist: Patient identified, Emergency Drugs available, Suction available, Timeout performed and Patient being monitored Patient Re-evaluated:Patient Re-evaluated prior to inductionOxygen Delivery Method: Nasal Cannula

## 2016-09-19 NOTE — Transfer of Care (Signed)
Immediate Anesthesia Transfer of Care Note  Patient: [name]  Procedure(s) Performed: Procedure(s) (LRB): CATARACT EXTRACTION PHACO AND INTRAOCULAR LENS PLACEMENT (IOC) (Right)  Patient Location: Shortstay  Anesthesia Type: MAC  Level of Consciousness: awake  Airway & Oxygen Therapy: Patient Spontanous Breathing   Post-op Assessment: Report given to PACU RN, Post -op Vital signs reviewed and stable and Patient moving all extremities  Post vital signs: Reviewed and stable  Complications: No apparent anesthesia complications

## 2016-09-19 NOTE — Anesthesia Preprocedure Evaluation (Signed)
Anesthesia Evaluation  Patient identified by MRN, date of birth, ID band Patient awake    Reviewed: Allergy & Precautions, NPO status , Patient's Chart, lab work & pertinent test results  Airway Mallampati: I  TM Distance: >3 FB     Dental  (+) Teeth Intact   Pulmonary neg pulmonary ROS, former smoker,    breath sounds clear to auscultation       Cardiovascular hypertension, Pt. on medications  Rhythm:Regular Rate:Normal     Neuro/Psych    GI/Hepatic negative GI ROS, Neg liver ROS, GERD  Medicated and Controlled,  Endo/Other    Renal/GU      Musculoskeletal   Abdominal   Peds  Hematology   Anesthesia Other Findings   Reproductive/Obstetrics                             Anesthesia Physical Anesthesia Plan  ASA: II  Anesthesia Plan: MAC   Post-op Pain Management:    Induction: Intravenous  PONV Risk Score and Plan:   Airway Management Planned: Nasal Cannula  Additional Equipment:   Intra-op Plan:   Post-operative Plan:   Informed Consent: I have reviewed the patients History and Physical, chart, labs and discussed the procedure including the risks, benefits and alternatives for the proposed anesthesia with the patient or authorized representative who has indicated his/her understanding and acceptance.     Plan Discussed with:   Anesthesia Plan Comments:         Anesthesia Quick Evaluation

## 2016-09-20 ENCOUNTER — Encounter (HOSPITAL_COMMUNITY): Payer: Self-pay | Admitting: Ophthalmology

## 2016-09-22 ENCOUNTER — Encounter (HOSPITAL_COMMUNITY): Payer: Self-pay | Admitting: *Deleted

## 2016-09-22 DIAGNOSIS — S42271A Torus fracture of upper end of right humerus, initial encounter for closed fracture: Secondary | ICD-10-CM | POA: Diagnosis not present

## 2016-09-22 NOTE — H&P (Signed)
[name] is an 83 y.o. female.    Chief Complaint: right shoulder pain  HPI: Pt is a 83 y.o. female complaining of right shoulder pain after a fall earlier today. Pain had continually increased since the beginning. X-rays in the clinic show proximal humerus fracture with complete displacement. . Various options are discussed with the patient. Risks, benefits and expectations were discussed with the patient. Patient understand the risks, benefits and expectations and wishes to proceed with surgery.   PCP:  Kari Baars, MD  D/C Plans: Home  PMH: Past Medical History:  Diagnosis Date  . Fibrocystic breast disease   . H/O: hysterectomy   . Hypercholesterolemia   . 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 ABDOMINAL HYSTERECTOMY    . BELPHAROPTOSIS REPAIR    . breast biopsies     for benign lumps  . BREAST LUMPECTOMY  1985  . CATARACT EXTRACTION W/PHACO Left 08/22/2016   Procedure: CATARACT EXTRACTION PHACO AND INTRAOCULAR LENS PLACEMENT (IOC);  Surgeon: Gemma Payor, MD;  Location: [facility];  Service: Ophthalmology;  Laterality: Left;  CDE: 8.63  . CATARACT EXTRACTION W/PHACO Right 09/19/2016   Procedure: CATARACT EXTRACTION PHACO AND INTRAOCULAR LENS PLACEMENT (IOC);  Surgeon: Gemma Payor, MD;  Location: [facility];  Service: Ophthalmology;  Laterality: Right;  CDE: 9.96  . RENAL ARTERY ANGIOPLASTY  1987   right Dr. Jean Rosenthal  . TONSILLECTOMY    . TOTAL HIP ARTHROPLASTY     right  . VEIN LIGATION  1974   both legs  . WRIST FRACTURE SURGERY Right 2002  . WRIST FRACTURE SURGERY Left 2017    Social History:  reports that she quit smoking about 29 years ago. She has never used smokeless tobacco. She reports that she does not drink alcohol or use drugs.  Allergies:  Allergies  Allergen Reactions  . Baycol [Cerivastatin Sodium] Itching  . Crestor [Rosuvastatin Calcium] Itching  . Lipitor [Atorvastatin Calcium] Itching  .  Morphine And Related Other (See Comments)    Hallucinations , skin crawling, "feels like elephants are crawling"  . Pravachol Other (See Comments)    Pt doesn't recall reaction  . Propylene Glycol Itching    Severe itching  . Balsam [place]-Castor [Amberderm] Itching and Rash  . Ciprofloxacin Hives and Rash  . Diphenhydramine Hcl Rash  . Other Itching and Rash    Botanicals , Fragrance , methyldibromo glutaronitrile, detergents can only use "All free and clear"    Medications: No current facility-administered medications for this encounter.    Current Outpatient Prescriptions  Medication Sig Dispense Refill  . Biotin 5000 MCG CAPS Take 5,000 mcg by mouth daily.    . Cyanocobalamin (B-12) 2500 MCG TABS Take 2,500 mcg by mouth daily.    . Emollient (CERAVE) LOTN Apply 1 application topically 3 (three) times daily as needed (itching).    . ezetimibe (ZETIA) 10 MG tablet TAKE ONE TABLET BY MOUTH DAILY. 90 tablet 3  . losartan-hydrochlorothiazide (HYZAAR) 100-25 MG tablet TAKE ONE (1) TABLET BY MOUTH EVERY DAY 90 tablet 2  . metoprolol succinate (TOPROL-XL) 25 MG 24 hr tablet Take 1 tablet (25 mg total) by mouth daily. 90 tablet 2  . omeprazole (PRILOSEC OTC) 20 MG tablet Take 20 mg by mouth daily.    . potassium chloride (K-DUR,KLOR-CON) 10 MEQ tablet Take 1 tablet (10 mEq total) by mouth daily. 90 tablet 3  . pyridoxine (B-6) 100 MG tablet Take 100  mg by mouth daily.    . temazepam (RESTORIL) 15 MG capsule Take 15-30 mg by mouth at bedtime as needed for sleep (sleep). Reported on 08/26/2015      No results found for this or any previous visit (from the past 48 hour(s)). No results found.  ROS: Pain with rom of the right upper extremity  Physical Exam:  Alert and oriented 83 y.o. female in no acute distress Cranial nerves 2-12 intact Cervical spine: full rom with no tenderness, nv intact distally Chest: active breath sounds bilaterally, no wheeze rhonchi or rales Heart: regular rate  and rhythm, no murmur Abd: non tender non distended with active bowel sounds Hip is stable with rom  Right shoulder with mild edema Unable to lift or move arm due to recent fracture nv intact distally No rashes or edema distally  Assessment/Plan Assessment: right proximal humerus fracture  Plan: Patient will undergo a right reverse total shoulder for fracture [facility] Risks benefits and expectations were discussed with the patient. Patient understand risks, benefits and expectations and wishes to proceed.

## 2016-09-22 NOTE — Progress Notes (Signed)
Pt denies SOB and chest pain. Pt under the care of Dr. Purvis SheffieldKoneswaran, Cardiology. Pt denies having a stress test, echo and cardiac cath. Pt denies having a chest x ray within the last year. Pt denies recent labs. Pt made aware to stop taking Aspirin, vitamins, fish oil, Biotin and herbal medications. Do not take any NSAIDs ie: Ibuprofen, Advil, Naproxen (Aleve), Motrin, BC and Goody Powder or any medication containing Aspirin. Pt verbalized understanding of all pre-op instructions.

## 2016-09-23 ENCOUNTER — Inpatient Hospital Stay (HOSPITAL_COMMUNITY)
Admission: RE | Admit: 2016-09-23 | Discharge: 2016-09-25 | DRG: 483 | Disposition: A | Discharge status: Home / Self Care | Payer: Medicare Other | Source: Ambulatory Visit | Attending: Orthopedic Surgery | Admitting: Orthopedic Surgery

## 2016-09-23 ENCOUNTER — Encounter (HOSPITAL_COMMUNITY)
Admission: RE | Disposition: A | Discharge status: Home / Self Care | Payer: Self-pay | Source: Ambulatory Visit | Attending: Orthopedic Surgery

## 2016-09-23 ENCOUNTER — Inpatient Hospital Stay (HOSPITAL_COMMUNITY): Payer: Medicare Other | Admitting: Anesthesiology

## 2016-09-23 ENCOUNTER — Inpatient Hospital Stay (HOSPITAL_COMMUNITY): Payer: Medicare Other

## 2016-09-23 ENCOUNTER — Encounter (HOSPITAL_COMMUNITY): Payer: Self-pay

## 2016-09-23 DIAGNOSIS — I10 Essential (primary) hypertension: Secondary | ICD-10-CM | POA: Diagnosis present

## 2016-09-23 DIAGNOSIS — K219 Gastro-esophageal reflux disease without esophagitis: Secondary | ICD-10-CM | POA: Diagnosis not present

## 2016-09-23 DIAGNOSIS — W1830XA Fall on same level, unspecified, initial encounter: Secondary | ICD-10-CM | POA: Diagnosis present

## 2016-09-23 DIAGNOSIS — E7800 Pure hypercholesterolemia, unspecified: Secondary | ICD-10-CM | POA: Diagnosis present

## 2016-09-23 DIAGNOSIS — M25511 Pain in right shoulder: Secondary | ICD-10-CM | POA: Diagnosis not present

## 2016-09-23 DIAGNOSIS — Z96641 Presence of right artificial hip joint: Secondary | ICD-10-CM | POA: Diagnosis not present

## 2016-09-23 DIAGNOSIS — Z96611 Presence of right artificial shoulder joint: Secondary | ICD-10-CM

## 2016-09-23 DIAGNOSIS — Z87891 Personal history of nicotine dependence: Secondary | ICD-10-CM | POA: Diagnosis not present

## 2016-09-23 DIAGNOSIS — Z90710 Acquired absence of both cervix and uterus: Secondary | ICD-10-CM

## 2016-09-23 DIAGNOSIS — S42241A 4-part fracture of surgical neck of right humerus, initial encounter for closed fracture: Principal | ICD-10-CM | POA: Diagnosis present

## 2016-09-23 DIAGNOSIS — S42201A Unspecified fracture of upper end of right humerus, initial encounter for closed fracture: Secondary | ICD-10-CM | POA: Diagnosis not present

## 2016-09-23 DIAGNOSIS — Z471 Aftercare following joint replacement surgery: Secondary | ICD-10-CM | POA: Diagnosis not present

## 2016-09-23 DIAGNOSIS — G8918 Other acute postprocedural pain: Secondary | ICD-10-CM | POA: Diagnosis not present

## 2016-09-23 DIAGNOSIS — S42291A Other displaced fracture of upper end of right humerus, initial encounter for closed fracture: Secondary | ICD-10-CM | POA: Diagnosis not present

## 2016-09-23 HISTORY — DX: Gastro-esophageal reflux disease without esophagitis: K21.9

## 2016-09-23 HISTORY — DX: Unspecified fracture of shaft of humerus, unspecified arm, initial encounter for closed fracture: S42.309A

## 2016-09-23 HISTORY — PX: REVERSE SHOULDER ARTHROPLASTY: SHX5054

## 2016-09-23 LAB — BASIC METABOLIC PANEL
ANION GAP: 7 (ref 5–15)
BUN: 10 mg/dL (ref 6–20)
CHLORIDE: 102 mmol/L (ref 101–111)
CO2: 31 mmol/L (ref 22–32)
Calcium: 9.3 mg/dL (ref 8.9–10.3)
Creatinine, Ser: 0.85 mg/dL (ref 0.44–1.00)
GFR calc Af Amer: >60 mL/min (ref >60)
GLUCOSE: 123 mg/dL — AB (ref 65–99)
POTASSIUM: 3.8 mmol/L (ref 3.5–5.1)
Sodium: 140 mmol/L (ref 135–145)

## 2016-09-23 LAB — CBC
HEMATOCRIT: 40.2 % (ref 36.0–46.0)
HEMOGLOBIN: 12.9 g/dL (ref 12.0–15.0)
MCH: 29.9 pg (ref 26.0–34.0)
MCHC: 32.1 g/dL (ref 30.0–36.0)
MCV: 93.1 fL (ref 78.0–100.0)
PLATELETS: 212 10*3/uL (ref 150–400)
RBC: 4.32 MIL/uL (ref 3.87–5.11)
RDW: 13.5 % (ref 11.5–15.5)
WBC: 10.4 10*3/uL (ref 4.0–10.5)

## 2016-09-23 SURGERY — ARTHROPLASTY, SHOULDER, TOTAL, REVERSE
Anesthesia: General | Site: Shoulder | Laterality: Right

## 2016-09-23 MED ORDER — MEPERIDINE HCL 25 MG/ML IJ SOLN: 6.2500 mg | INTRAMUSCULAR | Status: DC | PRN

## 2016-09-23 MED ORDER — CEFAZOLIN SODIUM-DEXTROSE 2-4 GM/100ML-% IV SOLN
2.0000 g | Freq: Four times a day (QID) | INTRAVENOUS | Status: AC
  Administered 2016-09-23 – 2016-09-24 (×3): 2 g via INTRAVENOUS
  Filled 2016-09-23 (×3): qty 100

## 2016-09-23 MED ORDER — TEMAZEPAM 15 MG PO CAPS
15.0000 mg | ORAL_CAPSULE | Freq: Every evening | ORAL | Status: DC | PRN
  Filled 2016-09-23: qty 1

## 2016-09-23 MED ORDER — METOCLOPRAMIDE HCL 5 MG/ML IJ SOLN: 5.0000 mg | Freq: Three times a day (TID) | INTRAMUSCULAR | Status: DC | PRN

## 2016-09-23 MED ORDER — ARTIFICIAL TEARS OPHTHALMIC OINT
TOPICAL_OINTMENT | OPHTHALMIC | Status: AC
  Filled 2016-09-23: qty 3.5

## 2016-09-23 MED ORDER — EZETIMIBE 10 MG PO TABS
10.0000 mg | ORAL_TABLET | Freq: Every day | ORAL | Status: DC
Start: 2016-09-24 — End: 2016-09-25
  Administered 2016-09-24: 10 mg via ORAL
  Filled 2016-09-23 (×2): qty 1

## 2016-09-23 MED ORDER — ROCURONIUM BROMIDE 50 MG/5ML IV SOLN
INTRAVENOUS | Status: AC
  Filled 2016-09-23: qty 1

## 2016-09-23 MED ORDER — MIDAZOLAM HCL 2 MG/2ML IJ SOLN
2.0000 mg | Freq: Once | INTRAMUSCULAR | Status: AC
  Administered 2016-09-23: 2 mg via INTRAVENOUS

## 2016-09-23 MED ORDER — HYDROXYZINE HCL 10 MG PO TABS
10.0000 mg | ORAL_TABLET | Freq: Three times a day (TID) | ORAL | Status: DC | PRN
  Filled 2016-09-23: qty 1

## 2016-09-23 MED ORDER — FENTANYL CITRATE (PF) 250 MCG/5ML IJ SOLN
INTRAMUSCULAR | Status: AC
  Filled 2016-09-23: qty 5

## 2016-09-23 MED ORDER — ACETAMINOPHEN 325 MG PO TABS
650.0000 mg | ORAL_TABLET | Freq: Four times a day (QID) | ORAL | Status: DC | PRN
  Administered 2016-09-24 – 2016-09-25 (×3): 650 mg via ORAL
  Filled 2016-09-23 (×3): qty 2

## 2016-09-23 MED ORDER — ONDANSETRON HCL 4 MG PO TABS: 4.0000 mg | ORAL_TABLET | Freq: Four times a day (QID) | ORAL | Status: DC | PRN

## 2016-09-23 MED ORDER — VITAMIN B-6 100 MG PO TABS
100.0000 mg | ORAL_TABLET | Freq: Every day | ORAL | Status: DC
  Administered 2016-09-24: 100 mg via ORAL
  Filled 2016-09-23 (×2): qty 1

## 2016-09-23 MED ORDER — BIOTIN 5000 MCG PO CAPS: 5000.0000 ug | ORAL_CAPSULE | Freq: Every day | ORAL | Status: DC

## 2016-09-23 MED ORDER — LOSARTAN POTASSIUM 50 MG PO TABS
100.0000 mg | ORAL_TABLET | Freq: Every day | ORAL | Status: DC
  Administered 2016-09-24: 100 mg via ORAL
  Filled 2016-09-23 (×2): qty 2

## 2016-09-23 MED ORDER — SODIUM CHLORIDE 0.9 % IV SOLN
INTRAVENOUS | Status: DC
  Administered 2016-09-23: 20:00:00 via INTRAVENOUS

## 2016-09-23 MED ORDER — ONDANSETRON HCL 4 MG/2ML IJ SOLN: 4.0000 mg | Freq: Once | INTRAMUSCULAR | Status: DC | PRN

## 2016-09-23 MED ORDER — LIDOCAINE HCL (CARDIAC) 20 MG/ML IV SOLN
INTRAVENOUS | Status: AC
  Filled 2016-09-23: qty 5

## 2016-09-23 MED ORDER — PHENOL 1.4 % MT LIQD: 1.0000 | OROMUCOSAL | Status: DC | PRN

## 2016-09-23 MED ORDER — TRAMADOL HCL 50 MG PO TABS: 50.0000 mg | ORAL_TABLET | Freq: Four times a day (QID) | ORAL | 0 refills | Status: DC | PRN

## 2016-09-23 MED ORDER — MEPERIDINE HCL 25 MG/ML IJ SOLN: 12.5000 mg | INTRAMUSCULAR | Status: DC | PRN

## 2016-09-23 MED ORDER — LOSARTAN POTASSIUM-HCTZ 100-25 MG PO TABS: 1.0000 | ORAL_TABLET | Freq: Every day | ORAL | Status: DC

## 2016-09-23 MED ORDER — MENTHOL 3 MG MT LOZG: 1.0000 | LOZENGE | OROMUCOSAL | Status: DC | PRN

## 2016-09-23 MED ORDER — TRAMADOL HCL 50 MG PO TABS
50.0000 mg | ORAL_TABLET | Freq: Four times a day (QID) | ORAL | Status: DC | PRN
  Administered 2016-09-24: 100 mg via ORAL
  Filled 2016-09-23: qty 2

## 2016-09-23 MED ORDER — CYANOCOBALAMIN 500 MCG PO TABS
2500.0000 ug | ORAL_TABLET | Freq: Every day | ORAL | Status: DC
  Administered 2016-09-24: 2500 ug via ORAL
  Filled 2016-09-23 (×2): qty 5

## 2016-09-23 MED ORDER — CHLORHEXIDINE GLUCONATE 4 % EX LIQD: 60.0000 mL | Freq: Once | CUTANEOUS | Status: DC

## 2016-09-23 MED ORDER — LIDOCAINE HCL (CARDIAC) 20 MG/ML IV SOLN
INTRAVENOUS | Status: DC | PRN
  Administered 2016-09-23: 100 mg via INTRAVENOUS

## 2016-09-23 MED ORDER — SODIUM CHLORIDE 0.9 % IR SOLN
Status: DC | PRN
  Administered 2016-09-23: 1000 mL

## 2016-09-23 MED ORDER — LACTATED RINGERS IV SOLN
INTRAVENOUS | Status: DC
Start: 2016-09-23 — End: 2016-09-23
  Administered 2016-09-23 (×2): via INTRAVENOUS

## 2016-09-23 MED ORDER — METOPROLOL SUCCINATE ER 25 MG PO TB24
25.0000 mg | ORAL_TABLET | Freq: Every day | ORAL | Status: DC
  Administered 2016-09-24: 25 mg via ORAL
  Filled 2016-09-23 (×2): qty 1

## 2016-09-23 MED ORDER — PROMETHAZINE HCL 25 MG/ML IJ SOLN: 6.2500 mg | INTRAMUSCULAR | Status: DC | PRN

## 2016-09-23 MED ORDER — PANTOPRAZOLE SODIUM 20 MG PO TBEC
20.0000 mg | DELAYED_RELEASE_TABLET | Freq: Every day | ORAL | Status: DC
  Administered 2016-09-24: 20 mg via ORAL
  Filled 2016-09-23 (×2): qty 1

## 2016-09-23 MED ORDER — CERAVE EX LOTN: 1.0000 "application " | TOPICAL_LOTION | Freq: Three times a day (TID) | CUTANEOUS | Status: DC | PRN

## 2016-09-23 MED ORDER — ROCURONIUM BROMIDE 100 MG/10ML IV SOLN
INTRAVENOUS | Status: DC | PRN
  Administered 2016-09-23: 40 mg via INTRAVENOUS

## 2016-09-23 MED ORDER — PHENYLEPHRINE HCL 10 MG/ML IJ SOLN
INTRAVENOUS | Status: DC | PRN
  Administered 2016-09-23: 25 ug/min via INTRAVENOUS

## 2016-09-23 MED ORDER — ACETAMINOPHEN 650 MG RE SUPP: 650.0000 mg | Freq: Four times a day (QID) | RECTAL | Status: DC | PRN

## 2016-09-23 MED ORDER — PROPOFOL 10 MG/ML IV BOLUS
INTRAVENOUS | Status: DC | PRN
  Administered 2016-09-23: 140 mg via INTRAVENOUS

## 2016-09-23 MED ORDER — BUPIVACAINE-EPINEPHRINE 0.25% -1:200000 IJ SOLN
INTRAMUSCULAR | Status: DC | PRN
  Administered 2016-09-23: 10 mL

## 2016-09-23 MED ORDER — POTASSIUM CHLORIDE CRYS ER 10 MEQ PO TBCR
10.0000 meq | EXTENDED_RELEASE_TABLET | Freq: Every day | ORAL | Status: DC
  Administered 2016-09-24: 10 meq via ORAL
  Filled 2016-09-23 (×2): qty 1

## 2016-09-23 MED ORDER — HYDROCHLOROTHIAZIDE 25 MG PO TABS
25.0000 mg | ORAL_TABLET | Freq: Every day | ORAL | Status: DC
  Administered 2016-09-24: 25 mg via ORAL
  Filled 2016-09-23 (×2): qty 1

## 2016-09-23 MED ORDER — HYDROMORPHONE HCL 1 MG/ML IJ SOLN
0.2500 mg | INTRAMUSCULAR | Status: DC | PRN
Start: 2016-09-23 — End: 2016-09-23

## 2016-09-23 MED ORDER — FENTANYL CITRATE (PF) 100 MCG/2ML IJ SOLN
50.0000 ug | Freq: Once | INTRAMUSCULAR | Status: AC
  Administered 2016-09-23: 50 ug via INTRAVENOUS

## 2016-09-23 MED ORDER — POLYETHYLENE GLYCOL 3350 17 G PO PACK: 17.0000 g | PACK | Freq: Every day | ORAL | Status: DC | PRN

## 2016-09-23 MED ORDER — METOCLOPRAMIDE HCL 5 MG PO TABS: 5.0000 mg | ORAL_TABLET | Freq: Three times a day (TID) | ORAL | Status: DC | PRN

## 2016-09-23 MED ORDER — PROPOFOL 10 MG/ML IV BOLUS
INTRAVENOUS | Status: AC
  Filled 2016-09-23: qty 20

## 2016-09-23 MED ORDER — CEFAZOLIN SODIUM-DEXTROSE 2-4 GM/100ML-% IV SOLN
2.0000 g | INTRAVENOUS | Status: AC
  Administered 2016-09-23: 2 g via INTRAVENOUS
  Filled 2016-09-23: qty 100

## 2016-09-23 MED ORDER — HYDROMORPHONE HCL 1 MG/ML IJ SOLN: 0.5000 mg | INTRAMUSCULAR | Status: DC | PRN

## 2016-09-23 MED ORDER — ONDANSETRON HCL 4 MG/2ML IJ SOLN
INTRAMUSCULAR | Status: DC | PRN
  Administered 2016-09-23: 4 mg via INTRAVENOUS

## 2016-09-23 MED ORDER — FENTANYL CITRATE (PF) 100 MCG/2ML IJ SOLN
INTRAMUSCULAR | Status: AC
  Administered 2016-09-23: 50 ug via INTRAVENOUS
  Filled 2016-09-23: qty 2

## 2016-09-23 MED ORDER — DOCUSATE SODIUM 100 MG PO CAPS
100.0000 mg | ORAL_CAPSULE | Freq: Two times a day (BID) | ORAL | Status: DC
  Administered 2016-09-23 – 2016-09-24 (×3): 100 mg via ORAL
  Filled 2016-09-23 (×3): qty 1

## 2016-09-23 MED ORDER — SUGAMMADEX SODIUM 200 MG/2ML IV SOLN
INTRAVENOUS | Status: DC | PRN
  Administered 2016-09-23: 150 mg via INTRAVENOUS

## 2016-09-23 MED ORDER — MIDAZOLAM HCL 2 MG/2ML IJ SOLN
INTRAMUSCULAR | Status: AC
  Administered 2016-09-23: 2 mg via INTRAVENOUS
  Filled 2016-09-23: qty 2

## 2016-09-23 MED ORDER — ONDANSETRON HCL 4 MG/2ML IJ SOLN: 4.0000 mg | Freq: Four times a day (QID) | INTRAMUSCULAR | Status: DC | PRN

## 2016-09-23 SURGICAL SUPPLY — 72 items
BIT DRILL 170X2.5X (BIT) IMPLANT
BIT DRILL 5/64X5 DISP (BIT) ×2 IMPLANT
BIT DRL 170X2.5X (BIT)
BLADE SAG 18X100X1.27 (BLADE) ×2 IMPLANT
CAPT SHLDR REVTOTAL 1 ×1 IMPLANT
CEMENT HV SMART SET (Cement) ×2 IMPLANT
CLSR STERI-STRIP ANTIMIC 1/2X4 (GAUZE/BANDAGES/DRESSINGS) ×1 IMPLANT
COVER SURGICAL LIGHT HANDLE (MISCELLANEOUS) ×2 IMPLANT
DRAPE IMP U-DRAPE 54X76 (DRAPES) ×4 IMPLANT
DRAPE INCISE IOBAN 66X45 STRL (DRAPES) ×2 IMPLANT
DRAPE ORTHO SPLIT 77X108 STRL (DRAPES) ×4
DRAPE SURG ORHT 6 SPLT 77X108 (DRAPES) ×2 IMPLANT
DRAPE U-SHAPE 47X51 STRL (DRAPES) ×2 IMPLANT
DRILL 2.5 (BIT)
DRSG ADAPTIC 3X8 NADH LF (GAUZE/BANDAGES/DRESSINGS) ×2 IMPLANT
DRSG PAD ABDOMINAL 8X10 ST (GAUZE/BANDAGES/DRESSINGS) ×2 IMPLANT
DURAPREP 26ML APPLICATOR (WOUND CARE) ×2 IMPLANT
ELECT BLADE 4.0 EZ CLEAN MEGAD (MISCELLANEOUS) ×2
ELECT NDL TIP 2.8 STRL (NEEDLE) ×1 IMPLANT
ELECT NEEDLE TIP 2.8 STRL (NEEDLE) ×2 IMPLANT
ELECT REM PT RETURN 9FT ADLT (ELECTROSURGICAL) ×2
ELECTRODE BLDE 4.0 EZ CLN MEGD (MISCELLANEOUS) ×1 IMPLANT
ELECTRODE REM PT RTRN 9FT ADLT (ELECTROSURGICAL) ×1 IMPLANT
GAUZE SPONGE 4X4 12PLY STRL (GAUZE/BANDAGES/DRESSINGS) ×2 IMPLANT
GLOVE BIOGEL PI ORTHO PRO 7.5 (GLOVE) ×1
GLOVE BIOGEL PI ORTHO PRO SZ8 (GLOVE) ×1
GLOVE ORTHO TXT STRL SZ7.5 (GLOVE) ×2 IMPLANT
GLOVE PI ORTHO PRO STRL 7.5 (GLOVE) ×1 IMPLANT
GLOVE PI ORTHO PRO STRL SZ8 (GLOVE) ×1 IMPLANT
GLOVE SURG ORTHO 8.5 STRL (GLOVE) ×2 IMPLANT
GOWN STRL REUS W/ TWL LRG LVL3 (GOWN DISPOSABLE) ×1 IMPLANT
GOWN STRL REUS W/ TWL XL LVL3 (GOWN DISPOSABLE) ×2 IMPLANT
GOWN STRL REUS W/TWL LRG LVL3 (GOWN DISPOSABLE) ×2
GOWN STRL REUS W/TWL XL LVL3 (GOWN DISPOSABLE) ×4
KIT BASIN OR (CUSTOM PROCEDURE TRAY) ×2 IMPLANT
KIT ROOM TURNOVER OR (KITS) ×2 IMPLANT
MANIFOLD NEPTUNE II (INSTRUMENTS) ×2 IMPLANT
NDL 1/2 CIR MAYO (NEEDLE) ×1 IMPLANT
NDL HYPO 25GX1X1/2 BEV (NEEDLE) ×1 IMPLANT
NEEDLE 1/2 CIR MAYO (NEEDLE) ×2 IMPLANT
NEEDLE HYPO 25GX1X1/2 BEV (NEEDLE) ×2 IMPLANT
NS IRRIG 1000ML POUR BTL (IV SOLUTION) ×2 IMPLANT
PACK SHOULDER (CUSTOM PROCEDURE TRAY) ×2 IMPLANT
PAD ARMBOARD 7.5X6 YLW CONV (MISCELLANEOUS) ×4 IMPLANT
PIN GUIDE 1.2 (PIN) IMPLANT
PIN GUIDE GLENOPHERE 1.5MX300M (PIN) IMPLANT
PIN METAGLENE 2.5 (PIN) IMPLANT
SLING ARM FOAM STRAP LRG (SOFTGOODS) ×1 IMPLANT
SLING ARM FOAM STRAP MED (SOFTGOODS) IMPLANT
SPONGE LAP 18X18 X RAY DECT (DISPOSABLE) IMPLANT
SPONGE LAP 4X18 X RAY DECT (DISPOSABLE) ×2 IMPLANT
STRIP CLOSURE SKIN 1/2X4 (GAUZE/BANDAGES/DRESSINGS) ×2 IMPLANT
SUCTION FRAZIER HANDLE 10FR (MISCELLANEOUS) ×1
SUCTION TUBE FRAZIER 10FR DISP (MISCELLANEOUS) ×1 IMPLANT
SUT FIBERWIRE #2 38 REV NDL BL (SUTURE) ×6
SUT FIBERWIRE #2 38 T-5 BLUE (SUTURE) ×4
SUT MNCRL AB 4-0 PS2 18 (SUTURE) ×2 IMPLANT
SUT VIC AB 0 CT1 27 (SUTURE) ×2
SUT VIC AB 0 CT1 27XBRD ANBCTR (SUTURE) IMPLANT
SUT VIC AB 0 CT2 27 (SUTURE) ×2 IMPLANT
SUT VIC AB 2-0 CT1 27 (SUTURE) ×2
SUT VIC AB 2-0 CT1 TAPERPNT 27 (SUTURE) ×1 IMPLANT
SUT VICRYL 0 CT 1 36IN (SUTURE) ×2 IMPLANT
SUTURE FIBERWR #2 38 T-5 BLUE (SUTURE) ×2 IMPLANT
SUTURE FIBERWR#2 38 REV NDL BL (SUTURE) IMPLANT
SYR CONTROL 10ML LL (SYRINGE) ×2 IMPLANT
TAPE CLOTH SURG 4X10 WHT LF (GAUZE/BANDAGES/DRESSINGS) ×1 IMPLANT
TOWEL OR 17X24 6PK STRL BLUE (TOWEL DISPOSABLE) ×2 IMPLANT
TOWEL OR 17X26 10 PK STRL BLUE (TOWEL DISPOSABLE) ×2 IMPLANT
TOWER CARTRIDGE SMART MIX (DISPOSABLE) IMPLANT
WATER STERILE IRR 1000ML POUR (IV SOLUTION) ×2 IMPLANT
YANKAUER SUCT BULB TIP NO VENT (SUCTIONS) ×2 IMPLANT

## 2016-09-23 NOTE — Brief Op Note (Signed)
09/23/2016  5:48 PM  PATIENT:  [name]  83 y.o. female  PRE-OPERATIVE DIAGNOSIS:  Displaced proximal humerus fracture, right shoulder   POST-OPERATIVE DIAGNOSIS:  Displaced proximal humerus fracture, right shoulder  PROCEDURE:  Procedure(s) with comments: REVERSE SHOULDER ARTHROPLASTY (Right) - 2 hrs DePuy Delta Xtend  SURGEON:  Surgeon(s) and Role:    Beverely Low* Nate Common, MD - Primary  PHYSICIAN ASSISTANT:   ASSISTANTS: Thea Gisthomas B Dixon, PA-C   ANESTHESIA:   regional and general  EBL:  Total I/O In: 1500 [I.V.:1500] Out: 150 [Blood:150]  BLOOD ADMINISTERED:none  DRAINS: none   LOCAL MEDICATIONS USED:  MARCAINE     SPECIMEN:  No Specimen  DISPOSITION OF SPECIMEN:  N/A  COUNTS:  YES  TOURNIQUET:  * No tourniquets in log *  DICTATION: .Other Dictation: Dictation Number [id number]  PLAN OF CARE: Admit to inpatient   PATIENT DISPOSITION:  PACU - hemodynamically stable.   Delay start of Pharmacological VTE agent (>24hrs) due to surgical blood loss or risk of bleeding: not applicable

## 2016-09-23 NOTE — Transfer of Care (Signed)
Immediate Anesthesia Transfer of Care Note  Patient: [name]  Procedure(s) Performed: Procedure(s) with comments: REVERSE SHOULDER ARTHROPLASTY (Right) - 2 hrs  Patient Location: PACU  Anesthesia Type:GA combined with regional for post-op pain  Level of Consciousness: awake, alert  and oriented  Airway & Oxygen Therapy: Patient Spontanous Breathing and Patient connected to nasal cannula oxygen  Post-op Assessment: Report given to RN and Post -op Vital signs reviewed and stable  Post vital signs: Reviewed and stable  Last Vitals:  Vitals:   09/23/16 1431 09/23/16 1434  BP:    Pulse: 92 97  Resp: 15 (!) 22  Temp:      Last Pain:  Vitals:   09/23/16 1225  TempSrc: Oral      Patients Stated Pain Goal: 7 (09/23/16 1230)  Complications: No apparent anesthesia complications

## 2016-09-23 NOTE — Discharge Instructions (Signed)
Ice to the shoulder as much as possible.  Rest the shoulder across your waist while seated or reclined.  You will need a pillow propped behind the elbow to keep it from sliding back.   Keep the incision covered and clean and dry for one week, then ok to get it wet in the shower.   Do not reach behind you or away from your body.  No pushing up with the right arm!  No pulling or lifting.    Please do gentle exercises 4 times per day.  Lap slides, hand on lap slide it to the knee and back to the hip. Gentle pendulums, supported if necessary,  Hand to face ok.  Follow up with Dr Ranell PatrickNorris in two weeks in the office [phone]

## 2016-09-23 NOTE — Anesthesia Procedure Notes (Signed)
Procedure Name: Intubation Date/Time: 09/23/2016 3:22 PM Performed by: Rush Farmer E Pre-anesthesia Checklist: Patient identified, Emergency Drugs available, Suction available and Patient being monitored Patient Re-evaluated:Patient Re-evaluated prior to induction Oxygen Delivery Method: Circle system utilized Preoxygenation: Pre-oxygenation with 100% oxygen Induction Type: IV induction Ventilation: Mask ventilation without difficulty Laryngoscope Size: Mac and 3 Grade View: Grade I Tube type: Oral Tube size: 7.0 mm Number of attempts: 1 Airway Equipment and Method: Stylet Placement Confirmation: ETT inserted through vocal cords under direct vision,  positive ETCO2 and breath sounds checked- equal and bilateral Secured at: 21 cm Tube secured with: Tape Dental Injury: Teeth and Oropharynx as per pre-operative assessment

## 2016-09-23 NOTE — Anesthesia Procedure Notes (Signed)
Anesthesia Regional Block: Interscalene brachial plexus block   Pre-Anesthetic Checklist: ,, timeout performed, Correct Patient, Correct Site, Correct Laterality, Correct Procedure, Correct Position, site marked, Risks and benefits discussed,  Surgical consent,  Pre-op evaluation,  At surgeon's request and post-op pain management  Laterality: Right  Prep: chloraprep       Needles:  Injection technique: Single-shot  Needle Type: Echogenic Stimulator Needle     Needle Length: 5cm  Needle Gauge: 21     Additional Needles:   Procedures: ultrasound guided, nerve stimulator,,,,,,   Nerve Stimulator or Paresthesia:  Response: 0.4 mA,   Additional Responses:   Narrative:  Start time: 09/23/2016 2:10 PM End time: 09/23/2016 2:20 PM Injection made incrementally with aspirations every 5 mL.  Performed by: Personally  Anesthesiologist: Arta BruceSSEY, Jericca Russett  Additional Notes: Monitors applied. Patient sedated. Sterile prep and drape,hand hygiene and sterile gloves were used. Relevant anatomy identified.Needle position confirmed.Local anesthetic injected incrementally after negative aspiration. Local anesthetic spread visualized around nerve(s). Vascular puncture avoided. No complications. Image printed for medical record.The patient tolerated the procedure well.

## 2016-09-23 NOTE — Interval H&P Note (Signed)
History and Physical Interval Note:  09/23/2016 2:58 PM  [name]  has presented today for surgery, with the diagnosis of Right Reverse Total shoulder  The various methods of treatment have been discussed with the patient and family. After consideration of risks, benefits and other options for treatment, the patient has consented to  Procedure(s) with comments: REVERSE SHOULDER ARTHROPLASTY (Right) - 2 hrs as a surgical intervention .  The patient's history has been reviewed, patient examined, no change in status, stable for surgery.  I have reviewed the patient's chart and labs.  Questions were answered to the patient's satisfaction.     Kirandeep Fariss,STEVEN R

## 2016-09-23 NOTE — Anesthesia Postprocedure Evaluation (Signed)
Anesthesia Post Note  Patient: [name]  Procedure(s) Performed: Procedure(s) (LRB): REVERSE SHOULDER ARTHROPLASTY (Right)     Patient location during evaluation: PACU Anesthesia Type: General Level of consciousness: awake, awake and alert and oriented Vital Signs Assessment: post-procedure vital signs reviewed and stable Respiratory status: spontaneous breathing, nonlabored ventilation and respiratory function stable Cardiovascular status: blood pressure returned to baseline Anesthetic complications: no    Last Vitals:  Vitals:   09/23/16 1910 09/23/16 1915  BP:    Pulse: 88 86  Resp: 20 20  Temp:  36.6 C    Last Pain:  Vitals:   09/23/16 1915  TempSrc:   PainSc: 0-No pain                 Ada Woodbury COKER

## 2016-09-23 NOTE — Anesthesia Preprocedure Evaluation (Addendum)
Anesthesia Evaluation  Patient identified by MRN, date of birth, ID band Patient awake    Reviewed: Allergy & Precautions, NPO status , Patient's Chart, lab work & pertinent test results  Airway Mallampati: I  TM Distance: >3 FB Neck ROM: Full    Dental  (+) Teeth Intact   Pulmonary former smoker,    Pulmonary exam normal        Cardiovascular hypertension, Pt. on medications Normal cardiovascular exam     Neuro/Psych    GI/Hepatic GERD  Controlled and Medicated,  Endo/Other    Renal/GU      Musculoskeletal   Abdominal   Peds  Hematology   Anesthesia Other Findings   Reproductive/Obstetrics                            Anesthesia Physical Anesthesia Plan  ASA: II  Anesthesia Plan: General   Post-op Pain Management:  Regional for Post-op pain   Induction: Intravenous  PONV Risk Score and Plan: 3 and Ondansetron, Propofol, Midazolam and Treatment may vary due to age or medical condition  Airway Management Planned: Oral ETT  Additional Equipment:   Intra-op Plan:   Post-operative Plan: Extubation in OR  Informed Consent: I have reviewed the patients History and Physical, chart, labs and discussed the procedure including the risks, benefits and alternatives for the proposed anesthesia with the patient or authorized representative who has indicated his/her understanding and acceptance.     Plan Discussed with: CRNA and Surgeon  Anesthesia Plan Comments:         Anesthesia Quick Evaluation

## 2016-09-24 LAB — BASIC METABOLIC PANEL
Anion gap: 8 (ref 5–15)
BUN: 8 mg/dL (ref 6–20)
CALCIUM: 8.4 mg/dL — AB (ref 8.9–10.3)
CO2: 28 mmol/L (ref 22–32)
Chloride: 100 mmol/L — ABNORMAL LOW (ref 101–111)
Creatinine, Ser: 0.87 mg/dL (ref 0.44–1.00)
GFR calc Af Amer: >60 mL/min (ref >60)
GLUCOSE: 140 mg/dL — AB (ref 65–99)
Potassium: 3.5 mmol/L (ref 3.5–5.1)
Sodium: 136 mmol/L (ref 135–145)

## 2016-09-24 LAB — HEMOGLOBIN AND HEMATOCRIT, BLOOD
HEMATOCRIT: 34.1 % — AB (ref 36.0–46.0)
HEMOGLOBIN: 10.7 g/dL — AB (ref 12.0–15.0)

## 2016-09-24 NOTE — Progress Notes (Addendum)
   Subjective: 1 Day Post-Op Procedure(s) (LRB): REVERSE SHOULDER ARTHROPLASTY (Right) Patient reports pain as mild.  Block still working now Patient seen in rounds for Dr. Ranell PatrickNorris. Patient is well, and has had no acute complaints or problems Patient is ready to go home today if does well with therapy.  Objective: Vital signs in last 24 hours: Temp:  [97.8 F (36.6 C)-100.2 F (37.9 C)] 99.3 F (37.4 C) (07/14 0500) Pulse Rate:  [77-100] 100 (07/14 0500) Resp:  [14-22] 15 (07/14 0500) BP: (129-180)/(56-88) 129/60 (07/14 0500) SpO2:  [92 %-100 %] 94 % (07/14 0500) Weight:  [72.1 kg (159 lb)] 72.1 kg (159 lb) (07/13 1225)  Intake/Output from previous day:  Intake/Output Summary (Last 24 hours) at 09/24/16 0838 Last data filed at 09/23/16 1923  Gross per 24 hour  Intake             1800 ml  Output              450 ml  Net             1350 ml    Intake/Output this shift: No intake/output data recorded.  Labs:  Recent Labs  09/23/16 1150 09/24/16 0530  HGB 12.9 10.7*    Recent Labs  09/23/16 1150 09/24/16 0530  WBC 10.4  --   RBC 4.32  --   HCT 40.2 34.1*  PLT 212  --     Recent Labs  09/23/16 1150 09/24/16 0530  NA 140 136  K 3.8 3.5  CL 102 100*  CO2 31 28  BUN 10 8  CREATININE 0.85 0.87  GLUCOSE 123* 140*  CALCIUM 9.3 8.4*   No results for input(s): LABPT, INR in the last 72 hours.  EXAM: General - Patient is Alert, Appropriate and Oriented Extremity - Neurovascular intact Sensation intact distally Intact pulses distally Dressing - clean, dry Motor Function - intact, moving hand and fingers well on exam.   Assessment/Plan: 1 Day Post-Op Procedure(s) (LRB): REVERSE SHOULDER ARTHROPLASTY (Right) Procedure(s) (LRB): REVERSE SHOULDER ARTHROPLASTY (Right) Past Medical History:  Diagnosis Date  . Fibrocystic breast disease   . GERD (gastroesophageal reflux disease)   . H/O: hysterectomy   . Humerus fracture    right proximal  .  Hypercholesterolemia   . Hypertension 1984   renal artery   Active Problems:   S/P shoulder replacement, right  Estimated body mass index is 29.08 kg/m as calculated from the following:   Height as of this encounter: 5\' 2"  (1.575 m).   Weight as of this encounter: 72.1 kg (159 lb). Up with therapy Discharge home  Diet - Cardiac diet Follow up - in 2 weeks Activity - up ad lib Disposition - Home Condition Upon Discharge - Stable D/C Meds - See DC Summary  Avel Peacerew [place] Capers, PA-C Orthopaedic Surgery 09/24/2016, 8:38 AM

## 2016-09-24 NOTE — Discharge Summary (Signed)
Physician Discharge Summary   Patient ID: [name] MRN: [id number] DOB/AGE: September 22, 1933 83 y.o.  Admit date: 09/23/2016 Discharge date: 09/24/2016  Primary Diagnosis:  Displaced proximal humerus fracture, right shoulder   Admission Diagnoses:  Past Medical History:  Diagnosis Date  . Fibrocystic breast disease   . GERD (gastroesophageal reflux disease)   . H/O: hysterectomy   . Humerus fracture    right proximal  . Hypercholesterolemia   . 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   Weight as of this encounter: 72.1 kg (159 lb).  Procedure:  Procedure(s) (LRB): REVERSE SHOULDER ARTHROPLASTY (Right)   Consults: None  HPI: Pt is a 83 y.o. female complaining of right shoulder pain after a fall earlier today. Pain had continually increased since the beginning. X-rays in the clinic show proximal humerus fracture with complete displacement. . Various options are discussed with the patient. Risks, benefits and expectations were discussed with the patient. Patient understand the risks, benefits and expectations and wishes to proceed with surgery.   Laboratory Data: Admission on 09/23/2016  Component Date Value Ref Range Status  . WBC 09/23/2016 10.4  4.0 - 10.5 K/uL Final  . RBC 09/23/2016 4.32  3.87 - 5.11 MIL/uL Final  . Hemoglobin 09/23/2016 12.9  12.0 - 15.0 g/dL Final  . HCT 09/23/2016 40.2  36.0 - 46.0 % Final  . MCV 09/23/2016 93.1  78.0 - 100.0 fL Final  . MCH 09/23/2016 29.9  26.0 - 34.0 pg Final  . MCHC 09/23/2016 32.1  30.0 - 36.0 g/dL Final  . RDW 09/23/2016 13.5  11.5 - 15.5 % Final  . Platelets 09/23/2016 212  150 - 400 K/uL Final  . Sodium 09/23/2016 140  135 - 145 mmol/L Final  . Potassium 09/23/2016 3.8  3.5 - 5.1 mmol/L Final  . Chloride 09/23/2016 102  101 - 111  mmol/L Final  . CO2 09/23/2016 31  22 - 32 mmol/L Final  . Glucose, Bld 09/23/2016 123* 65 - 99 mg/dL Final  . BUN 09/23/2016 10  6 - 20 mg/dL Final  . Creatinine, Ser 09/23/2016 0.85  0.44 - 1.00 mg/dL Final  . Calcium 09/23/2016 9.3  8.9 - 10.3 mg/dL Final  . GFR calc non Af Amer 09/23/2016 >60  >60 mL/min Final  . GFR calc Af Amer 09/23/2016 >60  >60 mL/min Final   Comment: (NOTE) The eGFR has been calculated using the CKD EPI equation. This calculation has not been validated in all clinical situations. eGFR's persistently <60 mL/min signify possible Chronic Kidney Disease.   . Anion gap 09/23/2016 7  5 - 15 Final  . Hemoglobin 09/24/2016 10.7* 12.0 - 15.0 g/dL Final  . HCT 09/24/2016 34.1* 36.0 - 46.0 % Final  . Sodium 09/24/2016 136  135 - 145 mmol/L Final  . Potassium 09/24/2016 3.5  3.5 - 5.1 mmol/L Final  . Chloride 09/24/2016 100* 101 - 111 mmol/L Final  . CO2 09/24/2016 28  22 - 32 mmol/L Final  . Glucose, Bld 09/24/2016 140* 65 - 99 mg/dL Final  . BUN 09/24/2016 8  6 - 20 mg/dL Final  . Creatinine, Ser 09/24/2016 0.87  0.44 - 1.00 mg/dL Final  . Calcium 09/24/2016 8.4* 8.9 - 10.3 mg/dL Final  . GFR calc non Af Amer 09/24/2016 >60  >  60 mL/min Final  . GFR calc Af Amer 09/24/2016 >60  >60 mL/min Final   Comment: (NOTE) The eGFR has been calculated using the CKD EPI equation. This calculation has not been validated in all clinical situations. eGFR's persistently <60 mL/min signify possible Chronic Kidney Disease.   [name] gap 09/24/2016 8  5 - 15 Final  Hospital Outpatient Visit on 08/17/2016  Component Date Value Ref Range Status  . WBC 08/17/2016 5.8  4.0 - 10.5 K/uL Final  . RBC 08/17/2016 4.42  3.87 - 5.11 MIL/uL Final  . Hemoglobin 08/17/2016 13.4  12.0 - 15.0 g/dL Final  . HCT 08/17/2016 41.7  36.0 - 46.0 % Final  . MCV 08/17/2016 94.3  78.0 - 100.0 fL Final  . MCH 08/17/2016 30.3  26.0 - 34.0 pg Final  . MCHC 08/17/2016 32.1  30.0 - 36.0 g/dL Final  . RDW  08/17/2016 13.4  11.5 - 15.5 % Final  . Platelets 08/17/2016 209  150 - 400 K/uL Final  . Sodium 08/17/2016 139  135 - 145 mmol/L Final  . Potassium 08/17/2016 3.6  3.5 - 5.1 mmol/L Final  . Chloride 08/17/2016 102  101 - 111 mmol/L Final  . CO2 08/17/2016 31  22 - 32 mmol/L Final  . Glucose, Bld 08/17/2016 109* 65 - 99 mg/dL Final  . BUN 08/17/2016 17  6 - 20 mg/dL Final  . Creatinine, Ser 08/17/2016 0.71  0.44 - 1.00 mg/dL Final  . Calcium 08/17/2016 9.0  8.9 - 10.3 mg/dL Final  . GFR calc non Af Amer 08/17/2016 >60  >60 mL/min Final  . GFR calc Af Amer 08/17/2016 >60  >60 mL/min Final   Comment: (NOTE) The eGFR has been calculated using the CKD EPI equation. This calculation has not been validated in all clinical situations. eGFR's persistently <60 mL/min signify possible Chronic Kidney Disease.   . Anion gap 08/17/2016 6  5 - 15 Final     X-Rays:Dg Shoulder Right Port  Result Date: 09/23/2016 CLINICAL DATA:  Status post right shoulder replacement. EXAM: PORTABLE RIGHT SHOULDER COMPARISON:  None. FINDINGS: Single AP view demonstrates reverse shoulder arthroplasty in expected alignment. There are no periprosthetic lucencies. Recent postsurgical change includes air in the joint and soft tissues. IMPRESSION: Right shoulder arthroplasty in expected alignment. No immediate postoperative complication. Electronically Signed   By: Jeb Levering M.D.   On: 09/23/2016 19:06    EKG: Orders placed or performed during the hospital encounter of 08/17/16  . EKG 12-Lead  . EKG 12-Lead     Hospital Course: [name] is a 83 y.o. who was admitted to [facility]. They were brought to the operating room on 09/23/2016 and underwent Procedure(s): Gas.  Patient tolerated the procedure well and was later transferred to the recovery room and then to the orthopaedic floor for postoperative care.  They were given PO and IV analgesics for pain control following their surgery.   They were given 24 hours of postoperative antibiotics of  Anti-infectives    Start     Dose/Rate Route Frequency Ordered Stop   09/23/16 2200  ceFAZolin (ANCEF) IVPB 2g/100 mL premix     2 g 200 mL/hr over 30 Minutes Intravenous Every 6 hours 09/23/16 1952 09/24/16 1559   09/23/16 1145  ceFAZolin (ANCEF) IVPB 2g/100 mL premix     2 g 200 mL/hr over 30 Minutes Intravenous On call to O.R. 09/23/16 1141 09/23/16 1523     PT and OT were ordered  for postop therapy protocol.  Discharge planning consulted to help with postop disposition and equipment needs.  Patient had a good night on the evening of surgery.  They started to get up OOB with therapy on day one. Patient was seen in rounds on day one by the weekend coverage saff and it was felt that as long as they did well with the remaining sessions of therapy that they would be ready to go home.  Arrangements were made and they were setup to go home on POD 1.  Diet - Cardiac diet Follow up - in 2 weeks Activity - up ad lib, exercises as per therapy Dressing - May remove the surgical dressing tomorrow at home and then apply a dry gauze dressing daily. Disposition - Home Condition Upon Discharge - Stable D/C Meds - See DC Summary   Allergies as of 09/24/2016      Reactions   Baycol [cerivastatin Sodium] Itching   Crestor [rosuvastatin Calcium] Itching   Lipitor [atorvastatin Calcium] Itching   Morphine And Related Other (See Comments)   Hallucinations , skin crawling, "feels like elephants are crawling"   Pravachol Other (See Comments)   Pt doesn't recall reaction   Propylene Glycol Itching   Severe itching   Balsam [place]-castor [amberderm] Itching, Rash   Ciprofloxacin Hives, Rash   Diphenhydramine Hcl Rash   Other Itching, Rash   Botanicals , Fragrance , methyldibromo glutaronitrile, detergents can only use "All free and clear"      Medication List    TAKE these medications   B-12 2500 MCG Tabs Take 2,500 mcg by mouth daily.     Biotin 5000 MCG Caps Take 5,000 mcg by mouth daily.   CERAVE Lotn Apply 1 application topically 3 (three) times daily as needed (itching).   ezetimibe 10 MG tablet Commonly known as:  ZETIA TAKE ONE TABLET BY MOUTH DAILY.   hydrOXYzine 10 MG tablet Commonly known as:  ATARAX/VISTARIL Take 10 mg by mouth 3 (three) times daily as needed for itching.   losartan-hydrochlorothiazide 100-25 MG tablet Commonly known as:  HYZAAR TAKE ONE (1) TABLET BY MOUTH EVERY DAY   metoprolol succinate 25 MG 24 hr tablet Commonly known as:  TOPROL-XL Take 1 tablet (25 mg total) by mouth daily.   omeprazole 20 MG tablet Commonly known as:  PRILOSEC OTC Take 20 mg by mouth daily.   potassium chloride 10 MEQ tablet Commonly known as:  K-DUR,KLOR-CON Take 1 tablet (10 mEq total) by mouth daily.   pyridoxine 100 MG tablet Commonly known as:  B-6 Take 100 mg by mouth daily.   temazepam 15 MG capsule Commonly known as:  RESTORIL Take 15-30 mg by mouth at bedtime as needed for sleep (sleep). Reported on 08/26/2015   traMADol 50 MG tablet Commonly known as:  ULTRAM Take 1-2 tablets (50-100 mg total) by mouth every 6 (six) hours as needed for moderate pain.      Follow-up Information    Netta Cedars, MD. Call in 2 week(s).   Specialty:  Orthopedic Surgery Why:  [phone] Contact information: [address] [place] [address] [phone]           Signed: Arlee Muslim, PA-C Orthopaedic Surgery 09/24/2016, 8:42 AM

## 2016-09-24 NOTE — Evaluation (Signed)
Physical Therapy Evaluation and Discharge Patient Details Name: Kelsey NeasGail C Gerardo MRN: [id number] DOB: 10/01/1933 Today's Date: 09/24/2016   History of Present Illness  83 y.o. female sustained fall resulting in Rt humeral Fx. S/p Rt reverse shoulder arthorplasty.  Clinical Impression  Patient evaluated by Physical Therapy with no further acute PT needs identified. All education has been completed and the patient has no further questions. Demonstrates safe mobility with gait, including higher level dynamic activities without need for physical assist. She will have 24/7 assist available at home, as needed when d/c. She has no concerns at this time and feels confident with her abilities. See below for any follow-up Physical Therapy or equipment needs. PT is signing off. Thank you for this referral.     Follow Up Recommendations No PT follow up;Supervision - Intermittent    Equipment Recommendations  None recommended by PT    Recommendations for Other Services       Precautions / Restrictions Precautions Precautions: Shoulder Shoulder Interventions: Shoulder sling/immobilizer Precaution Booklet Issued: No Precaution Comments: Verbalized precautions with patient Required Braces or Orthoses: Sling Restrictions Weight Bearing Restrictions: Yes RUE Weight Bearing: Non weight bearing      Mobility  Bed Mobility Overal bed mobility: Modified Independent             General bed mobility comments: extra time  Transfers Overall transfer level: Modified independent Equipment used: None             General transfer comment: A little slow to rise with heavy use of LUE, practiced x2 from bed without physical assistance.  Ambulation/Gait Ambulation/Gait assistance: Modified independent (Device/Increase time) Ambulation Distance (Feet): 150 Feet Assistive device: None Gait Pattern/deviations: Step-through pattern;Drifts right/left Gait velocity: decreased Gait velocity  interpretation: Below normal speed for age/gender General Gait Details: Minimal drift noted with gait. No loss of balance. Tolerated higher level dynamic gait activities safely with a little difficulty. Inculding high marching, quick turns, backwards stepping, navigating over and around obstacles.  Stairs            Wheelchair Mobility    Modified Rankin (Stroke Patients Only)       Balance Overall balance assessment: Modified Independent                               Standardized Balance Assessment Standardized Balance Assessment : Dynamic Gait Index   Dynamic Gait Index Level Surface: Normal Change in Gait Speed: Normal Gait with Horizontal Head Turns: Normal Gait with Vertical Head Turns: Mild Impairment Gait and Pivot Turn: Mild Impairment Step Over Obstacle: Mild Impairment Step Around Obstacles: Normal Steps: Mild Impairment Total Score: 20       Pertinent Vitals/Pain Pain Assessment: Faces Faces Pain Scale: Hurts a little bit Pain Location: Rt shoulder Pain Descriptors / Indicators: Aching Pain Intervention(s): Monitored during session;Repositioned    Home Living Family/patient expects to be discharged to:: Private residence Living Arrangements: Spouse/significant other Available Help at Discharge: Family;Available 24 hours/day Type of Home: House Home Access: Level entry     Home Layout: One level Home Equipment: Cane - single point;Shower seat - built in;Grab bars - tub/shower;Grab bars - toilet      Prior Function Level of Independence: Independent               Hand Dominance   Dominant Hand: Right    Extremity/Trunk Assessment   Upper Extremity Assessment Upper Extremity Assessment: Defer to OT evaluation (  In sling appropriately)    Lower Extremity Assessment Lower Extremity Assessment: Overall WFL for tasks assessed    Cervical / Trunk Assessment Cervical / Trunk Assessment: Normal  Communication    Communication: No difficulties  Cognition Arousal/Alertness: Awake/alert Behavior During Therapy: WFL for tasks assessed/performed Overall Cognitive Status: Within Functional Limits for tasks assessed                                        General Comments General comments (skin integrity, edema, etc.): Ice for Rt shoulder encouraged    Exercises General Exercises - Lower Extremity Ankle Circles/Pumps: AROM;Both;15 reps;Supine   Assessment/Plan    PT Assessment Patent does not need any further PT services  PT Problem List         PT Treatment Interventions      PT Goals (Current goals can be found in the Care Plan section)  Acute Rehab PT Goals Patient Stated Goal: Go home PT Goal Formulation: All assessment and education complete, DC therapy    Frequency     Barriers to discharge        Co-evaluation               AM-PAC PT "6 Clicks" Daily Activity  Outcome Measure Difficulty turning over in bed (including adjusting bedclothes, sheets and blankets)?: None Difficulty moving from lying on back to sitting on the side of the bed? : None Difficulty sitting down on and standing up from a chair with arms (e.g., wheelchair, bedside commode, etc,.)?: A Little Help needed moving to and from a bed to chair (including a wheelchair)?: None Help needed walking in hospital room?: None Help needed climbing 3-5 steps with a railing? : A Little 6 Click Score: 22    End of Session Equipment Utilized During Treatment: Other (comment) (Rt UE sling) Activity Tolerance: Patient tolerated treatment well Patient left: in bed;with call bell/phone within reach;with SCD's reapplied   PT Visit Diagnosis: Unsteadiness on feet (R26.81);History of falling (Z91.81);Pain Pain - Right/Left: Right Pain - part of body: Shoulder    Time: 1610-9604 PT Time Calculation (min) (ACUTE ONLY): 16 min   Charges:   PT Evaluation $PT Eval Low Complexity: 1 Procedure     PT G  Codes:        Charlsie Merles, PT, DPT    Berton Mount 09/24/2016, 10:08 AM

## 2016-09-24 NOTE — Evaluation (Addendum)
Occupational Therapy Evaluation Patient Details Name: [name] MRN: [id number] DOB: 08/28/1933 Today's Date: 09/24/2016    History of Present Illness 83 y.o. female sustained fall resulting in Rt humeral Fx. S/p Rt reverse shoulder arthorplasty.   Clinical Impression   Patient is s/p R TSA reverse  surgery resulting in functional limitations due to the deficits listed below (see OT problem list). PTA was independent with all adls and drove. Pt with recent cataract surg and education on fall risk with light transitions ( especially at night) Pt demonstrates all education with family during session at supervision level when completing it together.  Patient will benefit from skilled OT acutely to increase independence and safety with ADLS to allow discharge HOme. .    Follow Up Recommendations  No OT follow up;DC plan and follow up therapy as arranged by surgeon    Equipment Recommendations  None recommended by OT    Recommendations for Other Services       Precautions / Restrictions Precautions Precautions: Shoulder Shoulder Interventions: Shoulder sling/immobilizer Precaution Booklet Issued: No Precaution Comments: provided post surgery shoulder handout Required Braces or Orthoses: Sling Restrictions Weight Bearing Restrictions: Yes RUE Weight Bearing: Non weight bearing      Mobility Bed Mobility Overal bed mobility: Modified Independent             General bed mobility comments: exiting on the L side and education about use of recliner  Transfers Overall transfer level: Needs assistance Equipment used: None Transfers: Sit to/from Stand Sit to Stand: Min assist         General transfer comment: pt needed extended time and education on hand placment    Balance Overall balance assessment: Modified Independent                                         ADL either performed or assessed with clinical judgement   ADL Overall ADL's : Needs  assistance/impaired Eating/Feeding: Set up   Grooming: Set up   Upper Body Bathing: Minimal assistance   Lower Body Bathing: Minimal assistance   Upper Body Dressing : Minimal assistance   Lower Body Dressing: Minimal assistance   Toilet Transfer: Minimal assistance           Functional mobility during ADLs: Minimal assistance General ADL Comments: spouse present and demonstrates pendulum exercises with patient     Vision   Additional Comments: pt s/p cataract surg on Monday 09/18/16 requires drops for eyes. Pt reports everything being "blurry" but that she can see     Perception     Praxis      Pertinent Vitals/Pain Pain Assessment: Faces Faces Pain Scale: Hurts a little bit Pain Location: Rt shoulder Pain Descriptors / Indicators: Aching Pain Intervention(s): Monitored during session;Premedicated before session;Repositioned;Ice applied     Hand Dominance Right   Extremity/Trunk Assessment Upper Extremity Assessment Upper Extremity Assessment: RUE deficits/detail RUE Deficits / Details: s/p surg   Lower Extremity Assessment Lower Extremity Assessment: Overall WFL for tasks assessed   Cervical / Trunk Assessment Cervical / Trunk Assessment: Normal   Communication Communication Communication: No difficulties   Cognition Arousal/Alertness: Awake/alert Behavior During Therapy: WFL for tasks assessed/performed Overall Cognitive Status: Within Functional Limits for tasks assessed  General Comments  ice applied to R shoulder    Exercises Exercises: Shoulder   Shoulder Instructions Shoulder Instructions Donning/doffing shirt without moving shoulder: Minimal assistance Method for sponge bathing under operated UE: Minimal assistance Donning/doffing sling/immobilizer: Minimal assistance Correct positioning of sling/immobilizer: Supervision/safety Pendulum exercises (written home exercise program): Moderate  assistance ROM for elbow, wrist and digits of operated UE: Supervision/safety Sling wearing schedule (on at all times/off for ADL's): Supervision/safety Positioning of UE while sleeping: Minimal assistance  Lap slides supervision 10 reps seated Pendulums min (A) 10 reps each exercises     Home Living Family/patient expects to be discharged to:: Private residence Living Arrangements: Spouse/significant other Available Help at Discharge: Family;Available 24 hours/day Type of Home: House Home Access: Level entry     Home Layout: One level     Bathroom Shower/Tub: Walk-in shower         Home Equipment: Cane - single point;Shower seat - built in;Grab bars - tub/shower;Grab bars - toilet   Additional Comments: pt will have daughter spouse and hired (A) upon d/c. Pt is very direct and impulsive at baseline per daugther.      Prior Functioning/Environment Level of Independence: Independent                 OT Problem List:        OT Treatment/Interventions:      OT Goals(Current goals can be found in the care plan section) Acute Rehab OT Goals Patient Stated Goal: Go home OT Goal Formulation: With patient Time For Goal Achievement: 10/08/16 Potential to Achieve Goals: Good  OT Frequency:     Barriers to D/C:            Co-evaluation              AM-PAC PT "6 Clicks" Daily Activity     Outcome Measure Help from another person eating meals?: None Help from another person taking care of personal grooming?: A Little Help from another person toileting, which includes using toliet, bedpan, or urinal?: A Little Help from another person bathing (including washing, rinsing, drying)?: A Little Help from another person to put on and taking off regular upper body clothing?: A Little Help from another person to put on and taking off regular lower body clothing?: A Little 6 Click Score: 19   End of Session Equipment Utilized During Treatment: Gait belt  Activity  Tolerance: Patient tolerated treatment well Patient left: in bed;with family/visitor present;with call bell/phone within reach  OT Visit Diagnosis: Unsteadiness on feet (R26.81)                Time: 1610-9604 OT Time Calculation (min): 41 min Charges:  OT General Charges $OT Visit: 1 Procedure OT Evaluation $OT Eval Moderate Complexity: 1 Procedure OT Treatments $Self Care/Home Management : 8-22 mins $Therapeutic Exercise: 8-22 mins G-Codes:      Mateo Flow   OTR/L Pager: [phone] Office: [phone] .   Boone Master B 09/24/2016, 1:10 PM

## 2016-09-25 NOTE — Progress Notes (Signed)
Occupational Therapy Treatment Patient Details Name: [name] MRN: [id number] DOB: December 15, 1933 Today's Date: 09/25/2016    History of present illness 83 y.o. female sustained fall resulting in Rt humeral Fx. S/p Rt reverse shoulder arthorplasty.   OT comments  Pt is at adequate level for d/c home at this time and able to complete exercises from memory supervision level. Pt able to even sequence steps without cues. Pt reports d/c with spouse and son today and daughter to (A) all day. .  Follow Up Recommendations  No OT follow up;DC plan and follow up therapy as arranged by surgeon    Equipment Recommendations  None recommended by OT    Recommendations for Other Services      Precautions / Restrictions Precautions Precautions: Shoulder Shoulder Interventions: Shoulder sling/immobilizer Precaution Comments: pt recalled all shoulder exercises from memory this session Required Braces or Orthoses: Sling Restrictions Weight Bearing Restrictions: Yes RUE Weight Bearing: Non weight bearing       Mobility Bed Mobility                  Transfers Overall transfer level: Needs assistance   Transfers: Sit to/from Stand Sit to Stand: Supervision         General transfer comment: s    Balance                                           ADL either performed or assessed with clinical judgement   ADL Overall ADL's : Needs assistance/impaired Eating/Feeding: Set up   Grooming: Set up Grooming Details (indicate cue type and reason): completed oral care in bathroom                 Toilet Transfer: Supervision/safety           Functional mobility during ADLs: Supervision/safety       Vision       Perception     Praxis      Cognition Arousal/Alertness: Awake/alert Behavior During Therapy: WFL for tasks assessed/performed Overall Cognitive Status: Within Functional Limits for tasks assessed                                           Exercises Exercises: Shoulder Shoulder Exercises Pendulum Exercise: PROM;Left;15 reps;Standing Elbow Flexion: AROM;Left;15 reps;Seated Wrist Flexion: AROM;Left;15 reps;Seated Digit Composite Flexion: AROM;Left;15 reps;Seated   Shoulder Instructions Shoulder Instructions Donning/doffing shirt without moving shoulder: Supervision/safety Method for sponge bathing under operated UE: Supervision/safety Donning/doffing sling/immobilizer: Supervision/safety Pendulum exercises (written home exercise program): Supervision/safety ROM for elbow, wrist and digits of operated UE: Supervision/safety Sling wearing schedule (on at all times/off for ADL's): Supervision/safety Positioning of UE while sleeping: Supervision/safety     General Comments      Pertinent Vitals/ Pain       Pain Assessment: No/denies pain  Home Living                                          Prior Functioning/Environment              Frequency           Progress Toward Goals  OT Goals(current goals can now be  found in the care plan section)  Progress towards OT goals: Progressing toward goals  Acute Rehab OT Goals Patient Stated Goal: Go home OT Goal Formulation: With patient Time For Goal Achievement: 10/08/16 Potential to Achieve Goals: Good ADL Goals Pt Will Transfer to Toilet: with supervision;regular height toilet Pt/caregiver will Perform Home Exercise Program: With written HEP provided;With Supervision  Plan Discharge plan remains appropriate    Co-evaluation                 AM-PAC PT "6 Clicks" Daily Activity     Outcome Measure   Help from another person eating meals?: None Help from another person taking care of personal grooming?: A Little Help from another person toileting, which includes using toliet, bedpan, or urinal?: A Little Help from another person bathing (including washing, rinsing, drying)?: A Little Help from another person to  put on and taking off regular upper body clothing?: A Little Help from another person to put on and taking off regular lower body clothing?: A Little 6 Click Score: 19    End of Session Equipment Utilized During Treatment: Gait belt  OT Visit Diagnosis: Unsteadiness on feet (R26.81)   Activity Tolerance Patient tolerated treatment well   Patient Left in bed;with family/visitor present;with call bell/phone within reach   Nurse Communication          Time: [id number] OT Time Calculation (min): 18 min  Charges: OT General Charges $OT Visit: 1 Procedure OT Treatments $Therapeutic Exercise: 8-22 mins   Mateo FlowJones, Brynn   OTR/L Pager: [phone] Office: [phone] .    Boone MasterJones, Laurette Villescas B 09/25/2016, 10:10 AM

## 2016-09-25 NOTE — Progress Notes (Signed)
   Subjective: 2 Days Post-Op Procedure(s) (LRB): REVERSE SHOULDER ARTHROPLASTY (Right) Patient reports pain as mild.  Block has resolved and she is moving fine without numbness. Patient seen in rounds for Dr. Ranell PatrickNorris. Patient is well, and has had no acute complaints or problems Patient is ready to go home today.  Objective: Vital signs in last 24 hours: Temp:  [98.4 F (36.9 C)-100.4 F (38 C)] 100.4 F (38 C) (07/15 0527) Pulse Rate:  [67-81] 76 (07/15 0527) Resp:  [16] 16 (07/14 1459) BP: (122-152)/(52-66) 129/61 (07/15 0527) SpO2:  [85 %-94 %] 94 % (07/15 0527)  Intake/Output from previous day:  Intake/Output Summary (Last 24 hours) at 09/25/16 0905 Last data filed at 09/25/16 0816  Gross per 24 hour  Intake             1120 ml  Output              100 ml  Net             1020 ml    Intake/Output this shift: Total I/O In: 240 [P.O.:240] Out: 100 [Urine:100]  Labs:  Recent Labs  09/23/16 1150 09/24/16 0530  HGB 12.9 10.7*    Recent Labs  09/23/16 1150 09/24/16 0530  WBC 10.4  --   RBC 4.32  --   HCT 40.2 34.1*  PLT 212  --     Recent Labs  09/23/16 1150 09/24/16 0530  NA 140 136  K 3.8 3.5  CL 102 100*  CO2 31 28  BUN 10 8  CREATININE 0.85 0.87  GLUCOSE 123* 140*  CALCIUM 9.3 8.4*   No results for input(s): LABPT, INR in the last 72 hours.  EXAM: General - Patient is Alert, Appropriate and Oriented Extremity - Neurovascular intact Sensation intact distally Intact pulses distally Dressing - clean, dry Motor Function - intact, moving hand and fingers well on exam.   Assessment/Plan: 2 Days Post-Op Procedure(s) (LRB): REVERSE SHOULDER ARTHROPLASTY (Right) Procedure(s) (LRB): REVERSE SHOULDER ARTHROPLASTY (Right) Past Medical History:  Diagnosis Date  . Fibrocystic breast disease   . GERD (gastroesophageal reflux disease)   . H/O: hysterectomy   . Humerus fracture    right proximal  . Hypercholesterolemia   . Hypertension 1984   renal artery   Active Problems:   S/P shoulder replacement, right  Estimated body mass index is 29.08 kg/m as calculated from the following:   Height as of this encounter: 5\' 2"  (1.575 m).   Weight as of this encounter: 72.1 kg (159 lb). Up with therapy Discharge home  Diet - Cardiac diet Follow up - in 2 weeks Activity - up ad lib Disposition - Home Condition Upon Discharge - Stable D/C Meds - See DC Summary  Yolonda KidaJason Patrick Rogers

## 2016-09-25 NOTE — Progress Notes (Signed)
Discharge instructions and prescription provided to patient.  Additional ice packs and ted hose provided.  IV removed.  Family present.  No questions at time of discharge.

## 2016-09-26 ENCOUNTER — Encounter (HOSPITAL_COMMUNITY): Payer: Self-pay | Admitting: Orthopedic Surgery

## 2016-09-26 NOTE — Op Note (Signed)
NAME:  [name], [name] NO.:  [id number]  MEDICAL RECORD NO.:  [id number]  LOCATION:                                 FACILITY:  PHYSICIAN:  Almedia Balls. Ranell Patrick, M.D.      DATE OF BIRTH:  DATE OF PROCEDURE:  09/23/2016 DATE OF DISCHARGE:                              OPERATIVE REPORT   PREOPERATIVE DIAGNOSIS:  Displaced right proximal humerus fracture.  POSTOPERATIVE DIAGNOSIS:  Displaced right proximal humerus fracture.  PROCEDURE PERFORMED:  Right shoulder reverse total shoulder arthroplasty using DePuy Delta Xtend prosthesis. Repair of lesser tuberosity/subscapularis and teres minor/inferior greater tuberosity.  ATTENDING SURGEON:  Almedia Balls. Ranell Patrick, M.D.  ASSISTANT:  Donnie Coffin. Dixon PA-C, who was scrubbed during the entire procedure and necessary for satisfactory completion of surgery.  ANESTHESIA:  General anesthesia was used plus interscalene block.  ESTIMATED BLOOD LOSS:  300 mL.  FLUID REPLACED:  1200 mL crystalloid.  INSTRUMENT COUNTS:  Correct.  COMPLICATIONS:  No complications.  PERIOPERATIVE ANTIBIOTICS:  Given.  INDICATIONS:  The patient is an 83 year old female with a history of a ground-level fall yesterday.  She [facility] the [facility] with a grossly displaced 4 part proximal humerus fracture.  The patient's humeral head was rotated pointing laterally and there was extensive comminution at the fracture site.  The patient was neurologically intact.  She presented as an outpatient.  We discussed options for management with the patient given the 83 years of age, evidence of arthritis on x-ray, high level of comminution, and extreme displacement of the humeral head.  I did recommend reverse shoulder arthroplasty provide the most predictable result for her in terms of function and also quick pain relief.  Risks and benefits of surgery were discussed and informed consent obtained.  DESCRIPTION OF PROCEDURE:  After adequate level of  anesthesia achieved, the patient was positioned in the modified beach-chair position.  Right shoulder was correctly identified, sterilely prepped and draped in usual manner, time-out was called.  We entered the shoulder using a deltopectoral incision, started coracoid process, extending down to the anterior humerus, dissection down through subcutaneous tissues using needle-tip Bovie, identified cephalic vein, took it laterally with the deltoid, pectoralis taken medially, conjoint tendon identified, retracted medially.  We identified the fractured humerus.  There was quite a bit of blood in the soft tissues around the shoulder.  We evacuated that and then first tenodesed the biceps in situ with a figure- of-eight x2, 0 Vicryl suture into the packed tendon.  Next, we then released the rotator interval and the subscapularis.  I used an osteotome to remove a thin layer of lesser tuberosity off with the subscapularis and placed #2 Hi-Fi suture in a modified Mason-Allen suture technique into the free end of the tendon medial to the lesser tuberosity for repair.  We then went ahead and removed the humeral head. It was rotated completely back towards the posterior aspect of the shoulder and not pointing at the glenoid at all.  We osteotomized the head away from the greater tuberosity and used a shell of that.  We removed the supraspinatus and infraspinatus.  I did preserve teres minor with  a portion of the greater tuberosity and placed #2 FiberWire suture lateral to the greater tuberosity fragment.  We removed the humeral head.  We used the humeral head for bone graft.  We next went ahead and prepared the glenoid.  We first removed the glenoid labrum such that we had good visualization of the native glenoid face.  We removed the cartilage from that.  There was a thin layer of cartilage remaining, but by no means normal.  We identified the glenoid face and drilled a central guide pin and then  reamed for the metaglene with the base plate. We then drilled our central PEG hole, impacted the base plate into position and we placed a 48 screw inferiorly interestingly just with her anatomy, the superior screw was only an 18, but the posterior screw I was able to angle and get a 36 screw.  The anterior screw could not be placed as it was sitting right on the anterior lip of the glenoid and her glenoid was so small, we could not put anterior screw in, but we had 3 good screws, a solid secure base plate.  We locked the screws to make them fixed angle and then we placed a 38 standard glenosphere into position.  We screwed that onto the base plate and impacted and screwed again.  We had a nice coverage with the 38 standard glenosphere.  Next, we prepared the humeral side.  We reamed up to a size 10.  We could not get the 12 reamer down and felt like that bone was thin enough that we did not want to push it, so we went with the 10 stem and we took initially the mono block cemented polished stem and put that in there, and then actually trialed with the HA coated stem and felt like that was a better fit metaphyseal wise proximally.  So, we placed a 38, +3 poly trial in place and then reduced the shoulder and I was able to gauge by pulling on the elbow and getting to appropriate tension on the conjoined tendon.  We were able to gauge the length on the how much of the humeral stem had to be exposed.  We marked that with a marking pen.  We removed the trial components.  We then irrigated the humeral canal and dried it and then obtained the HA coated stem and also with planning and trying to repair the tuberosities and want to have the HA coating proximally as well.  So, the HA coated size 1 neutral metaphysis set on the 0 setting and placed on the size 10 stem HA coated, and we had that screwed together and then we vacuum mixed the cement with a DePuy 5 viscosity cement and filled the canal with  cement and then cemented the component in place at the appropriate height.  We had marked the head at time. Once that was fully fixed to this for the poly methylmethacrylate, then we trialed again with a +38 +3 and we actually went up to a 38+ 9 poly to get a good stability.  We had a nice little reduction click when it popped in place conjoined nice and tight, not too much so.  The axillary nerve under decent tension, but not too much in tension and then no gapping with inferior pole external rotation.  We irrigated the shoulder thoroughly.  We were pleased with the stability of this implant.  We selected the real 38+ 9 poly, impacted that in position, reduced  the shoulder again with a nice little pop.  We then went ahead and meticulously repaired the portion of the greater tuberosity with the teres minor intact and also the entire lesser tuberosity shell like fragment with the subscap intact.  We debulked those tuberosities that would be too bulky.  We used extensive bone grafting around the proximal humerus on the anterior side for the humeral component and up against the patient's native humeral shaft, and we did around the world stitch behind the stem and took that up to lateral to the greater tuberosity, medial to the lesser tuberosity.  We also used the sutures from the inferior portion of the subscap and inferior portion of the teres minor and took those down through soft tissue through the packed tendon and also through the soft tissue with the biceps groove, but we had an excellent tuberosity to shaft fixation also tuberosity sutures with those modified W sutures and then we ran around the world stitch to compressive and everything to together, but we had very solid tuberosity repair, everything was stable as a unit and did not restrict her motion with the reverse shoulder, so we are hoping this will give her better external rotation with her arm elevated and then also better  push-off strength.  She tolerated this very well, irrigated thoroughly and then closed the deltopectoral interval with 0 Vicryl suture followed by 2 Vicryl for subcutaneous closure and 4- 0 Monocryl for skin.  Steri-Strips applied followed by sterile dressing. The patient tolerated the surgery well.     Almedia Balls. Ranell Patrick, M.D.   ______________________________ Almedia Balls. Ranell Patrick, M.D.    SRN/MEDQ  D:  09/23/2016  T:  09/24/2016  Job:  [id number]

## 2016-10-05 ENCOUNTER — Other Ambulatory Visit (HOSPITAL_COMMUNITY): Payer: Medicare Other

## 2016-10-10 DIAGNOSIS — Z96611 Presence of right artificial shoulder joint: Secondary | ICD-10-CM | POA: Diagnosis not present

## 2016-10-10 DIAGNOSIS — Z471 Aftercare following joint replacement surgery: Secondary | ICD-10-CM | POA: Diagnosis not present

## 2016-10-26 DIAGNOSIS — L259 Unspecified contact dermatitis, unspecified cause: Secondary | ICD-10-CM | POA: Insufficient documentation

## 2016-10-26 DIAGNOSIS — Z87891 Personal history of nicotine dependence: Secondary | ICD-10-CM | POA: Diagnosis not present

## 2016-10-26 DIAGNOSIS — L299 Pruritus, unspecified: Secondary | ICD-10-CM | POA: Diagnosis not present

## 2016-11-07 ENCOUNTER — Ambulatory Visit (HOSPITAL_COMMUNITY)
Admission: RE | Admit: 2016-11-07 | Discharge: 2016-11-07 | Disposition: A | Discharge status: Home / Self Care | Payer: Medicare Other | Source: Ambulatory Visit | Attending: Pulmonary Disease | Admitting: Pulmonary Disease

## 2016-11-07 DIAGNOSIS — Z780 Asymptomatic menopausal state: Secondary | ICD-10-CM | POA: Diagnosis not present

## 2016-11-07 DIAGNOSIS — M810 Age-related osteoporosis without current pathological fracture: Secondary | ICD-10-CM | POA: Insufficient documentation

## 2016-11-08 DIAGNOSIS — Z96611 Presence of right artificial shoulder joint: Secondary | ICD-10-CM | POA: Diagnosis not present

## 2016-11-08 DIAGNOSIS — Z471 Aftercare following joint replacement surgery: Secondary | ICD-10-CM | POA: Diagnosis not present

## 2016-12-09 ENCOUNTER — Other Ambulatory Visit: Payer: Self-pay | Admitting: Physician Assistant

## 2016-12-15 DIAGNOSIS — Z471 Aftercare following joint replacement surgery: Secondary | ICD-10-CM | POA: Diagnosis not present

## 2016-12-15 DIAGNOSIS — Z96611 Presence of right artificial shoulder joint: Secondary | ICD-10-CM | POA: Diagnosis not present

## 2017-01-07 DIAGNOSIS — Z23 Encounter for immunization: Secondary | ICD-10-CM | POA: Diagnosis not present

## 2017-03-10 ENCOUNTER — Other Ambulatory Visit: Payer: Self-pay | Admitting: Cardiovascular Disease

## 2017-03-21 DIAGNOSIS — Z96611 Presence of right artificial shoulder joint: Secondary | ICD-10-CM | POA: Diagnosis not present

## 2017-03-21 DIAGNOSIS — Z471 Aftercare following joint replacement surgery: Secondary | ICD-10-CM | POA: Diagnosis not present

## 2017-03-23 ENCOUNTER — Other Ambulatory Visit (HOSPITAL_COMMUNITY)
Admission: RE | Admit: 2017-03-23 | Discharge: 2017-03-23 | Disposition: A | Discharge status: Home / Self Care | Payer: Medicare Other | Source: Ambulatory Visit | Attending: Pulmonary Disease | Admitting: Pulmonary Disease

## 2017-03-23 ENCOUNTER — Ambulatory Visit (HOSPITAL_COMMUNITY)
Admission: RE | Admit: 2017-03-23 | Discharge: 2017-03-23 | Disposition: A | Discharge status: Home / Self Care | Payer: Medicare Other | Source: Ambulatory Visit | Attending: Pulmonary Disease | Admitting: Pulmonary Disease

## 2017-03-23 ENCOUNTER — Other Ambulatory Visit (HOSPITAL_COMMUNITY): Payer: Self-pay | Admitting: Pulmonary Disease

## 2017-03-23 DIAGNOSIS — M25561 Pain in right knee: Secondary | ICD-10-CM

## 2017-03-23 DIAGNOSIS — M170 Bilateral primary osteoarthritis of knee: Secondary | ICD-10-CM | POA: Diagnosis not present

## 2017-03-23 DIAGNOSIS — Z96643 Presence of artificial hip joint, bilateral: Secondary | ICD-10-CM | POA: Diagnosis not present

## 2017-03-23 DIAGNOSIS — M25552 Pain in left hip: Secondary | ICD-10-CM

## 2017-03-23 DIAGNOSIS — M85862 Other specified disorders of bone density and structure, left lower leg: Secondary | ICD-10-CM | POA: Insufficient documentation

## 2017-03-23 DIAGNOSIS — M179 Osteoarthritis of knee, unspecified: Secondary | ICD-10-CM | POA: Diagnosis not present

## 2017-03-23 DIAGNOSIS — M25562 Pain in left knee: Secondary | ICD-10-CM | POA: Insufficient documentation

## 2017-03-23 DIAGNOSIS — I10 Essential (primary) hypertension: Secondary | ICD-10-CM | POA: Insufficient documentation

## 2017-03-23 LAB — SEDIMENTATION RATE: SED RATE: 20 mm/h (ref 0–22)

## 2017-03-23 LAB — C-REACTIVE PROTEIN

## 2017-04-14 DIAGNOSIS — M170 Bilateral primary osteoarthritis of knee: Secondary | ICD-10-CM | POA: Diagnosis not present

## 2017-04-14 DIAGNOSIS — Z1231 Encounter for screening mammogram for malignant neoplasm of breast: Secondary | ICD-10-CM | POA: Diagnosis not present

## 2017-04-16 DIAGNOSIS — M179 Osteoarthritis of knee, unspecified: Secondary | ICD-10-CM | POA: Insufficient documentation

## 2017-04-16 DIAGNOSIS — M1710 Unilateral primary osteoarthritis, unspecified knee: Secondary | ICD-10-CM | POA: Insufficient documentation

## 2017-04-21 ENCOUNTER — Other Ambulatory Visit: Payer: Self-pay | Admitting: Cardiovascular Disease

## 2017-04-24 ENCOUNTER — Other Ambulatory Visit: Payer: Self-pay

## 2017-04-24 MED ORDER — LOSARTAN POTASSIUM-HCTZ 100-25 MG PO TABS: 1.0000 | ORAL_TABLET | Freq: Every day | ORAL | 0 refills | Status: DC

## 2017-05-05 ENCOUNTER — Encounter: Payer: Medicare Other | Admitting: Obstetrics & Gynecology

## 2017-05-05 DIAGNOSIS — M818 Other osteoporosis without current pathological fracture: Secondary | ICD-10-CM | POA: Diagnosis not present

## 2017-05-08 DIAGNOSIS — N904 Leukoplakia of vulva: Secondary | ICD-10-CM | POA: Diagnosis not present

## 2017-05-08 DIAGNOSIS — L900 Lichen sclerosus et atrophicus: Secondary | ICD-10-CM | POA: Diagnosis not present

## 2017-05-19 ENCOUNTER — Other Ambulatory Visit: Payer: Self-pay | Admitting: Cardiovascular Disease

## 2017-05-26 DIAGNOSIS — M7632 Iliotibial band syndrome, left leg: Secondary | ICD-10-CM | POA: Diagnosis not present

## 2017-05-26 DIAGNOSIS — M7062 Trochanteric bursitis, left hip: Secondary | ICD-10-CM | POA: Diagnosis not present

## 2017-05-26 DIAGNOSIS — M1712 Unilateral primary osteoarthritis, left knee: Secondary | ICD-10-CM | POA: Diagnosis not present

## 2017-06-16 ENCOUNTER — Other Ambulatory Visit: Payer: Self-pay | Admitting: Cardiovascular Disease

## 2017-06-16 DIAGNOSIS — R079 Chest pain, unspecified: Secondary | ICD-10-CM

## 2017-06-16 DIAGNOSIS — Z8249 Family history of ischemic heart disease and other diseases of the circulatory system: Secondary | ICD-10-CM

## 2017-06-16 DIAGNOSIS — R0602 Shortness of breath: Secondary | ICD-10-CM

## 2017-06-16 DIAGNOSIS — R55 Syncope and collapse: Secondary | ICD-10-CM

## 2017-07-18 ENCOUNTER — Telehealth: Payer: Self-pay | Admitting: Family Medicine

## 2017-07-18 NOTE — Telephone Encounter (Signed)
Left voicemail w/ new appt date/time, mailed reminder.

## 2017-07-20 ENCOUNTER — Ambulatory Visit: Payer: Medicare Other | Admitting: Cardiovascular Disease

## 2017-08-10 ENCOUNTER — Ambulatory Visit: Payer: Medicare Other | Admitting: Family Medicine

## 2017-08-17 ENCOUNTER — Ambulatory Visit: Payer: Medicare Other | Admitting: Family Medicine

## 2017-08-22 ENCOUNTER — Ambulatory Visit (INDEPENDENT_AMBULATORY_CARE_PROVIDER_SITE_OTHER): Payer: Medicare Other | Admitting: Cardiovascular Disease

## 2017-08-22 ENCOUNTER — Encounter: Payer: Self-pay | Admitting: Cardiovascular Disease

## 2017-08-22 VITALS — BP 146/84 | HR 76 | Ht 61.0 in | Wt 168.0 lb

## 2017-08-22 DIAGNOSIS — Z8679 Personal history of other diseases of the circulatory system: Secondary | ICD-10-CM

## 2017-08-22 DIAGNOSIS — E785 Hyperlipidemia, unspecified: Secondary | ICD-10-CM

## 2017-08-22 DIAGNOSIS — I10 Essential (primary) hypertension: Secondary | ICD-10-CM

## 2017-08-22 NOTE — Patient Instructions (Signed)
Your physician wants you to follow-up in:  1 year with Dr.Koneswaran You will receive a reminder letter in the mail two months in advance. If you don't receive a letter, please call our office to schedule the follow-up appointment.    Your physician recommends that you continue on your current medications as directed. Please refer to the Current Medication list given to you today.    If you need a refill on your cardiac medications before your next appointment, please call your pharmacy.      No lab work or tests ordered today.      Thank you for choosing [facility] !        

## 2017-08-22 NOTE — Progress Notes (Signed)
SUBJECTIVE: The patient presents for past due follow-up.  She has a history of hypertension, hypercholesterolemia, and a remote history of renal artery stenosis status post bilateral angioplasty.  She underwent a nuclear stress test in May 2010 which showed no evidence of ischemia, LVEF 76%.  She has no known history of coronary artery disease.  The patient denies any symptoms of chest pain, palpitations, shortness of breath, lightheadedness, dizziness, leg swelling, orthopnea, PND, and syncope.  She attends first [employer] in [place] and is very involved with her church.  Her husband works for her son in [place].  He used to be a Comptroller and went to [employer].  She decided not to take her antihypertensive medications for the past 3 days and recently began taking them today.  She said her blood pressure cuff is broken and intends to purchase a new one.  She had been seeing Dr. Patty Sermons for at least 25 years.  She moved here from [place] at the age of 73 with her husband.    Review of Systems: As per "subjective", otherwise negative.  Allergies  Allergen Reactions  . Baycol [Cerivastatin Sodium] Itching  . Crestor [Rosuvastatin Calcium] Itching  . Lipitor [Atorvastatin Calcium] Itching  . Morphine And Related Other (See Comments)    Hallucinations , skin crawling, "feels like elephants are crawling"  . Pravachol Other (See Comments)    Pt doesn't recall reaction  . Propylene Glycol Itching    Severe itching  . Balsam [place]-Castor [Amberderm] Itching and Rash  . Ciprofloxacin Hives and Rash  . Diphenhydramine Hcl Rash  . Other Itching and Rash    Botanicals , Fragrance , methyldibromo glutaronitrile, detergents can only use "All free and clear"    Current Outpatient Medications  Medication Sig Dispense Refill  . Biotin 5000 MCG CAPS Take 5,000 mcg by mouth daily.    . Emollient (CERAVE) LOTN Apply 1 application topically 3 (three) times daily as  needed (itching).    . ezetimibe (ZETIA) 10 MG tablet TAKE ONE TABLET BY MOUTH DAILY. 90 tablet 3  . losartan-hydrochlorothiazide (HYZAAR) 100-25 MG tablet TAKE ONE (1) TABLET BY MOUTH EVERY DAY 30 tablet 1  . metoprolol succinate (TOPROL-XL) 25 MG 24 hr tablet Take 1 tablet (25 mg total) by mouth daily. 90 tablet 2  . omeprazole (PRILOSEC OTC) 20 MG tablet Take 20 mg by mouth daily.    . potassium chloride (K-DUR) 10 MEQ tablet TAKE ONE (1) TABLET BY MOUTH EVERY DAY 90 tablet 1  . temazepam (RESTORIL) 15 MG capsule Take 15-30 mg by mouth at bedtime as needed for sleep (sleep). Reported on 08/26/2015     No current facility-administered medications for this visit.     Past Medical History:  Diagnosis Date  . Fibrocystic breast disease   . GERD (gastroesophageal reflux disease)   . H/O: hysterectomy   . Humerus fracture    right proximal  . Hypercholesterolemia   . Hypertension 1984   renal artery    Past Surgical History:  Procedure Laterality Date  . ABDOMINAL HYSTERECTOMY    . BELPHAROPTOSIS REPAIR    . breast biopsies     for benign lumps  . BREAST LUMPECTOMY  1985  . CATARACT EXTRACTION W/PHACO Left 08/22/2016   Procedure: CATARACT EXTRACTION PHACO AND INTRAOCULAR LENS PLACEMENT (IOC);  Surgeon: Gemma Payor, MD;  Location: [facility];  Service: Ophthalmology;  Laterality: Left;  CDE: 8.63  . CATARACT EXTRACTION W/PHACO Right 09/19/2016  Procedure: CATARACT EXTRACTION PHACO AND INTRAOCULAR LENS PLACEMENT (IOC);  Surgeon: Gemma PayorHunt, Kerry, MD;  Location: [facility];  Service: Ophthalmology;  Laterality: Right;  CDE: 9.96  . COLONOSCOPY    . RENAL ARTERY ANGIOPLASTY  1987   right Dr. Jean RosenthalJackson  . REVERSE SHOULDER ARTHROPLASTY Right 09/23/2016   Procedure: REVERSE SHOULDER ARTHROPLASTY;  Surgeon: Beverely LowNorris, Steve, MD;  [facility];  Service: Orthopedics;  Laterality: Right;  2 hrs  . TONSILLECTOMY    . TONSILLECTOMY    . TOTAL HIP ARTHROPLASTY     right  . VEIN LIGATION  1974   both legs    . WRIST FRACTURE SURGERY Right 2002  . WRIST FRACTURE SURGERY Left 2017    Social History   Socioeconomic History  . Marital status: Married    Spouse name: Not on file  . Number of children: Not on file  . Years of education: Not on file  . Highest education level: Not on file  Occupational History  . Not on file  Social Needs  . Financial resource strain: Not on file  . Food insecurity:    Worry: Not on file    Inability: Not on file  . Transportation needs:    Medical: Not on file    Non-medical: Not on file  Tobacco Use  . Smoking status: Former Smoker    Last attempt to quit: 03/15/1987    Years since quitting: 30.4  . Smokeless tobacco: Never Used  Substance and Sexual Activity  . Alcohol use: Yes    Alcohol/week: 0.0 oz    Comment: occasional  . Drug use: No  . Sexual activity: Not on file  Lifestyle  . Physical activity:    Days per week: Not on file    Minutes per session: Not on file  . Stress: Not on file  Relationships  . Social connections:    Talks on phone: Not on file    Gets together: Not on file    Attends religious service: Not on file    Active member of club or organization: Not on file    Attends meetings of clubs or organizations: Not on file    Relationship status: Not on file  . Intimate partner violence:    Fear of current or ex partner: Not on file    Emotionally abused: Not on file    Physically abused: Not on file    Forced sexual activity: Not on file  Other Topics Concern  . Not on file  Social History Narrative  . Not on file     Vitals:   08/22/17 1334  BP: (!) 146/84  Pulse: 76  SpO2: 95%  Weight: 168 lb (76.2 kg)  Height: 5\' 1"  (1.549 m)    Wt Readings from Last 3 Encounters:  08/22/17 168 lb (76.2 kg)  09/23/16 159 lb (72.1 kg)  08/17/16 166 lb (75.3 kg)     PHYSICAL EXAM General: NAD HEENT: Normal. Neck: No JVD, no thyromegaly. Lungs: Clear to auscultation bilaterally with normal respiratory  effort. CV: Regular rate and rhythm, normal S1/S2, no S3/S4, no murmur. No pretibial or periankle edema.  No carotid bruit.   Abdomen: Soft, nontender, no distention.  Neurologic: Alert and oriented.  Psych: Normal affect. Skin: Normal. Musculoskeletal: No gross deformities.    ECG: Most recent ECG reviewed.   Labs: Lab Results  Component Value Date/Time   K 3.5 09/24/2016 05:30 AM   BUN 8 09/24/2016 05:30 AM   CREATININE 0.87 09/24/2016  05:30 AM   CREATININE 0.98 (H) 10/26/2015 09:59 AM   ALT 14 12/25/2014 11:03 AM   TSH 2.52 10/04/2012 03:35 PM   HGB 10.7 (L) 09/24/2016 05:30 AM     Lipids: Lab Results  Component Value Date/Time   LDLCALC 107 10/26/2015 09:59 AM   CHOL 179 10/26/2015 09:59 AM   TRIG 113 10/26/2015 09:59 AM   HDL 49 10/26/2015 09:59 AM       ASSESSMENT AND PLAN: 1. Essential hypertension:  Pressure is mildly elevated.  She did not take her antihypertensive medication for the past 3 days and recently started taking them today.  She intends to purchase a new blood pressure cuff.  2. Hypercholesterolemia: Patient was intolerant to Crestor and is now on generic Zetia.   I will see when last lipids were assessed by PCP.  3. History of renal artery stenosis status post bilateral angioplasties in the remote past: BUN 8 and creatinine 0.87 on 09/24/2016. BP is mildly elevated today but she stopped taking her antihypertensive medications for the past 3 days and started taking them again today. If becomes difficult to control, would obtain renal artery Dopplers.  I will obtain a copy of most recent basic metabolic panel from her PCP, Dr. Juanetta Gosling.     Disposition: Follow up 1 year   Prentice Docker, M.D., F.A.C.C.

## 2017-08-28 DIAGNOSIS — G4700 Insomnia, unspecified: Secondary | ICD-10-CM | POA: Diagnosis not present

## 2017-08-28 DIAGNOSIS — I10 Essential (primary) hypertension: Secondary | ICD-10-CM | POA: Diagnosis not present

## 2017-08-28 DIAGNOSIS — H9201 Otalgia, right ear: Secondary | ICD-10-CM | POA: Diagnosis not present

## 2017-08-28 DIAGNOSIS — I773 Arterial fibromuscular dysplasia: Secondary | ICD-10-CM | POA: Diagnosis not present

## 2017-09-18 ENCOUNTER — Ambulatory Visit (HOSPITAL_COMMUNITY)
Admission: RE | Admit: 2017-09-18 | Discharge: 2017-09-18 | Disposition: A | Discharge status: Home / Self Care | Payer: Medicare Other | Source: Ambulatory Visit | Attending: Pulmonary Disease | Admitting: Pulmonary Disease

## 2017-09-18 ENCOUNTER — Other Ambulatory Visit (HOSPITAL_COMMUNITY): Payer: Self-pay | Admitting: Pulmonary Disease

## 2017-09-18 DIAGNOSIS — M542 Cervicalgia: Secondary | ICD-10-CM

## 2017-09-18 DIAGNOSIS — M8938 Hypertrophy of bone, other site: Secondary | ICD-10-CM | POA: Diagnosis not present

## 2017-09-18 DIAGNOSIS — M50323 Other cervical disc degeneration at C6-C7 level: Secondary | ICD-10-CM | POA: Insufficient documentation

## 2017-09-29 ENCOUNTER — Other Ambulatory Visit: Payer: Self-pay | Admitting: Cardiovascular Disease

## 2017-11-09 ENCOUNTER — Encounter (HOSPITAL_COMMUNITY): Payer: Self-pay

## 2017-11-09 ENCOUNTER — Encounter (HOSPITAL_COMMUNITY)
Admission: RE | Admit: 2017-11-09 | Discharge: 2017-11-09 | Disposition: A | Discharge status: Home / Self Care | Payer: Medicare Other | Source: Ambulatory Visit | Attending: Pulmonary Disease | Admitting: Pulmonary Disease

## 2017-11-09 DIAGNOSIS — M810 Age-related osteoporosis without current pathological fracture: Secondary | ICD-10-CM | POA: Diagnosis not present

## 2017-11-09 MED ORDER — DENOSUMAB 60 MG/ML ~~LOC~~ SOSY
60.0000 mg | PREFILLED_SYRINGE | Freq: Once | SUBCUTANEOUS | Status: AC
  Administered 2017-11-09: 60 mg via SUBCUTANEOUS
  Filled 2017-11-09: qty 1

## 2017-11-14 ENCOUNTER — Other Ambulatory Visit: Payer: Self-pay | Admitting: Cardiovascular Disease

## 2017-11-21 DIAGNOSIS — Z23 Encounter for immunization: Secondary | ICD-10-CM | POA: Diagnosis not present

## 2017-11-21 DIAGNOSIS — Z0000 Encounter for general adult medical examination without abnormal findings: Secondary | ICD-10-CM | POA: Diagnosis not present

## 2018-01-10 IMAGING — US US EXTREM LOW VENOUS*R*
1 series · 13 of 24 positions shown · non-contrast
Comparison: None.

CLINICAL DATA: 81-year-old female with a history of right lower
extremity pain.



[Series 1: us extrem low venous*right* · 0.07mm/px · 13 of 73 slices shown]
[im 1/73]
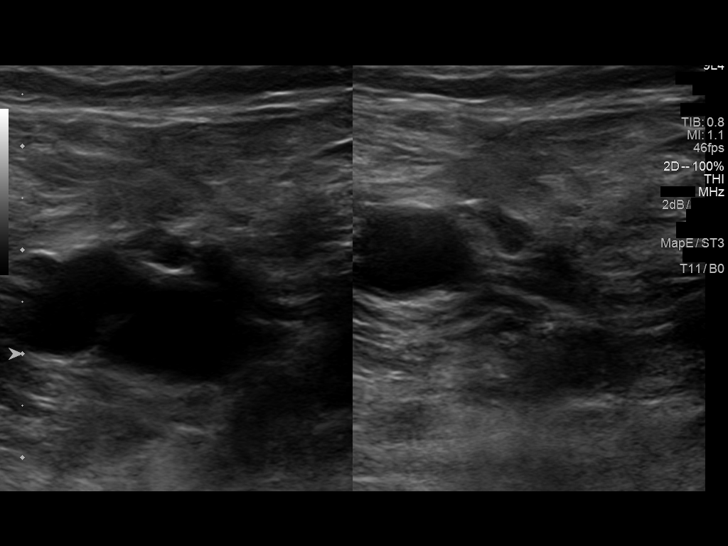
[im 7/73]
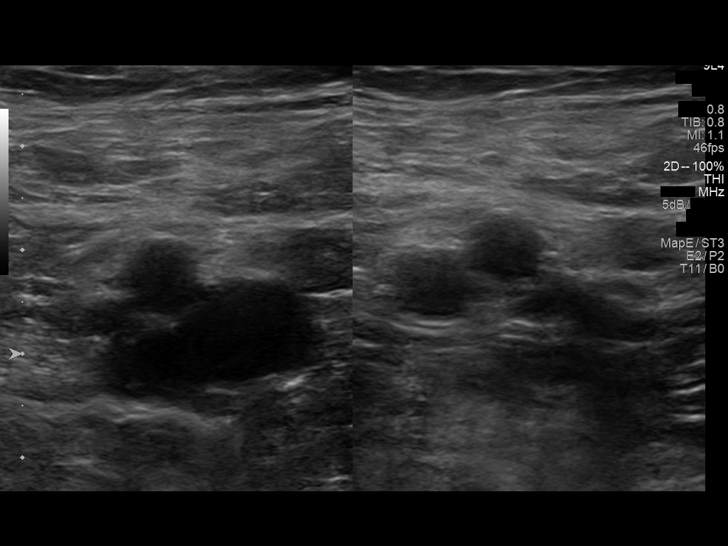
[im 13/73]
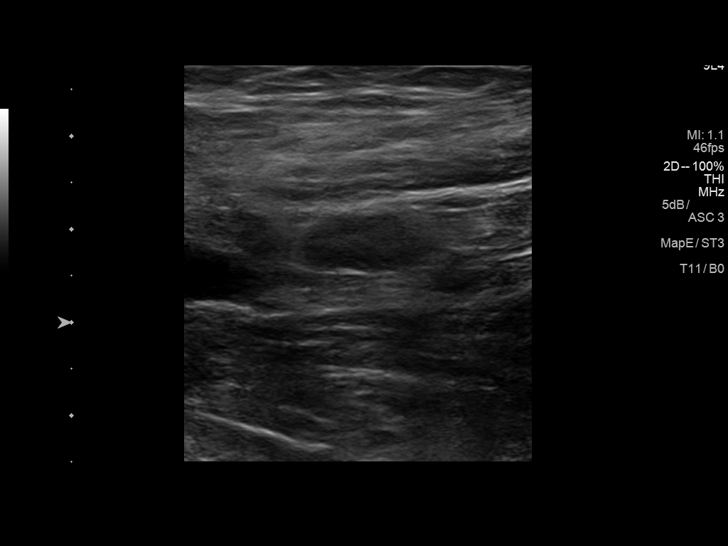
[im 19/73]
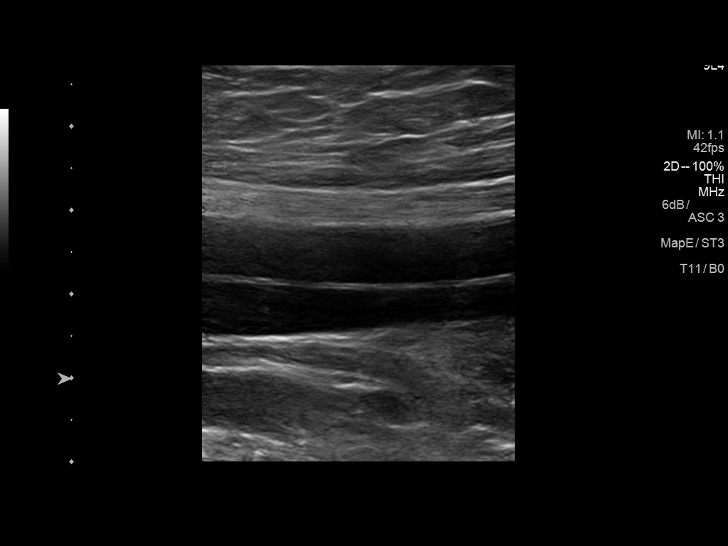
[im 26/73]
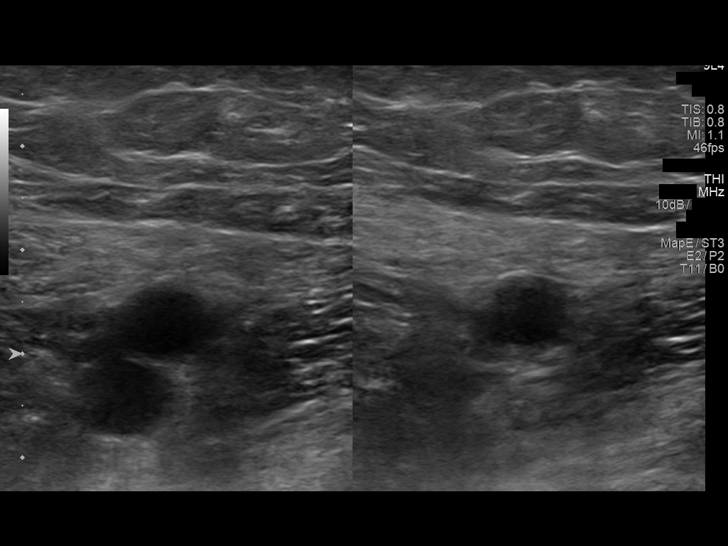
[im 32/73]
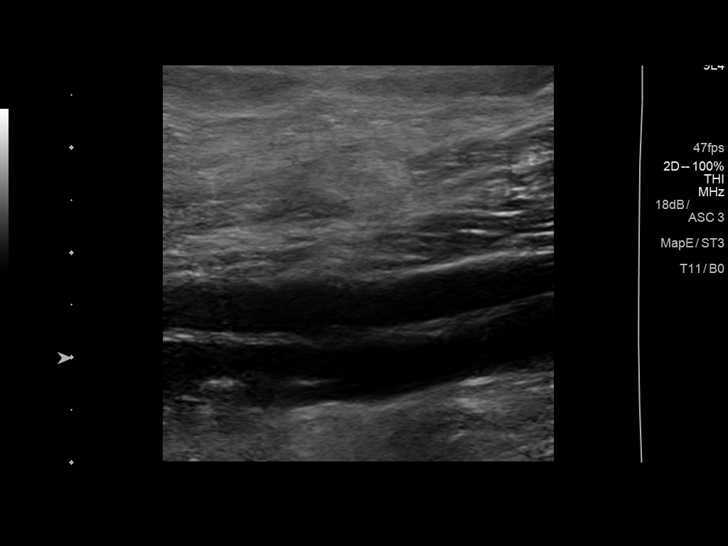
[im 38/73]
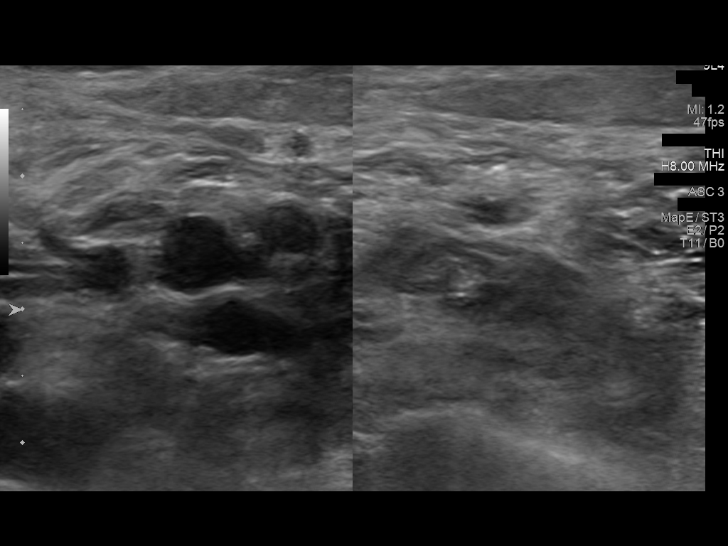
[im 41/73]
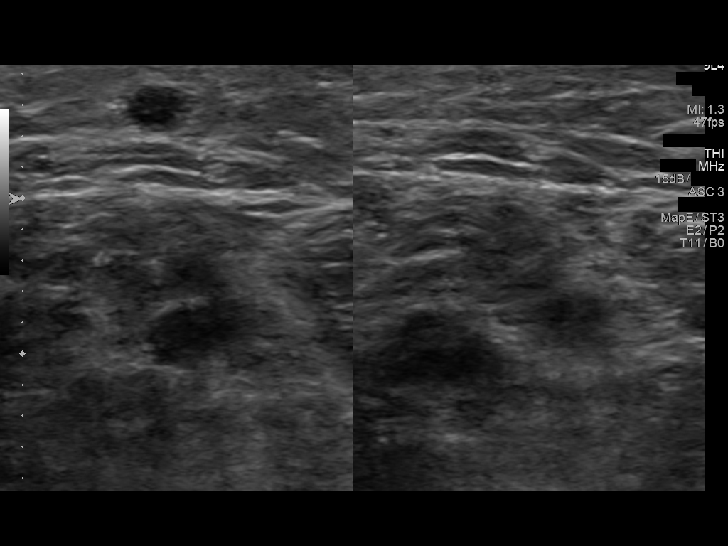
[im 47/73]
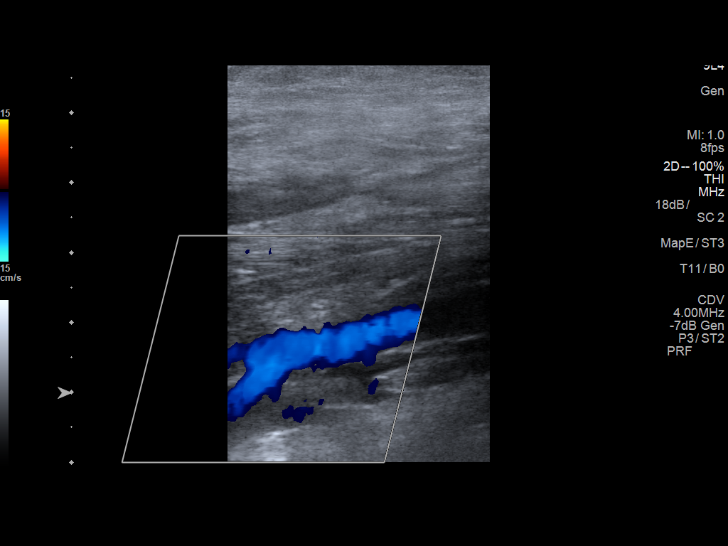
[im 54/73]
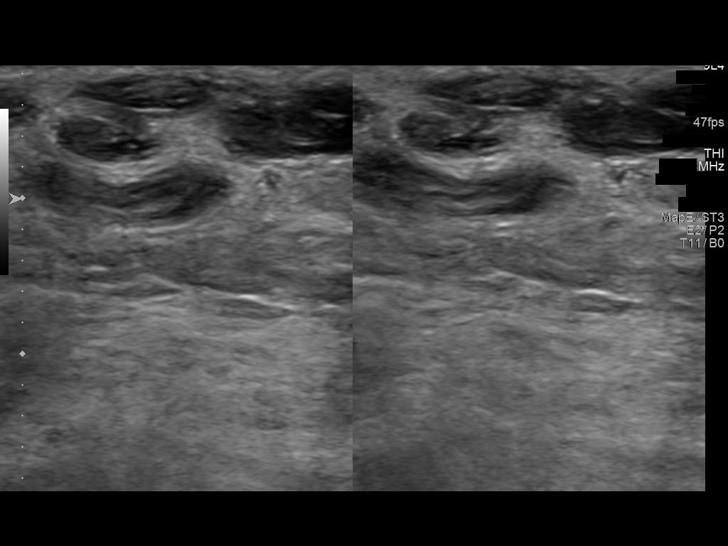
[im 60/73]
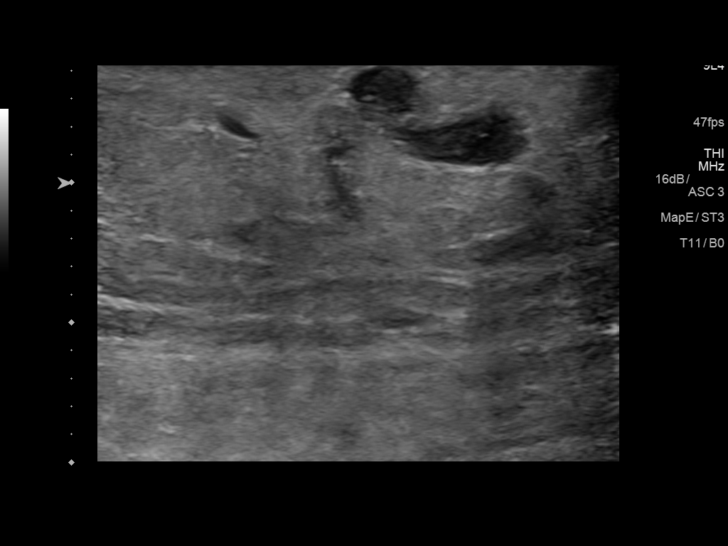
[im 66/73]
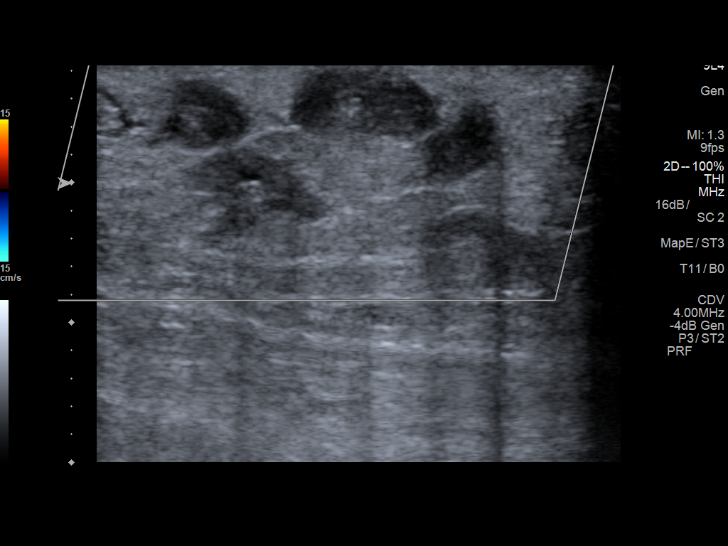
[im 73/73]
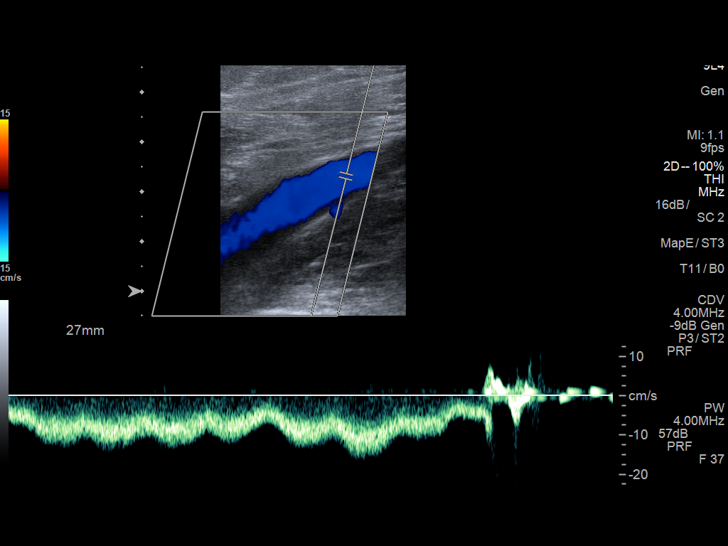

[13 of 24 positions shown; findings below may reference images not displayed]

FINDINGS: Contralateral Common Femoral Vein: Respiratory phasicity is normal
and symmetric with the symptomatic side. No evidence of thrombus.
Normal compressibility.

Common Femoral Vein: No evidence of thrombus. Normal
compressibility, respiratory phasicity and response to augmentation.

Saphenofemoral Junction: No evidence of thrombus. Normal
compressibility and flow on color Doppler imaging.

Profunda Femoral Vein: No evidence of thrombus. Normal
compressibility and flow on color Doppler imaging.

Femoral Vein: No evidence of thrombus. Normal compressibility,
respiratory phasicity and response to augmentation.

Popliteal Vein: No evidence of thrombus. Normal compressibility,
respiratory phasicity and response to augmentation.

Calf Veins: No evidence of thrombus. Normal compressibility and flow
on color Doppler imaging.

Superficial Great Saphenous Vein: No evidence of thrombus. Normal
compressibility and flow on color Doppler imaging.

Other Findings: Thrombosed varicosities of the thigh associated with
the great saphenous vein.
IMPRESSION: Sonographic survey of the right lower extremity negative for DVT.

Partially thrombosed venous varicosities of the thigh associated
with the superficial system, suggesting thrombophlebitis.

## 2018-02-06 ENCOUNTER — Ambulatory Visit (INDEPENDENT_AMBULATORY_CARE_PROVIDER_SITE_OTHER): Payer: Medicare Other | Admitting: Podiatry

## 2018-02-06 DIAGNOSIS — L600 Ingrowing nail: Secondary | ICD-10-CM

## 2018-02-06 DIAGNOSIS — B351 Tinea unguium: Secondary | ICD-10-CM | POA: Diagnosis not present

## 2018-02-06 DIAGNOSIS — M79609 Pain in unspecified limb: Secondary | ICD-10-CM | POA: Diagnosis not present

## 2018-02-07 ENCOUNTER — Ambulatory Visit: Payer: Medicare Other | Admitting: Podiatry

## 2018-02-12 NOTE — Progress Notes (Signed)
Subjective: 84 year old female presents the office today for concerns of chronic ingrown toenails as well as nail fungus to her nails.  She did previously see Dr. Marylene LandStover for this.  Other than that she had no other treatment for her nails.  The nails causing occasional discomfort but she denies any redness or drainage or any swelling.  She has no other concerns today.  No recent injury to her feet. Denies any systemic complaints such as fevers, chills, nausea, vomiting. No acute changes since last appointment, and no other complaints at this time.   Objective: AAO x3, NAD DP/PT pulses palpable 2/4 bilaterally, CRT less than 3 seconds Overall the nails are hypertrophic, dystrophic with yellow to brown discoloration.  There is mild incurvation present nails there is no edema, erythema, drainage or pus there is no clinical signs of infection present today.  No open lesions identified.  No other areas of tenderness. No pain with calf compression, swelling, warmth, erythema  Assessment: Onychomycosis chronic ingrown toenails  Plan: -All treatment options discussed with the patient including all alternatives, risks, complications.  -We discussed various treatment options.  She does not like to have a partial nail avulsion performed.  I did debride the nails x10 without any complications or bleeding if there are fungus and there are pain.  I did order a compound cream through [facility] for compound ointment for the nail fungus.  Discussed success rates and side effects and application use. -Patient encouraged to call the office with any questions, concerns, change in symptoms.   Kelsey BarrackMatthew R Bert Ptacek DPM

## 2018-02-22 DIAGNOSIS — L821 Other seborrheic keratosis: Secondary | ICD-10-CM | POA: Diagnosis not present

## 2018-02-23 ENCOUNTER — Encounter (HOSPITAL_COMMUNITY): Payer: Self-pay

## 2018-02-23 ENCOUNTER — Other Ambulatory Visit: Payer: Self-pay

## 2018-02-23 ENCOUNTER — Emergency Department (HOSPITAL_COMMUNITY)
Admission: EM | Admit: 2018-02-23 | Discharge: 2018-02-23 | Disposition: A | Discharge status: Home / Self Care | Payer: Medicare Other | Attending: Emergency Medicine | Admitting: Emergency Medicine

## 2018-02-23 DIAGNOSIS — I10 Essential (primary) hypertension: Secondary | ICD-10-CM | POA: Diagnosis present

## 2018-02-23 DIAGNOSIS — Z87891 Personal history of nicotine dependence: Secondary | ICD-10-CM | POA: Insufficient documentation

## 2018-02-23 DIAGNOSIS — Z79899 Other long term (current) drug therapy: Secondary | ICD-10-CM | POA: Diagnosis not present

## 2018-02-23 DIAGNOSIS — I150 Renovascular hypertension: Secondary | ICD-10-CM | POA: Insufficient documentation

## 2018-02-23 DIAGNOSIS — R000 Tachycardia, unspecified: Secondary | ICD-10-CM | POA: Diagnosis not present

## 2018-02-23 NOTE — Discharge Instructions (Addendum)
Keep a log of your blood pressure take it every afternoon and record the date.  Make sure you take your blood pressure medicine as prescribed.  Provide the log information to cardiology by calling them on Monday.  Return for any new or worse symptoms to include severe headache chest pain trouble breathing or any strokelike symptoms.

## 2018-02-23 NOTE — ED Triage Notes (Signed)
Pt reports went to dermatologist yesterday and was told her bp was high.  Pt says she checked it again this morning at home and it was 200/117.  Reports she took her medicine.  Pt reports is asymptomatic but has history of renal artery blockage.  Reports has had to have 2 ballooning procedures in the past.

## 2018-02-23 NOTE — ED Provider Notes (Signed)
[facility] EMERGENCY DEPARTMENT Provider Note   CSN: [id number] Arrival date & time: 02/23/18  9794     History   Chief Complaint No chief complaint on file.   HPI [name] is a 84 y.o. female.  Patient with longstanding history of hypertension.  It appears that from what she says and her notes that she has renal hypertension.  She had dilatation of her renal arteries in the past.  Patient has not been checking her blood pressure recently met may have been about 4 months ago when it was last checked she is not perfect with taking her blood pressure medicines every day however she did take them today and claims she took them yesterday.  Patient was at her dermatologist today and she was told that her systolic pressures were 801.  She checked it again today and she got 200/117 so she is concerned.  Patient without any symptoms no headache no shortness of breath no chest pain no strokelike symptoms.  Patient's blood pressure is predominantly now controlled by Dr. Raliegh Ip from cardiology.  Used to be followed by Dr. Mare Ferrari in the past.     Past Medical History:  Diagnosis Date  . Fibrocystic breast disease   . GERD (gastroesophageal reflux disease)   . H/O: hysterectomy   . Humerus fracture    right proximal  . Hypercholesterolemia   . Hypertension 1984   renal artery    Patient Active Problem List   Diagnosis Date Noted  . Osteoarthritis of knee 04/16/2017  . Contact dermatitis 10/26/2016  . S/P shoulder replacement, right 09/23/2016  . History of renal artery stenosis 08/26/2015  . History of multiple allergies 10/25/2013  . Benign hypertensive heart disease without heart failure 08/06/2010  . Hypercholesterolemia 08/06/2010    Past Surgical History:  Procedure Laterality Date  . ABDOMINAL HYSTERECTOMY    . BELPHAROPTOSIS REPAIR    . breast biopsies     for benign lumps  . BREAST LUMPECTOMY  1985  . CATARACT EXTRACTION W/PHACO Left 08/22/2016   Procedure: CATARACT  EXTRACTION PHACO AND INTRAOCULAR LENS PLACEMENT (IOC);  Surgeon: Tonny Branch, MD;  Location: [facility];  Service: Ophthalmology;  Laterality: Left;  CDE: 8.63  . CATARACT EXTRACTION W/PHACO Right 09/19/2016   Procedure: CATARACT EXTRACTION PHACO AND INTRAOCULAR LENS PLACEMENT (IOC);  Surgeon: Tonny Branch, MD;  Location: [facility];  Service: Ophthalmology;  Laterality: Right;  CDE: 9.96  . COLONOSCOPY    . RENAL ARTERY ANGIOPLASTY  1987   right Dr. Glennon Mac  . REVERSE SHOULDER ARTHROPLASTY Right 09/23/2016   Procedure: REVERSE SHOULDER ARTHROPLASTY;  Surgeon: Netta Cedars, MD;  Location: [place];  Service: Orthopedics;  Laterality: Right;  2 hrs  . TONSILLECTOMY    . TONSILLECTOMY    . TOTAL HIP ARTHROPLASTY     right  . VEIN LIGATION  1974   both legs  . WRIST FRACTURE SURGERY Right 2002  . WRIST FRACTURE SURGERY Left 2017     OB History   No obstetric history on file.      Home Medications    Prior to Admission medications   Medication Sig Start Date End Date Taking? Authorizing Provider  Ascorbic Acid (VITAMIN C) 100 MG tablet Take by mouth.    [provider]  Biotin 5000 MCG CAPS Take 5,000 mcg by mouth daily.    [provider]  clobetasol ointment (TEMOVATE) 0.05 % Apply topically. 05/08/17   [provider]  cyclobenzaprine (FLEXERIL) 5 MG tablet  04/19/10   [provider]  denosumab (PROLIA) 60 MG/ML SOSY injection Prolia 60 mg/mL subcutaneous syringe    [provider]  Desoximetasone (TOPICORT) 0.25 % ointment Apply to rash BID prn. Medically necessary that base be free of propylene glycol 06/30/16   [provider]  Emollient (CERAVE) LOTN Apply 1 application topically 3 (three) times daily as needed (itching).    [provider]  ezetimibe (ZETIA) 10 MG tablet TAKE ONE (1) TABLET EACH DAY 11/14/17   Herminio Commons, MD  fluconazole (DIFLUCAN) 150 MG tablet fluconazole 150 mg tablet    [provider]    fluocinolone (SYNALAR) 0.025 % ointment prn as needed 07/28/10   [provider]  hydrOXYzine (ATARAX/VISTARIL) 10 MG tablet hydroxyzine HCl 10 mg tablet    [provider]  losartan-hydrochlorothiazide (HYZAAR) 100-25 MG tablet TAKE ONE (1) TABLET BY MOUTH EVERY DAY 09/29/17   Herminio Commons, MD  metoprolol succinate (TOPROL-XL) 25 MG 24 hr tablet Take 1 tablet (25 mg total) by mouth daily. 12/21/15   Herminio Commons, MD  nadolol (CORGARD) 40 MG tablet  04/16/10   [provider]  NON FORMULARY [facility]: Terbinafine 3%,Fluconazole 2%, Tea Tree Oil 5%, Urea 10%,Ibuprofen 2% in DMSO    [provider]  omeprazole (PRILOSEC OTC) 20 MG tablet Take 20 mg by mouth daily.    [provider]  potassium chloride (K-DUR) 10 MEQ tablet TAKE ONE (1) TABLET BY MOUTH EVERY DAY 05/19/17   Herminio Commons, MD  temazepam (RESTORIL) 15 MG capsule Take 15-30 mg by mouth at bedtime as needed for sleep (sleep). Reported on 08/26/2015    [provider]    Family History Family History  Problem Relation Age of Onset  . Multiple myeloma Father     Social History Social History   Tobacco Use  . Smoking status: Former Smoker    Last attempt to quit: 03/15/1987    Years since quitting: 30.9  . Smokeless tobacco: Never Used  Substance Use Topics  . Alcohol use: Yes    Alcohol/week: 0.0 standard drinks    Comment: occasional  . Drug use: No     Allergies   Baycol [cerivastatin sodium]; Crestor [rosuvastatin calcium]; Lipitor [atorvastatin calcium]; Morphine and related; Pravachol; Propylene glycol; Balsam peru-castor [amberderm]; Ciprofloxacin; Diphenhydramine hcl; Methyldibromoglutaronitrile; and Other   Review of Systems Review of Systems  Constitutional: Negative for fever.  Eyes: Negative for visual disturbance.  Respiratory: Negative for shortness of breath.   Cardiovascular: Negative for chest pain.  Gastrointestinal:  Negative for abdominal pain, nausea and vomiting.  Genitourinary: Negative for dysuria.  Musculoskeletal: Negative for back pain.  Skin: Negative for rash.  Neurological: Negative for dizziness, syncope, speech difficulty, weakness, numbness and headaches.  Hematological: Does not bruise/bleed easily.  Psychiatric/Behavioral: Negative for confusion.     Physical Exam Updated Vital Signs BP (!) 153/84   Pulse 69   Temp 98.2 F (36.8 C) (Oral)   Resp 13   Ht 1.575 m ('5\' 2"'$ )   Wt 70.3 kg   SpO2 96%   BMI 28.35 kg/m   Physical Exam Vitals signs and nursing note reviewed.  Constitutional:      General: She is not in acute distress.    Appearance: Normal appearance. She is not ill-appearing or toxic-appearing.  HENT:     Head: Normocephalic and atraumatic.     Mouth/Throat:     Mouth: Mucous membranes are moist.  Eyes:  Extraocular Movements: Extraocular movements intact.     Conjunctiva/sclera: Conjunctivae normal.     Pupils: Pupils are equal, round, and reactive to light.  Neck:     Musculoskeletal: Neck supple.  Cardiovascular:     Rate and Rhythm: Regular rhythm. Tachycardia present.     Heart sounds: No murmur.  Pulmonary:     Effort: Pulmonary effort is normal. No respiratory distress.     Breath sounds: Normal breath sounds.  Abdominal:     General: Abdomen is flat. Bowel sounds are normal.     Palpations: Abdomen is soft.     Tenderness: There is no abdominal tenderness.     Comments: No bruit heard over the renal arteries.  Skin:    General: Skin is warm.     Capillary Refill: Capillary refill takes less than 2 seconds.     Findings: No rash.  Neurological:     General: No focal deficit present.     Mental Status: She is alert and oriented to person, place, and time.      ED Treatments / Results  Labs (all labs ordered are listed, but only abnormal results are displayed) Labs Reviewed - No data to display  EKG EKG  Interpretation  Date/Time:  Friday February 23 2018 08:08:47 EST Ventricular Rate:  79 PR Interval:    QRS Duration: 95 QT Interval:  375 QTC Calculation: 430 R Axis:   -29 Text Interpretation:  Sinus rhythm Low voltage, precordial leads Abnormal R-wave progression, late transition Probable left ventricular hypertrophy Borderline T abnormalities, anterior leads Baseline wander in lead(s) V2 Confirmed by Fredia Sorrow [phone]) on 02/23/2018 8:26:56 AM   Radiology No results found.  Procedures Procedures (including critical care time)  Medications Ordered in ED Medications - No data to display   Initial Impression / Assessment and Plan / ED Course  I have reviewed the triage vital signs and the nursing notes.  Pertinent labs & imaging results that were available during my care of the patient were reviewed by me and considered in my medical decision making (see chart for details).   Patient had taken her blood pressures morning either right around the time she took her blood pressure medicine or shortly thereafter.  Her blood pressure showed signs of improvement here.  Shortly after arrival here her systolic was in the 867E.  Then it came down into the upper 140s.  Feel that patient can keep a blood pressure log follow-up with her cardiologist at least by phone on Monday.  She will start a daily log to check on her blood pressure in the afternoon make sure she takes her blood pressure medicine.  Patient will return for any significant symptoms to include headache strokelike symptoms chest pain or trouble breathing.  Do not feel that patient warrants based on today's presentation referral back to vascular surgery for consideration for ballooning.  Feel that patient needs to get a trend on her blood pressures.  Patient showing no signs of any endorgan dysfunction clinically.    Final Clinical Impressions(s) / ED Diagnoses   Final diagnoses:  Renovascular hypertension    ED  Discharge Orders    None       Fredia Sorrow, MD 02/23/18 1614

## 2018-03-01 ENCOUNTER — Other Ambulatory Visit: Payer: Self-pay | Admitting: Cardiovascular Disease

## 2018-03-02 ENCOUNTER — Other Ambulatory Visit: Payer: Self-pay | Admitting: Cardiovascular Disease

## 2018-03-02 MED ORDER — LOSARTAN POTASSIUM 100 MG PO TABS: 100.0000 mg | ORAL_TABLET | Freq: Every day | ORAL | 3 refills | Status: DC

## 2018-03-02 MED ORDER — HYDROCHLOROTHIAZIDE 25 MG PO TABS: 25.0000 mg | ORAL_TABLET | Freq: Every day | ORAL | 3 refills | Status: DC

## 2018-03-02 NOTE — Telephone Encounter (Signed)
Please call [facility] concerning pt's losartan-hydrochlorothiazide (HYZAAR) 100-25 MG tablet [[id number]][[id number]]

## 2018-03-02 NOTE — Telephone Encounter (Signed)
Returned call to [place] @ [facility]. He needs a separate script for hyzzar.( needs to be sent in separate)

## 2018-03-09 ENCOUNTER — Telehealth: Payer: Self-pay | Admitting: Cardiovascular Disease

## 2018-03-09 NOTE — Telephone Encounter (Signed)
Pt was seen in ED 02/23/18 for elevated BP  Is following daily BP's  Starting 02/23/18 ending 03/08/18  163/88 178/94 173/83 141/81 154/81 176/83 180/93 143/82 160/81    Has apt with B Strader PA-C on 03/30/2018   I will FYI Dr.Koneswaran

## 2018-03-09 NOTE — Telephone Encounter (Signed)
Patient would like to speak with nurse regarding BP readings./ tg  °

## 2018-03-13 MED ORDER — CARVEDILOL 3.125 MG PO TABS: 3.1250 mg | ORAL_TABLET | Freq: Two times a day (BID) | ORAL | 3 refills | Status: DC

## 2018-03-13 NOTE — Telephone Encounter (Signed)
[id number] 

## 2018-03-13 NOTE — Telephone Encounter (Signed)
Switch Toprol-XL to carvedilol 3.125 mg twice daily.

## 2018-03-13 NOTE — Telephone Encounter (Signed)
Pt will stop Toprol, will begin Coreg 3.125 mg BID

## 2018-03-20 ENCOUNTER — Ambulatory Visit: Payer: Medicare Other | Admitting: Student

## 2018-03-30 ENCOUNTER — Encounter: Payer: Self-pay | Admitting: Student

## 2018-03-30 ENCOUNTER — Ambulatory Visit (INDEPENDENT_AMBULATORY_CARE_PROVIDER_SITE_OTHER): Payer: Medicare Other | Admitting: Student

## 2018-03-30 VITALS — BP 118/64 | HR 83 | Ht 62.0 in | Wt 167.8 lb

## 2018-03-30 DIAGNOSIS — Z8679 Personal history of other diseases of the circulatory system: Secondary | ICD-10-CM | POA: Diagnosis not present

## 2018-03-30 DIAGNOSIS — E785 Hyperlipidemia, unspecified: Secondary | ICD-10-CM

## 2018-03-30 DIAGNOSIS — I10 Essential (primary) hypertension: Secondary | ICD-10-CM | POA: Diagnosis not present

## 2018-03-30 NOTE — Patient Instructions (Signed)
Medication Instructions:  Your physician recommends that you continue on your current medications as directed. Please refer to the Current Medication list given to you today.  If you need a refill on your cardiac medications before your next appointment, please call your pharmacy.   Lab work: NONE  If you have labs (blood work) drawn today and your tests are completely normal, you will receive your results only by: . MyChart Message (if you have MyChart) OR . A paper copy in the mail If you have any lab test that is abnormal or we need to change your treatment, we will call you to review the results.  Testing/Procedures: NONE   Follow-Up: At [facility], you and your health needs are our priority.  As part of our continuing mission to provide you with exceptional heart care, we have created designated Provider Care Teams.  These Care Teams include your primary Cardiologist (physician) and Advanced Practice Providers (APPs -  Physician Assistants and Nurse Practitioners) who all work together to provide you with the care you need, when you need it. You will need a follow up appointment in 1 years.  Please call our office 2 months in advance to schedule this appointment.  You may see Suresh Koneswaran, MD or one of the following Advanced Practice Providers on your designated Care Team:   Brittany Strader, PA-C ([place] [employer] Office) . Michele Lenze, PA-C ([place] Office)  Any Other Special Instructions Will Be Listed Below (If Applicable). Thank you for choosing [facility]!     

## 2018-03-30 NOTE — Progress Notes (Signed)
Cardiology Office Note    Date:  03/30/2018   ID:  [name], DOB 1933/10/01, MRN [id number]  PCP:  Sinda Du, MD  Cardiologist: Kate Sable, MD    Chief Complaint  Patient presents with  . Follow-up    recent Emergency Dept visit    History of Present Illness:    [name] is a 85 y.o. female with past medical history of renal artery stenosis (s/p bilateral angioplasty in the 1980's), HTN, and HLD who presents to the office today for follow-up from a recent Emergency Department visit.  She was last examined by Dr. Bronson Ing in 08/2017 and reported having not taken her blood pressure medications for the past 3 days prior to her visit and BP was elevated to 146/84. Was continued on her current medication regimen at that time including Losartan-HCTZ 100-75m daily and Toprol-XL 236mdaily.   [facility] for elevated BP. Reported this had been elevated to 200/117 when checked at home. Was improved to 153/84 while in the ED and she was encouraged to keep a BP log and follow-up with Cardiology.  She called the office several weeks later reporting BPs remained elevated, therefore it was recommended to switch Toprol-XL to Coreg 3.125 mg twice daily.  In talking with the patient today, she tells me that she had actually not taken her blood pressure medications for 2 to 3 days leading up to the ED visit in 02/2018. She has since been compliant with her medications and reports SBP has ranged in the 130's to 150's when checked at home. BP is at 118/64 during today's visit. Does note that her home blood pressure cuff is 2033years old.  She denies any recent chest pain, dyspnea on exertion, orthopnea, PND, or lower extremity edema.  No reported side effects to any of her medications. She is listed as being on both Nadolol and Carvedilol but says she has not taken Nadolol in several years.  Past Medical History:  Diagnosis Date  . Fibrocystic  breast disease   . GERD (gastroesophageal reflux disease)   . H/O: hysterectomy   . Humerus fracture    right proximal  . Hypercholesterolemia   . Hypertension 1984   renal artery    Past Surgical History:  Procedure Laterality Date  . ABDOMINAL HYSTERECTOMY    . BELPHAROPTOSIS REPAIR    . breast biopsies     for benign lumps  . BREAST LUMPECTOMY  1985  . CATARACT EXTRACTION W/PHACO Left 08/22/2016   Procedure: CATARACT EXTRACTION PHACO AND INTRAOCULAR LENS PLACEMENT (IOC);  Surgeon: HuTonny BranchMD;  Location: [facility];  Service: Ophthalmology;  Laterality: Left;  CDE: 8.63  . CATARACT EXTRACTION W/PHACO Right 09/19/2016   Procedure: CATARACT EXTRACTION PHACO AND INTRAOCULAR LENS PLACEMENT (IOC);  Surgeon: HuTonny BranchMD;  Location: [facility];  Service: Ophthalmology;  Laterality: Right;  CDE: 9.96  . COLONOSCOPY    . RENAL ARTERY ANGIOPLASTY  1987   right Dr. JaGlennon Mac. REVERSE SHOULDER ARTHROPLASTY Right 09/23/2016   Procedure: REVERSE SHOULDER ARTHROPLASTY;  Surgeon: NoNetta CedarsMD;  [facility];  Laterality: Right;  2 hrs  . TONSILLECTOMY    . TONSILLECTOMY    . TOTAL HIP ARTHROPLASTY     right  . VEIN LIGATION  1974   both legs  . WRIST FRACTURE SURGERY Right 2002  . WRIST FRACTURE SURGERY Left 2017    Current Medications: Outpatient Medications  Prior to Visit  Medication Sig Dispense Refill  . Ascorbic Acid (VITAMIN C) 100 MG tablet Take by mouth.    . Biotin 5000 MCG CAPS Take 5,000 mcg by mouth daily.    . carvedilol (COREG) 3.125 MG tablet Take 1 tablet (3.125 mg total) by mouth 2 (two) times daily with a meal. 90 tablet 3  . clobetasol ointment (TEMOVATE) 0.05 % Apply topically.    Marland Kitchen denosumab (PROLIA) 60 MG/ML SOSY injection Prolia 60 mg/mL subcutaneous syringe    . Desoximetasone (TOPICORT) 0.25 % ointment Apply to rash BID prn. Medically necessary that base be free of propylene glycol    . Emollient (CERAVE) LOTN Apply 1 application  topically 3 (three) times daily as needed (itching).    . ezetimibe (ZETIA) 10 MG tablet TAKE ONE (1) TABLET EACH DAY 90 tablet 3  . fluocinolone (SYNALAR) 0.025 % ointment prn as needed    . hydrochlorothiazide (HYDRODIURIL) 25 MG tablet Take 1 tablet (25 mg total) by mouth daily. 90 tablet 3  . hydrOXYzine (ATARAX/VISTARIL) 10 MG tablet hydroxyzine HCl 10 mg tablet    . losartan (COZAAR) 100 MG tablet Take 1 tablet (100 mg total) by mouth daily. 90 tablet 3  . NON FORMULARY [facility]: Terbinafine 3%,Fluconazole 2%, Tea Tree Oil 5%, Urea 10%,Ibuprofen 2% in DMSO    . omeprazole (PRILOSEC OTC) 20 MG tablet Take 20 mg by mouth daily.    . potassium chloride (K-DUR) 10 MEQ tablet TAKE ONE (1) TABLET BY MOUTH EVERY DAY 90 tablet 1  . temazepam (RESTORIL) 15 MG capsule Take 15-30 mg by mouth at bedtime as needed for sleep (sleep). Reported on 08/26/2015    . cyclobenzaprine (FLEXERIL) 5 MG tablet     . fluconazole (DIFLUCAN) 150 MG tablet fluconazole 150 mg tablet    . nadolol (CORGARD) 40 MG tablet      No facility-administered medications prior to visit.      Allergies:   Baycol [cerivastatin sodium]; Crestor [rosuvastatin calcium]; Lipitor [atorvastatin calcium]; Morphine and related; Pravachol; Propylene glycol; Balsam peru-castor [amberderm]; Ciprofloxacin; Diphenhydramine hcl; Methyldibromoglutaronitrile; and Other   Social History   Socioeconomic History  . Marital status: Married    Spouse name: Not on file  . Number of children: Not on file  . Years of education: Not on file  . Highest education level: Not on file  Occupational History  . Not on file  Social Needs  . Financial resource strain: Not on file  . Food insecurity:    Worry: Not on file    Inability: Not on file  . Transportation needs:    Medical: Not on file    Non-medical: Not on file  Tobacco Use  . Smoking status: Former Smoker    Last attempt to quit: 03/15/1987    Years since quitting: 31.0  .  Smokeless tobacco: Never Used  Substance and Sexual Activity  . Alcohol use: Not Currently    Alcohol/week: 0.0 standard drinks    Comment: occasional  . Drug use: No  . Sexual activity: Not on file  Lifestyle  . Physical activity:    Days per week: Not on file    Minutes per session: Not on file  . Stress: Not on file  Relationships  . Social connections:    Talks on phone: Not on file    Gets together: Not on file    Attends religious service: Not on file    Active member of club or organization: Not on  file    Attends meetings of clubs or organizations: Not on file    Relationship status: Not on file  Other Topics Concern  . Not on file  Social History Narrative  . Not on file     Family History:  The patient's family history includes Multiple myeloma in her father.   Review of Systems:   Please see the history of present illness.     General:  No chills, fever, night sweats or weight changes.  Cardiovascular:  No chest pain, dyspnea on exertion, edema, orthopnea, palpitations, paroxysmal nocturnal dyspnea. Dermatological: No rash, lesions/masses Respiratory: No cough, dyspnea Urologic: No hematuria, dysuria Abdominal:   No nausea, vomiting, diarrhea, bright red blood per rectum, melena, or hematemesis Neurologic:  No visual changes, wkns, changes in mental status.  She denies any of the above symptoms.   All other systems reviewed and are otherwise negative except as noted above.   Physical Exam:    VS:  BP 118/64   Pulse 83   Ht _0  (1.575 m)   Wt 167 lb 12.8 oz (76.1 kg)   SpO2 97%   BMI 30.69 kg/m    General: Well developed, elderly Caucasian female appearing in no acute distress. Head: Normocephalic, atraumatic, sclera non-icteric, no xanthomas, nares are without discharge.  Neck: No carotid bruits. JVD not elevated.  Lungs: Respirations regular and unlabored, without wheezes or rales.  Heart: Regular rate and rhythm. No S3 or S4.  No murmur, no  rubs, or gallops appreciated. Abdomen: Soft, non-tender, non-distended with normoactive bowel sounds. No hepatomegaly. No rebound/guarding. No obvious abdominal masses. Msk:  Strength and tone appear normal for age. No joint deformities or effusions. Extremities: No clubbing or cyanosis. No lower extremity edema.  Distal pedal pulses are 2+ bilaterally. Neuro: Alert and oriented X 3. Moves all extremities spontaneously. No focal deficits noted. Psych:  Responds to questions appropriately with a normal affect. Skin: No rashes or lesions noted  Wt Readings from Last 3 Encounters:  03/30/18 167 lb 12.8 oz (76.1 kg)  02/23/18 155 lb (70.3 kg)  11/09/17 155 lb (70.3 kg)     Studies/Labs Reviewed:   EKG:  EKG is not ordered today.     Recent Labs: No results found for requested labs within last 8760 hours.   Lipid Panel    Component Value Date/Time   CHOL 179 10/26/2015 0959   TRIG 113 10/26/2015 0959   HDL 49 10/26/2015 0959   CHOLHDL 3.7 10/26/2015 0959   VLDL 23 10/26/2015 0959   LDLCALC 107 10/26/2015 0959    Additional studies/ records that were reviewed today include:   EKG: 02/23/2018: NSR, HR 79, with no acute ST changes when compared to prior tracings.   Assessment:    1. Essential hypertension   2. History of renal artery stenosis   3. Hyperlipidemia, unspecified hyperlipidemia type      Plan:   In order of problems listed above:  1. HTN - Recently evaluated in the ED for elevated BP but the patient tells me today she had not taken her medications for 2 to 3 days leading up to her ED visit. Readings have improved since being compliant with her medications and with being switched from Toprol-XL to Coreg. She is currently on Carvedilol 3.125 mg twice daily, HCTZ 25 mg daily, and Losartan 100 mg daily with BP well-controlled at 118/64 during today's visit. She plans to purchase a new BP cuff for home use as her current one is  6+ years old. Pending BP readings over  the next several weeks, could consider further titration of Coreg to 6.10m BID but given her well-controlled readings, will continue her current regimen at this time.   2. History of Renal Artery Stenosis - s/p bilateral angioplasty in the 1980's. She has experienced labile hypertension within the past month but this has been in the setting of medication noncompliance. She is now compliant with her current regimen and BP has improved as outlined above. If she develops resistant HTN despite compliance with her medications, would consider obtaining a renal artery duplex in the future. This was reviewed with the patient today.  3. HLD - Followed by PCP. She has been intolerant to multiple statins and remains on Zetia 10 mg daily.   Medication Adjustments/Labs and Tests Ordered: Current medicines are reviewed at length with the patient today.  Concerns regarding medicines are outlined above.  Medication changes, Labs and Tests ordered today are listed in the Patient Instructions below. Patient Instructions  Medication Instructions:  Your physician recommends that you continue on your current medications as directed. Please refer to the Current Medication list given to you today.  If you need a refill on your cardiac medications before your next appointment, please call your pharmacy.   Lab work: NONE  If you have labs (blood work) drawn today and your tests are completely normal, you will receive your results only by: .Marland KitchenMyChart Message (if you have MyChart) OR . A paper copy in the mail If you have any lab test that is abnormal or we need to change your treatment, we will call you to review the results.  Testing/Procedures: NONE   Follow-Up: [facility] and your [facility] needs are our priority.  As part of our continuing mission to provide you with exceptional heart care, we have created designated Provider Care Teams.  These Care Teams include your primary Cardiologist (physician)  and Advanced Practice Providers (APPs -  Physician Assistants and Nurse Practitioners) who all work together to provide you with the care you need, when you need it. You will need a follow up appointment in 1 years.  Please call our office 2 months in advance to schedule this appointment.  You may see SKate Sable MD or one of the following Advanced Practice Providers on your designated Care Team:   BBernerd Pho PA-C ([facility] . MErmalinda Barrios PA-C ([place]  Any Other Special Instructions Will Be Listed Below (If Applicable). Thank you for choosing [facility]     Signed, BErma Heritage PA-C  03/30/2018 3:24 PM     [facility] [address] [place] [id number]: [phone]: [phone]

## 2018-04-02 ENCOUNTER — Telehealth: Payer: Self-pay | Admitting: Cardiovascular Disease

## 2018-04-02 NOTE — Telephone Encounter (Signed)
Can not due Thursday after noons

## 2018-04-02 NOTE — Telephone Encounter (Signed)
lmtcb-cc made nurse apt for Thursday, 1/23 at 4 pm

## 2018-04-02 NOTE — Telephone Encounter (Signed)
Would like to know when she can come by and get her BP machine checked out

## 2018-04-03 NOTE — Telephone Encounter (Signed)
LM FOR PT TO COME TUES 1/28 AT 3:30 PM

## 2018-04-05 ENCOUNTER — Ambulatory Visit: Payer: Medicare Other

## 2018-04-09 ENCOUNTER — Telehealth: Payer: Self-pay | Admitting: Cardiovascular Disease

## 2018-04-09 NOTE — Telephone Encounter (Signed)
Pt got a new BP machine and her BP is reading 155/85 and she wants to know if she needs a medication change before she orders her Rx's   Can be reached @ [phone]

## 2018-04-10 ENCOUNTER — Ambulatory Visit: Payer: Medicare Other

## 2018-04-10 NOTE — Telephone Encounter (Signed)
Pt notified that Dr.Koneswaran has her BP log and we will call with any med changes

## 2018-04-10 NOTE — Telephone Encounter (Signed)
I left message for patient to call me back with more BP readings if she has them.Then I will forward message to Dr.Koneswaran

## 2018-04-11 ENCOUNTER — Ambulatory Visit (INDEPENDENT_AMBULATORY_CARE_PROVIDER_SITE_OTHER): Payer: Medicare Other

## 2018-04-11 ENCOUNTER — Ambulatory Visit (INDEPENDENT_AMBULATORY_CARE_PROVIDER_SITE_OTHER): Payer: Medicare Other | Admitting: Podiatry

## 2018-04-11 DIAGNOSIS — S92501A Displaced unspecified fracture of right lesser toe(s), initial encounter for closed fracture: Secondary | ICD-10-CM | POA: Diagnosis not present

## 2018-04-11 DIAGNOSIS — B351 Tinea unguium: Secondary | ICD-10-CM

## 2018-04-11 DIAGNOSIS — M79609 Pain in unspecified limb: Secondary | ICD-10-CM

## 2018-04-11 NOTE — Progress Notes (Signed)
Subjective: [name] presents today with painful, thick toenails 1-5 b/l that she cannot cut and which interfere with daily activities.  Pain is aggravated when wearing enclosed shoe gear.  Patient relates a traumatic incident where she hit her right fifth digit.  She states she had it a while ago and she feels like it is getting better.  She does still have some residual swelling and pain.  She has been wearing house slippers for comfort.  She is not sure whether or not she needs an x-ray today.  She is continuing to use the compounded antifungal medication prescribed from [place].  Kari Baars, MD is her PCP.   Current Outpatient Medications:  .  Ascorbic Acid (VITAMIN C) 100 MG tablet, Take by mouth., Disp: , Rfl:  .  Biotin 5000 MCG CAPS, Take 5,000 mcg by mouth daily., Disp: , Rfl:  .  carvedilol (COREG) 3.125 MG tablet, Take 1 tablet (3.125 mg total) by mouth 2 (two) times daily with a meal., Disp: 90 tablet, Rfl: 3 .  clobetasol ointment (TEMOVATE) 0.05 %, Apply topically., Disp: , Rfl:  .  denosumab (PROLIA) 60 MG/ML SOSY injection, Prolia 60 mg/mL subcutaneous syringe, Disp: , Rfl:  .  Desoximetasone (TOPICORT) 0.25 % ointment, Apply to rash BID prn. Medically necessary that base be free of propylene glycol, Disp: , Rfl:  .  Emollient (CERAVE) LOTN, Apply 1 application topically 3 (three) times daily as needed (itching)., Disp: , Rfl:  .  ezetimibe (ZETIA) 10 MG tablet, TAKE ONE (1) TABLET EACH DAY, Disp: 90 tablet, Rfl: 3 .  fluocinolone (SYNALAR) 0.025 % ointment, prn as needed, Disp: , Rfl:  .  hydrochlorothiazide (HYDRODIURIL) 25 MG tablet, Take 1 tablet (25 mg total) by mouth daily., Disp: 90 tablet, Rfl: 3 .  hydrOXYzine (ATARAX/VISTARIL) 10 MG tablet, hydroxyzine HCl 10 mg tablet, Disp: , Rfl:  .  losartan (COZAAR) 100 MG tablet, Take 1 tablet (100 mg total) by mouth daily., Disp: 90 tablet, Rfl: 3 .  NON FORMULARY, [place] [facility]: Terbinafine  3%,Fluconazole 2%, Tea Tree Oil 5%, Urea 10%,Ibuprofen 2% in DMSO, Disp: , Rfl:  .  omeprazole (PRILOSEC OTC) 20 MG tablet, Take 20 mg by mouth daily., Disp: , Rfl:  .  potassium chloride (K-DUR) 10 MEQ tablet, TAKE ONE (1) TABLET BY MOUTH EVERY DAY, Disp: 90 tablet, Rfl: 1 .  temazepam (RESTORIL) 15 MG capsule, Take 15-30 mg by mouth at bedtime as needed for sleep (sleep). Reported on 08/26/2015, Disp: , Rfl:   Allergies  Allergen Reactions  . Baycol [Cerivastatin Sodium] Itching  . Crestor [Rosuvastatin Calcium] Itching  . Lipitor [Atorvastatin Calcium] Itching  . Morphine And Related Other (See Comments)    Hallucinations , skin crawling, "feels like elephants are crawling"  . Pravachol Other (See Comments)    Pt doesn't recall reaction  . Propylene Glycol Itching    Severe itching  . Balsam [place]-Castor [Amberderm] Itching and Rash  . Ciprofloxacin Hives and Rash  . Diphenhydramine Hcl Rash  . Methyldibromoglutaronitrile Rash    Per patch testing  . Other Itching and Rash    Botanicals , Fragrance , methyldibromo glutaronitrile, detergents can only use "All free and clear"    Objective:  Vascular Examination: Capillary refill time less than 3 seconds x 10 digits  Dorsalis pedis and Posterior tibial pulses 2/4 b/l  Digital hair present x 10 digits  Skin temperature gradient WNL b/l  Dermatological Examination: Skin with normal turgor, texture and tone b/l  Toenails 1-5 b/l discolored, thick, dystrophic with subungual debris and pain with palpation to nailbeds due to thickness of nails.  Musculoskeletal: Muscle strength 5/5 to all LE muscle groups  Right fifth digit is noted to be mildly swollen with mild erythema.  It is tender to palpation.  Moderate discomfort with active and passive range of motion.  No gross bony deformities b/l.  Neurological: Sensation intact with 10 gram monofilament. Vibratory sensation intact.  X-ray right foot: Noted small cortical  fracture at the base of the proximal phalanx.  There is no displacement.  Assessment: Painful onychomycosis toenails 1-5 b/l  Fracture proximal phalanx right fifth toe  Plan: 1. Toenails 1-5 b/l were debrided in length and girth without iatrogenic bleeding. 2. X-rays performed right foot.  Discussed fracture of proximal phalanx of the right fifth digit.  Patient given instructions on how to blunt buddy splint the digit to the fourth digit.  She related understanding.  This happened some weeks ago and she is feels like she is getting better so this should be self-limiting.  She is to get back in her shoe gear when it is not painful.  She is to call the office should condition worsen. 3. Patient to continue soft, supportive shoe gear. 4. Patient to report any pedal injuries to medical professional immediately. 5. Follow up 3 months for her routine foot care.  6. Patient/POA to call should there be a concern in the interim.

## 2018-04-12 ENCOUNTER — Telehealth: Payer: Self-pay

## 2018-04-12 DIAGNOSIS — I773 Arterial fibromuscular dysplasia: Secondary | ICD-10-CM | POA: Diagnosis not present

## 2018-04-12 DIAGNOSIS — I10 Essential (primary) hypertension: Secondary | ICD-10-CM | POA: Diagnosis not present

## 2018-04-12 DIAGNOSIS — K210 Gastro-esophageal reflux disease with esophagitis: Secondary | ICD-10-CM | POA: Diagnosis not present

## 2018-04-12 DIAGNOSIS — M810 Age-related osteoporosis without current pathological fracture: Secondary | ICD-10-CM | POA: Diagnosis not present

## 2018-04-12 MED ORDER — CARVEDILOL 6.25 MG PO TABS: 6.2500 mg | ORAL_TABLET | Freq: Two times a day (BID) | ORAL | 3 refills | Status: DC

## 2018-04-12 NOTE — Telephone Encounter (Signed)
Called pt. No answer. Left message for pt to return call. After reviewing pt'a blood pressure monitor, Dr. Purvis Sheffield increased the pt's coreg to 6.25 mg two times daily. Sent RX in to [facility].

## 2018-04-18 ENCOUNTER — Encounter: Payer: Self-pay | Admitting: Podiatry

## 2018-04-19 ENCOUNTER — Other Ambulatory Visit (HOSPITAL_COMMUNITY): Payer: Medicare Other

## 2018-04-19 ENCOUNTER — Encounter (HOSPITAL_COMMUNITY): Admission: RE | Admit: 2018-04-19 | Payer: Medicare Other | Source: Ambulatory Visit

## 2018-04-23 ENCOUNTER — Telehealth: Payer: Self-pay | Admitting: Cardiovascular Disease

## 2018-04-23 MED ORDER — CARVEDILOL 12.5 MG PO TABS: 12.5000 mg | ORAL_TABLET | Freq: Two times a day (BID) | ORAL | 6 refills | Status: DC

## 2018-04-23 NOTE — Telephone Encounter (Signed)
Pt will increase coreg to 12.5 mg BID and call back in 10 days with BP readings

## 2018-04-23 NOTE — Telephone Encounter (Signed)
Would like to speak w/ the nurse concerning BP [phone]

## 2018-04-23 NOTE — Telephone Encounter (Signed)
Increase to 12.5mg bid

## 2018-04-23 NOTE — Telephone Encounter (Signed)
Pt had Coreg increase to 6.25 mg BID on 04/12/2018.Calls today to say BP readings are 144/80, 170/899, 160/87 Her HR ranges 67-80

## 2018-04-25 ENCOUNTER — Encounter (HOSPITAL_COMMUNITY): Payer: Self-pay

## 2018-04-25 ENCOUNTER — Encounter (HOSPITAL_COMMUNITY)
Admission: RE | Admit: 2018-04-25 | Discharge: 2018-04-25 | Disposition: A | Discharge status: Home / Self Care | Payer: Medicare Other | Source: Ambulatory Visit | Attending: Pulmonary Disease | Admitting: Pulmonary Disease

## 2018-04-25 DIAGNOSIS — M810 Age-related osteoporosis without current pathological fracture: Secondary | ICD-10-CM | POA: Insufficient documentation

## 2018-04-25 MED ORDER — DENOSUMAB 60 MG/ML ~~LOC~~ SOSY
60.0000 mg | PREFILLED_SYRINGE | Freq: Once | SUBCUTANEOUS | Status: AC
  Administered 2018-04-25: 60 mg via SUBCUTANEOUS

## 2018-05-01 DIAGNOSIS — Z1231 Encounter for screening mammogram for malignant neoplasm of breast: Secondary | ICD-10-CM | POA: Diagnosis not present

## 2018-06-12 ENCOUNTER — Telehealth: Payer: Self-pay

## 2018-06-12 MED ORDER — CARVEDILOL 12.5 MG PO TABS: 12.5000 mg | ORAL_TABLET | Freq: Two times a day (BID) | ORAL | 3 refills | Status: DC

## 2018-06-12 NOTE — Telephone Encounter (Signed)
Refilled coreg to [facility] pharmacy

## 2018-06-13 IMAGING — CR DG HUMERUS 2V *L*
3 series · 3 of 3 positions shown · non-contrast
Comparison: None.

CLINICAL DATA: Pain after fall.

EXAM:
LEFT HUMERUS - 2+ VIEW

[humerus ap]
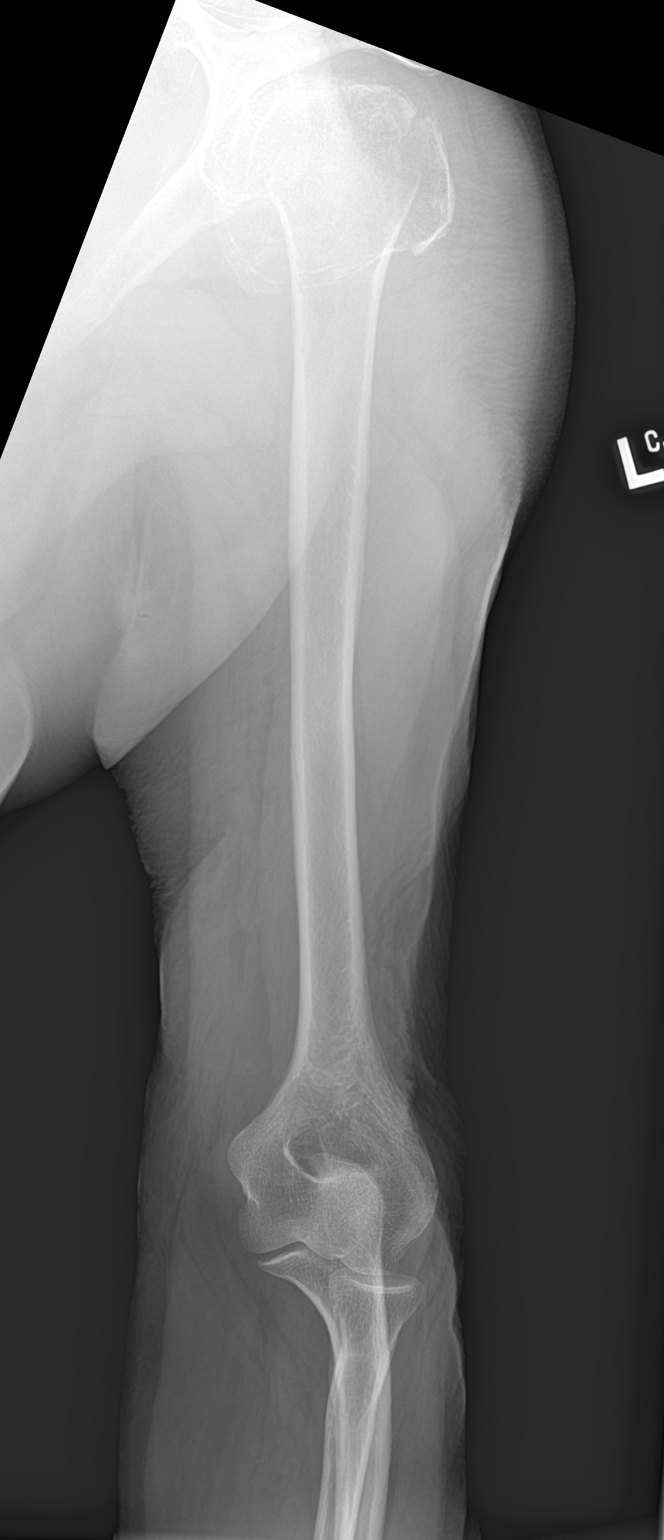

[humerus lat (1 of 2)]
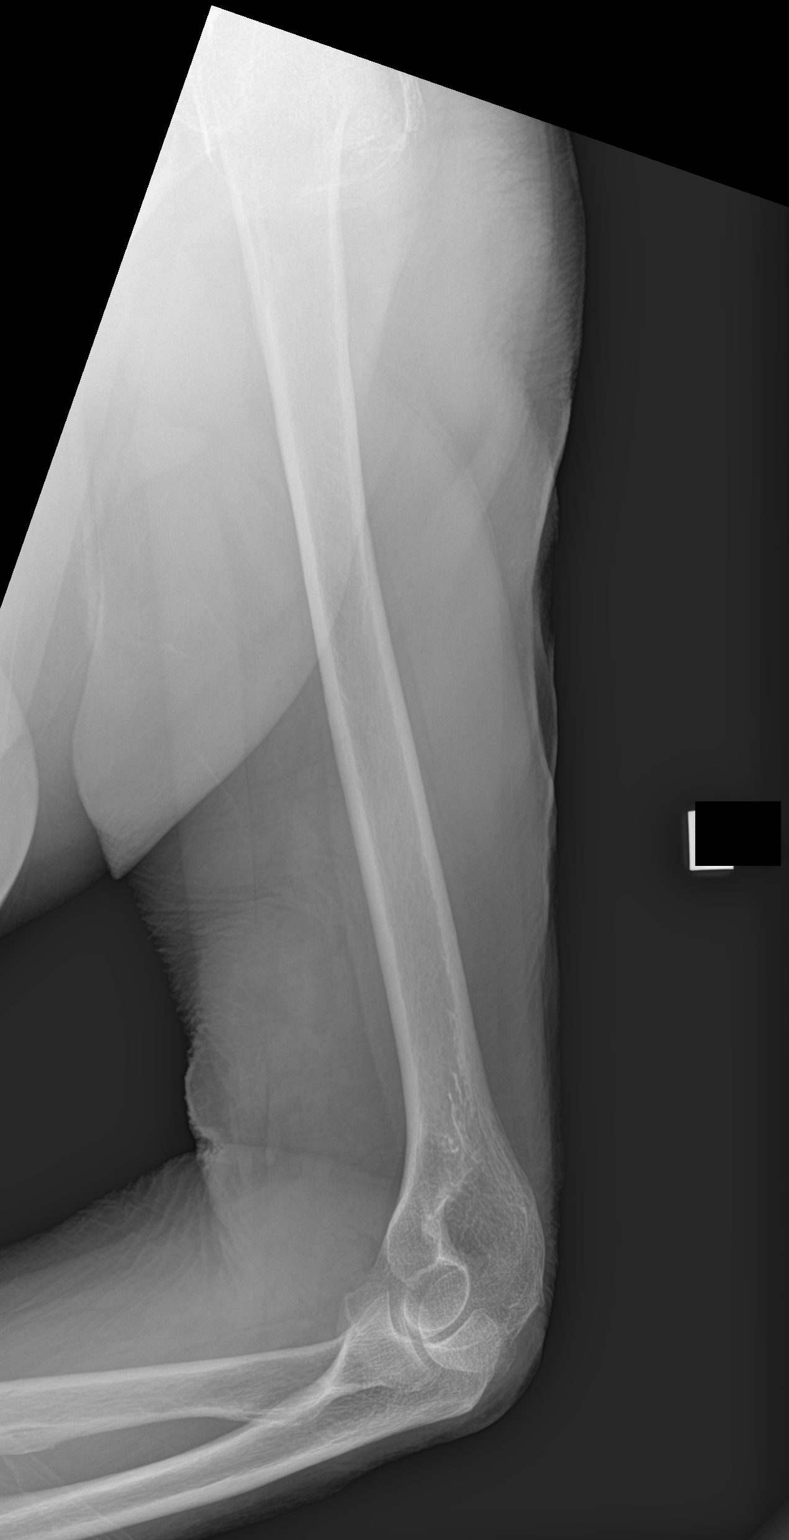

[humerus lat (2 of 2)]
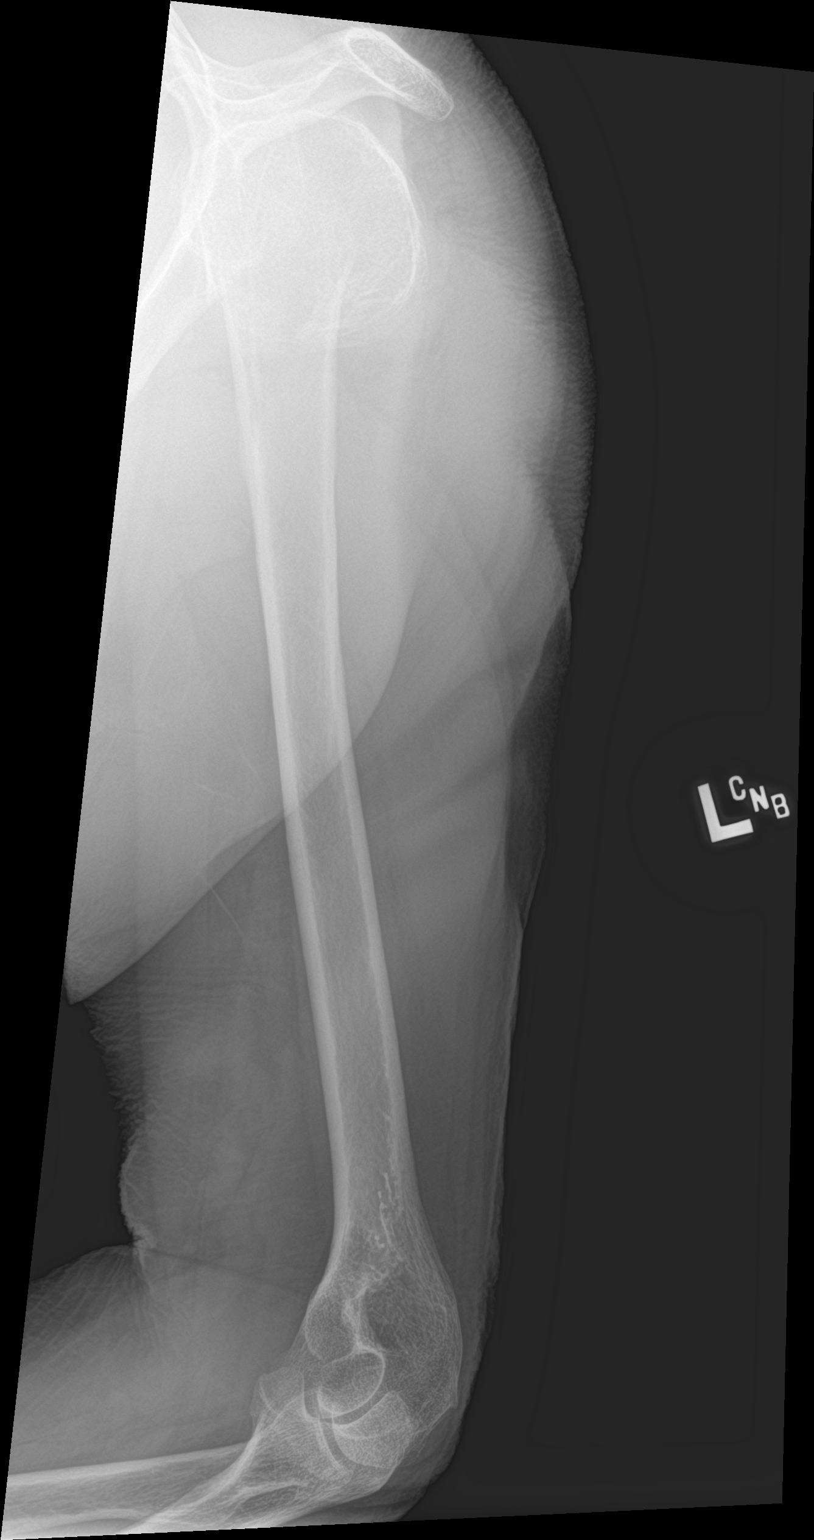

[3 of 3 positions shown; findings below may reference images not displayed]

FINDINGS: Irregularity of the left humeral head/neck, highly suggestive of
slightly displaced fracture. Remainder of the left humerus appears
intact and normally aligned.
IMPRESSION: Probable displaced fracture within the left humeral head and/or
neck. Consider dedicated plain film examination of the left shoulder
to confirm.

## 2018-06-14 ENCOUNTER — Telehealth: Payer: Self-pay | Admitting: Cardiovascular Disease

## 2018-06-14 NOTE — Telephone Encounter (Signed)
Patient had to clarify that she is taking coreg 12.5 mg BIB as ordered by MD in February

## 2018-06-14 NOTE — Telephone Encounter (Signed)
Patient is requesting call from nurse to discuss dosage of RX called in yesterday. / tg

## 2018-07-10 ENCOUNTER — Ambulatory Visit: Payer: Medicare Other | Admitting: Podiatry

## 2018-07-11 ENCOUNTER — Ambulatory Visit: Payer: Medicare Other | Admitting: Podiatry

## 2018-07-12 DIAGNOSIS — J019 Acute sinusitis, unspecified: Secondary | ICD-10-CM | POA: Diagnosis not present

## 2018-07-12 DIAGNOSIS — I10 Essential (primary) hypertension: Secondary | ICD-10-CM | POA: Diagnosis not present

## 2018-07-12 DIAGNOSIS — T7840XA Allergy, unspecified, initial encounter: Secondary | ICD-10-CM | POA: Diagnosis not present

## 2018-07-16 DIAGNOSIS — B309 Viral conjunctivitis, unspecified: Secondary | ICD-10-CM | POA: Diagnosis not present

## 2018-07-17 DIAGNOSIS — B029 Zoster without complications: Secondary | ICD-10-CM | POA: Diagnosis not present

## 2018-07-18 DIAGNOSIS — B309 Viral conjunctivitis, unspecified: Secondary | ICD-10-CM | POA: Diagnosis not present

## 2018-07-18 DIAGNOSIS — B029 Zoster without complications: Secondary | ICD-10-CM | POA: Diagnosis not present

## 2018-07-20 DIAGNOSIS — G4700 Insomnia, unspecified: Secondary | ICD-10-CM | POA: Diagnosis not present

## 2018-07-20 DIAGNOSIS — L299 Pruritus, unspecified: Secondary | ICD-10-CM | POA: Diagnosis not present

## 2018-07-20 DIAGNOSIS — B029 Zoster without complications: Secondary | ICD-10-CM | POA: Diagnosis not present

## 2018-07-24 ENCOUNTER — Ambulatory Visit: Payer: Medicare Other | Admitting: Podiatry

## 2018-07-25 DIAGNOSIS — B309 Viral conjunctivitis, unspecified: Secondary | ICD-10-CM | POA: Diagnosis not present

## 2018-07-26 DIAGNOSIS — I10 Essential (primary) hypertension: Secondary | ICD-10-CM | POA: Diagnosis not present

## 2018-07-26 DIAGNOSIS — G4700 Insomnia, unspecified: Secondary | ICD-10-CM | POA: Diagnosis not present

## 2018-07-26 DIAGNOSIS — B029 Zoster without complications: Secondary | ICD-10-CM | POA: Diagnosis not present

## 2018-08-01 DIAGNOSIS — H26493 Other secondary cataract, bilateral: Secondary | ICD-10-CM | POA: Diagnosis not present

## 2018-08-01 DIAGNOSIS — H16142 Punctate keratitis, left eye: Secondary | ICD-10-CM | POA: Diagnosis not present

## 2018-08-01 DIAGNOSIS — Z961 Presence of intraocular lens: Secondary | ICD-10-CM | POA: Diagnosis not present

## 2018-08-01 DIAGNOSIS — H35372 Puckering of macula, left eye: Secondary | ICD-10-CM | POA: Diagnosis not present

## 2018-08-01 DIAGNOSIS — H2000 Unspecified acute and subacute iridocyclitis: Secondary | ICD-10-CM | POA: Diagnosis not present

## 2018-08-03 DIAGNOSIS — H16142 Punctate keratitis, left eye: Secondary | ICD-10-CM | POA: Diagnosis not present

## 2018-08-03 DIAGNOSIS — Z961 Presence of intraocular lens: Secondary | ICD-10-CM | POA: Diagnosis not present

## 2018-08-03 DIAGNOSIS — H26493 Other secondary cataract, bilateral: Secondary | ICD-10-CM | POA: Diagnosis not present

## 2018-08-03 DIAGNOSIS — H35372 Puckering of macula, left eye: Secondary | ICD-10-CM | POA: Diagnosis not present

## 2018-08-03 DIAGNOSIS — H2000 Unspecified acute and subacute iridocyclitis: Secondary | ICD-10-CM | POA: Diagnosis not present

## 2018-08-05 DIAGNOSIS — I83813 Varicose veins of bilateral lower extremities with pain: Secondary | ICD-10-CM | POA: Diagnosis not present

## 2018-08-05 DIAGNOSIS — Z1159 Encounter for screening for other viral diseases: Secondary | ICD-10-CM | POA: Diagnosis not present

## 2018-08-07 DIAGNOSIS — Z961 Presence of intraocular lens: Secondary | ICD-10-CM | POA: Diagnosis not present

## 2018-08-07 DIAGNOSIS — H2000 Unspecified acute and subacute iridocyclitis: Secondary | ICD-10-CM | POA: Diagnosis not present

## 2018-08-07 DIAGNOSIS — H16142 Punctate keratitis, left eye: Secondary | ICD-10-CM | POA: Diagnosis not present

## 2018-08-07 DIAGNOSIS — H35372 Puckering of macula, left eye: Secondary | ICD-10-CM | POA: Diagnosis not present

## 2018-08-07 DIAGNOSIS — H26493 Other secondary cataract, bilateral: Secondary | ICD-10-CM | POA: Diagnosis not present

## 2018-08-13 ENCOUNTER — Ambulatory Visit: Payer: Medicare Other | Admitting: Podiatry

## 2018-08-14 DIAGNOSIS — H2000 Unspecified acute and subacute iridocyclitis: Secondary | ICD-10-CM | POA: Diagnosis not present

## 2018-08-14 DIAGNOSIS — H16142 Punctate keratitis, left eye: Secondary | ICD-10-CM | POA: Diagnosis not present

## 2018-08-20 DIAGNOSIS — B029 Zoster without complications: Secondary | ICD-10-CM | POA: Diagnosis not present

## 2018-08-20 DIAGNOSIS — L299 Pruritus, unspecified: Secondary | ICD-10-CM | POA: Diagnosis not present

## 2018-09-03 ENCOUNTER — Encounter: Payer: Self-pay | Admitting: Podiatry

## 2018-09-03 ENCOUNTER — Ambulatory Visit (INDEPENDENT_AMBULATORY_CARE_PROVIDER_SITE_OTHER): Payer: Medicare Other | Admitting: Podiatry

## 2018-09-03 ENCOUNTER — Other Ambulatory Visit: Payer: Self-pay

## 2018-09-03 VITALS — Temp 98.2°F

## 2018-09-03 DIAGNOSIS — B351 Tinea unguium: Secondary | ICD-10-CM

## 2018-09-03 DIAGNOSIS — M79674 Pain in right toe(s): Secondary | ICD-10-CM | POA: Diagnosis not present

## 2018-09-03 DIAGNOSIS — M79675 Pain in left toe(s): Secondary | ICD-10-CM

## 2018-09-03 NOTE — Patient Instructions (Signed)

## 2018-09-04 DIAGNOSIS — H2000 Unspecified acute and subacute iridocyclitis: Secondary | ICD-10-CM | POA: Diagnosis not present

## 2018-09-04 DIAGNOSIS — H16142 Punctate keratitis, left eye: Secondary | ICD-10-CM | POA: Diagnosis not present

## 2018-09-11 NOTE — Progress Notes (Signed)
Subjective:  Enrique Sack presents to clinic today with cc of  painful, thick, discolored, elongated toenails 1-5 b/l that become tender and cannot cut because of thickness.  Pain is aggravated when wearing enclosed shoe gear.  She voices no new pedal concerns on today's visit.   Current Outpatient Medications:  .  Ascorbic Acid (VITAMIN C) 100 MG tablet, Take by mouth., Disp: , Rfl:  .  Biotin 5000 MCG CAPS, Take 5,000 mcg by mouth daily., Disp: , Rfl:  .  carvedilol (COREG) 12.5 MG tablet, Take 1 tablet (12.5 mg total) by mouth 2 (two) times daily., Disp: 180 tablet, Rfl: 3 .  clobetasol ointment (TEMOVATE) 0.05 %, Apply topically., Disp: , Rfl:  .  denosumab (PROLIA) 60 MG/ML SOSY injection, Prolia 60 mg/mL subcutaneous syringe, Disp: , Rfl:  .  Desoximetasone (TOPICORT) 0.25 % ointment, Apply to rash BID prn. Medically necessary that base be free of propylene glycol, Disp: , Rfl:  .  Emollient (CERAVE) LOTN, Apply 1 application topically 3 (three) times daily as needed (itching)., Disp: , Rfl:  .  ezetimibe (ZETIA) 10 MG tablet, TAKE ONE (1) TABLET EACH DAY, Disp: 90 tablet, Rfl: 3 .  fluocinolone (SYNALAR) 0.025 % ointment, prn as needed, Disp: , Rfl:  .  hydrOXYzine (ATARAX/VISTARIL) 10 MG tablet, hydroxyzine HCl 10 mg tablet, Disp: , Rfl:  .  NON FORMULARY, [place] [facility]: Terbinafine 3%,Fluconazole 2%, Tea Tree Oil 5%, Urea 10%,Ibuprofen 2% in DMSO, Disp: , Rfl:  .  omeprazole (PRILOSEC OTC) 20 MG tablet, Take 20 mg by mouth daily., Disp: , Rfl:  .  potassium chloride (K-DUR) 10 MEQ tablet, TAKE ONE (1) TABLET BY MOUTH EVERY DAY, Disp: 90 tablet, Rfl: 1 .  temazepam (RESTORIL) 15 MG capsule, Take 15-30 mg by mouth at bedtime as needed for sleep (sleep). Reported on 08/26/2015, Disp: , Rfl:  .  hydrochlorothiazide (HYDRODIURIL) 25 MG tablet, Take 1 tablet (25 mg total) by mouth daily., Disp: 90 tablet, Rfl: 3 .  losartan (COZAAR) 100 MG tablet, Take 1 tablet (100 mg total) by mouth  daily., Disp: 90 tablet, Rfl: 3   Allergies  Allergen Reactions  . Baycol [Cerivastatin Sodium] Itching  . Crestor [Rosuvastatin Calcium] Itching  . Lipitor [Atorvastatin Calcium] Itching  . Morphine And Related Other (See Comments)    Hallucinations , skin crawling, "feels like elephants are crawling"  . Pravachol Other (See Comments)    Pt doesn't recall reaction  . Propylene Glycol Itching    Severe itching  . Balsam [place]-Castor [Amberderm] Itching and Rash  . Ciprofloxacin Hives and Rash  . Diphenhydramine Hcl Rash  . Methyldibromoglutaronitrile Rash    Per patch testing  . Other Itching and Rash    Botanicals , Fragrance , methyldibromo glutaronitrile, detergents can only use "All free and clear"     Objective: Vitals:   09/03/18 1038  Temp: 98.2 F (36.8 C)    Physical Examination:  Vascular Examination: Capillary refill time less than 3 seconds x 10 digits.    Palpable DP/PT pulses b/l.  Digital hair present b/l.  No edema noted b/l.  Skin temperature gradient WNL b/l.  Dermatological Examination: Skin with normal turgor, texture and tone b/l.  No open wounds b/l.  No interdigital macerations noted b/l.  Elongated, thick, discolored brittle toenails with subungual debris and pain on dorsal palpation of nailbeds 1-5 b/l.  Musculoskeletal Examination: Muscle strength 5/5 to all muscle groups b/l.  No pain, crepitus or joint discomfort with active/passive ROM.  Neurological Examination: Sensation intact 5/5 b/l with 10 gram monofilament.  Vibratory sensation intact b/l.  Proprioceptive sensation intact b/l.  Assessment: Mycotic nail infection with pain 1-5 b/l  Plan: 1. Toenails 1-5 b/l were debrided in length and girth without iatrogenic laceration. 2.  Continue soft, supportive shoe gear daily. 3.  Report any pedal injuries to medical professional. 4.  Follow up 3 months. 5.  Patient/POA to call should there be a question/concern in there  interim.

## 2018-09-20 DIAGNOSIS — L299 Pruritus, unspecified: Secondary | ICD-10-CM | POA: Diagnosis not present

## 2018-09-20 DIAGNOSIS — L235 Allergic contact dermatitis due to other chemical products: Secondary | ICD-10-CM | POA: Diagnosis not present

## 2018-09-26 DIAGNOSIS — H2000 Unspecified acute and subacute iridocyclitis: Secondary | ICD-10-CM | POA: Diagnosis not present

## 2018-09-26 DIAGNOSIS — H16142 Punctate keratitis, left eye: Secondary | ICD-10-CM | POA: Diagnosis not present

## 2018-09-26 DIAGNOSIS — Z961 Presence of intraocular lens: Secondary | ICD-10-CM | POA: Diagnosis not present

## 2018-09-28 ENCOUNTER — Other Ambulatory Visit: Payer: Self-pay | Admitting: Cardiovascular Disease

## 2018-10-03 DIAGNOSIS — B029 Zoster without complications: Secondary | ICD-10-CM | POA: Diagnosis not present

## 2018-10-18 ENCOUNTER — Ambulatory Visit (HOSPITAL_COMMUNITY): Payer: Medicare Other

## 2018-10-18 DIAGNOSIS — L309 Dermatitis, unspecified: Secondary | ICD-10-CM | POA: Diagnosis not present

## 2018-10-24 ENCOUNTER — Encounter (HOSPITAL_COMMUNITY): Admission: RE | Admit: 2018-10-24 | Payer: Medicare Other | Source: Ambulatory Visit

## 2018-10-24 DIAGNOSIS — Z79899 Other long term (current) drug therapy: Secondary | ICD-10-CM | POA: Diagnosis not present

## 2018-10-24 DIAGNOSIS — R05 Cough: Secondary | ICD-10-CM | POA: Diagnosis not present

## 2018-10-24 DIAGNOSIS — I10 Essential (primary) hypertension: Secondary | ICD-10-CM | POA: Diagnosis not present

## 2018-10-24 DIAGNOSIS — I8390 Asymptomatic varicose veins of unspecified lower extremity: Secondary | ICD-10-CM | POA: Diagnosis not present

## 2018-10-24 DIAGNOSIS — I773 Arterial fibromuscular dysplasia: Secondary | ICD-10-CM | POA: Diagnosis not present

## 2018-10-29 DIAGNOSIS — H2000 Unspecified acute and subacute iridocyclitis: Secondary | ICD-10-CM | POA: Diagnosis not present

## 2018-10-29 DIAGNOSIS — H52203 Unspecified astigmatism, bilateral: Secondary | ICD-10-CM | POA: Diagnosis not present

## 2018-10-29 DIAGNOSIS — H16142 Punctate keratitis, left eye: Secondary | ICD-10-CM | POA: Diagnosis not present

## 2018-10-29 DIAGNOSIS — H5213 Myopia, bilateral: Secondary | ICD-10-CM | POA: Diagnosis not present

## 2018-10-29 DIAGNOSIS — H524 Presbyopia: Secondary | ICD-10-CM | POA: Diagnosis not present

## 2018-10-30 ENCOUNTER — Encounter (HOSPITAL_COMMUNITY): Payer: Self-pay

## 2018-10-30 ENCOUNTER — Encounter (HOSPITAL_COMMUNITY)
Admission: RE | Admit: 2018-10-30 | Discharge: 2018-10-30 | Disposition: A | Discharge status: Home / Self Care | Payer: Medicare Other | Source: Ambulatory Visit | Attending: Pulmonary Disease | Admitting: Pulmonary Disease

## 2018-10-30 ENCOUNTER — Other Ambulatory Visit: Payer: Self-pay

## 2018-10-30 DIAGNOSIS — M810 Age-related osteoporosis without current pathological fracture: Secondary | ICD-10-CM | POA: Insufficient documentation

## 2018-10-30 MED ORDER — DENOSUMAB 60 MG/ML ~~LOC~~ SOSY
60.0000 mg | PREFILLED_SYRINGE | Freq: Once | SUBCUTANEOUS | Status: AC
  Administered 2018-10-30: 60 mg via SUBCUTANEOUS

## 2018-10-30 MED ORDER — DENOSUMAB 60 MG/ML ~~LOC~~ SOSY: 60.0000 mg | PREFILLED_SYRINGE | Freq: Once | SUBCUTANEOUS | Status: DC

## 2018-11-21 DIAGNOSIS — L299 Pruritus, unspecified: Secondary | ICD-10-CM | POA: Diagnosis not present

## 2018-11-21 DIAGNOSIS — L282 Other prurigo: Secondary | ICD-10-CM | POA: Diagnosis not present

## 2018-11-21 DIAGNOSIS — L309 Dermatitis, unspecified: Secondary | ICD-10-CM | POA: Diagnosis not present

## 2018-11-21 DIAGNOSIS — L259 Unspecified contact dermatitis, unspecified cause: Secondary | ICD-10-CM | POA: Diagnosis not present

## 2018-11-26 DIAGNOSIS — Z0000 Encounter for general adult medical examination without abnormal findings: Secondary | ICD-10-CM | POA: Diagnosis not present

## 2018-11-29 DIAGNOSIS — H16142 Punctate keratitis, left eye: Secondary | ICD-10-CM | POA: Diagnosis not present

## 2018-11-29 DIAGNOSIS — H2000 Unspecified acute and subacute iridocyclitis: Secondary | ICD-10-CM | POA: Diagnosis not present

## 2018-11-30 DIAGNOSIS — Z23 Encounter for immunization: Secondary | ICD-10-CM | POA: Diagnosis not present

## 2018-11-30 DIAGNOSIS — L308 Other specified dermatitis: Secondary | ICD-10-CM | POA: Diagnosis not present

## 2018-11-30 DIAGNOSIS — Z872 Personal history of diseases of the skin and subcutaneous tissue: Secondary | ICD-10-CM | POA: Diagnosis not present

## 2018-12-07 ENCOUNTER — Ambulatory Visit (INDEPENDENT_AMBULATORY_CARE_PROVIDER_SITE_OTHER): Payer: Medicare Other | Admitting: Podiatry

## 2018-12-07 ENCOUNTER — Other Ambulatory Visit: Payer: Self-pay

## 2018-12-07 ENCOUNTER — Encounter: Payer: Self-pay | Admitting: Podiatry

## 2018-12-07 DIAGNOSIS — B351 Tinea unguium: Secondary | ICD-10-CM | POA: Diagnosis not present

## 2018-12-07 DIAGNOSIS — M79674 Pain in right toe(s): Secondary | ICD-10-CM | POA: Diagnosis not present

## 2018-12-07 DIAGNOSIS — M79675 Pain in left toe(s): Secondary | ICD-10-CM | POA: Diagnosis not present

## 2018-12-07 NOTE — Patient Instructions (Signed)

## 2018-12-12 NOTE — Progress Notes (Signed)
Subjective: [name] is seen today for follow up painful, elongated, thickened toenails 1-5 b/l feet that she cannot cut. Pain interferes with daily activities. Aggravating factor includes wearing enclosed shoe gear and relieved with periodic debridement.  She states she will be going to get further patch testing done at her Dermatologist's office.  Current Outpatient Medications on File Prior to Visit  Medication Sig  . tobramycin-dexamethasone (TOBRADEX) ophthalmic solution   . Ascorbic Acid (VITAMIN C) 100 MG tablet Take by mouth.  . Biotin 5000 MCG CAPS Take 5,000 mcg by mouth daily.  . carvedilol (COREG) 12.5 MG tablet Take 1 tablet (12.5 mg total) by mouth 2 (two) times daily.  . clobetasol ointment (TEMOVATE) 0.05 % Apply topically.  Marland Kitchen denosumab (PROLIA) 60 MG/ML SOSY injection Prolia 60 mg/mL subcutaneous syringe  . Desoximetasone (TOPICORT) 0.25 % ointment Apply to rash BID prn. Medically necessary that base be free of propylene glycol  . Emollient (CERAVE) LOTN Apply 1 application topically 3 (three) times daily as needed (itching).  . ezetimibe (ZETIA) 10 MG tablet TAKE ONE (1) TABLET EACH DAY  . fluocinolone (SYNALAR) 0.025 % ointment prn as needed  . gabapentin (NEURONTIN) 300 MG capsule   . hydrochlorothiazide (HYDRODIURIL) 25 MG tablet Take 1 tablet (25 mg total) by mouth daily.  . hydrOXYzine (ATARAX/VISTARIL) 10 MG tablet hydroxyzine HCl 10 mg tablet  . hydrOXYzine (ATARAX/VISTARIL) 50 MG tablet   . INVELTYS 1 % SUSP   . losartan (COZAAR) 100 MG tablet Take 1 tablet (100 mg total) by mouth daily.  . NON FORMULARY  [facility]: Terbinafine 3%,Fluconazole 2%, Tea Tree Oil 5%, Urea 10%,Ibuprofen 2% in DMSO  . omeprazole (PRILOSEC OTC) 20 MG tablet Take 20 mg by mouth daily.  . potassium chloride (K-DUR) 10 MEQ tablet TAKE ONE (1) TABLET BY MOUTH EVERY DAY  . prednisoLONE acetate (PRED FORTE) 1 % ophthalmic suspension   . temazepam (RESTORIL) 15 MG capsule Take 15-30  mg by mouth at bedtime as needed for sleep (sleep). Reported on 08/26/2015  . valACYclovir (VALTREX) 1000 MG tablet    No current facility-administered medications on file prior to visit.      Allergies  Allergen Reactions  . Baycol [Cerivastatin Sodium] Itching  . Crestor [Rosuvastatin Calcium] Itching  . Lipitor [Atorvastatin Calcium] Itching  . Morphine And Related Other (See Comments)    Hallucinations , skin crawling, "feels like elephants are crawling"  . Pravachol Other (See Comments)    Pt doesn't recall reaction  . Propylene Glycol Itching    Severe itching  . Balsam [place]-Castor [Amberderm] Itching and Rash  . Ciprofloxacin Hives and Rash  . Diphenhydramine Hcl Rash  . Methyldibromoglutaronitrile Rash    Per patch testing  . Other Itching and Rash    Botanicals , Fragrance , methyldibromo glutaronitrile, detergents can only use "All free and clear"   Objective:  Vascular Examination: Capillary refill time <3 seconds x 10 digits.  Dorsalis pedis present b/l.  Posterior tibial pulses present b/l.  Digital hair  present x 10 digits.  Skin temperature gradient WNL b/l.   Dermatological Examination: Skin with normal turgor, texture and tone b/l.  Toenails 1-5 b/l discolored, thick, dystrophic with subungual debris and pain with palpation to nailbeds due to thickness of nails.  Musculoskeletal: Muscle strength 5/5 to all LE muscle groups  No gross bony deformities b/l.  No pain, crepitus or joint limitation noted with ROM.   Neurological Examination: Protective sensation intact with 10 gram monofilament bilaterally.  Epicritic sensation present bilaterally.  Vibratory sensation intact bilaterally.   Assessment: Painful onychomycosis toenails 1-5 b/l   Plan: 1. Toenails 1-5 b/l were debrided in length and girth without iatrogenic bleeding. 2. Patient to continue soft, supportive shoe gear 3. Patient to report any pedal injuries to medical professional  immediately. 4. Follow up 3 months.  5. Patient/POA to call should there be a concern in the interim.

## 2018-12-16 DIAGNOSIS — Z23 Encounter for immunization: Secondary | ICD-10-CM | POA: Diagnosis not present

## 2018-12-17 DIAGNOSIS — L309 Dermatitis, unspecified: Secondary | ICD-10-CM | POA: Diagnosis not present

## 2018-12-24 DIAGNOSIS — L299 Pruritus, unspecified: Secondary | ICD-10-CM | POA: Diagnosis not present

## 2018-12-24 DIAGNOSIS — L308 Other specified dermatitis: Secondary | ICD-10-CM | POA: Diagnosis not present

## 2019-01-03 DIAGNOSIS — H0102A Squamous blepharitis right eye, upper and lower eyelids: Secondary | ICD-10-CM | POA: Diagnosis not present

## 2019-01-03 DIAGNOSIS — H2000 Unspecified acute and subacute iridocyclitis: Secondary | ICD-10-CM | POA: Diagnosis not present

## 2019-01-03 DIAGNOSIS — Z961 Presence of intraocular lens: Secondary | ICD-10-CM | POA: Diagnosis not present

## 2019-01-03 DIAGNOSIS — H0102B Squamous blepharitis left eye, upper and lower eyelids: Secondary | ICD-10-CM | POA: Diagnosis not present

## 2019-01-03 DIAGNOSIS — H16142 Punctate keratitis, left eye: Secondary | ICD-10-CM | POA: Diagnosis not present

## 2019-01-07 DIAGNOSIS — L299 Pruritus, unspecified: Secondary | ICD-10-CM | POA: Diagnosis not present

## 2019-01-07 DIAGNOSIS — L309 Dermatitis, unspecified: Secondary | ICD-10-CM | POA: Diagnosis not present

## 2019-01-10 ENCOUNTER — Other Ambulatory Visit: Payer: Self-pay | Admitting: Cardiovascular Disease

## 2019-02-12 DIAGNOSIS — E785 Hyperlipidemia, unspecified: Secondary | ICD-10-CM | POA: Diagnosis not present

## 2019-02-12 DIAGNOSIS — M810 Age-related osteoporosis without current pathological fracture: Secondary | ICD-10-CM | POA: Diagnosis not present

## 2019-02-12 DIAGNOSIS — K219 Gastro-esophageal reflux disease without esophagitis: Secondary | ICD-10-CM | POA: Diagnosis not present

## 2019-02-12 DIAGNOSIS — B0222 Postherpetic trigeminal neuralgia: Secondary | ICD-10-CM | POA: Diagnosis not present

## 2019-02-12 DIAGNOSIS — I10 Essential (primary) hypertension: Secondary | ICD-10-CM | POA: Diagnosis not present

## 2019-03-20 ENCOUNTER — Ambulatory Visit (INDEPENDENT_AMBULATORY_CARE_PROVIDER_SITE_OTHER): Payer: Medicare Other | Admitting: Podiatry

## 2019-03-20 ENCOUNTER — Other Ambulatory Visit: Payer: Self-pay

## 2019-03-20 ENCOUNTER — Encounter: Payer: Self-pay | Admitting: Podiatry

## 2019-03-20 DIAGNOSIS — B351 Tinea unguium: Secondary | ICD-10-CM

## 2019-03-20 DIAGNOSIS — M79674 Pain in right toe(s): Secondary | ICD-10-CM | POA: Diagnosis not present

## 2019-03-20 DIAGNOSIS — M79675 Pain in left toe(s): Secondary | ICD-10-CM | POA: Diagnosis not present

## 2019-03-20 NOTE — Patient Instructions (Signed)

## 2019-03-21 DIAGNOSIS — L299 Pruritus, unspecified: Secondary | ICD-10-CM | POA: Diagnosis not present

## 2019-03-21 DIAGNOSIS — L821 Other seborrheic keratosis: Secondary | ICD-10-CM | POA: Diagnosis not present

## 2019-03-21 DIAGNOSIS — L814 Other melanin hyperpigmentation: Secondary | ICD-10-CM | POA: Diagnosis not present

## 2019-03-21 DIAGNOSIS — L309 Dermatitis, unspecified: Secondary | ICD-10-CM | POA: Diagnosis not present

## 2019-03-21 DIAGNOSIS — Z872 Personal history of diseases of the skin and subcutaneous tissue: Secondary | ICD-10-CM | POA: Diagnosis not present

## 2019-03-24 NOTE — Progress Notes (Signed)
Subjective:  [name] presents to clinic today with cc of  painful, thick, discolored, elongated toenails  of both feet that become tender and patient cannot cut because of thickness. Pain is aggravated when wearing enclosed shoe gear and relieved with periodic professional debridement.  She continues to use compounded topical antifungal on her nails from [place]. She feels her nails look better.   Patient voices no new pedal concerns on today's visit.  Medications reviewed in chart.  Allergies  Allergen Reactions  . Baycol [Cerivastatin Sodium] Itching  . Crestor [Rosuvastatin Calcium] Itching  . Lipitor [Atorvastatin Calcium] Itching  . Morphine And Related Other (See Comments)    Hallucinations , skin crawling, "feels like elephants are crawling"  . Pravachol Other (See Comments)    Pt doesn't recall reaction  . Propylene Glycol Itching    Severe itching  . Balsam [place]-Castor [Amberderm] Itching and Rash  . Ciprofloxacin Hives and Rash  . Diphenhydramine Hcl Rash  . Methyldibromoglutaronitrile Rash    Per patch testing  . Other Itching and Rash    Botanicals , Fragrance , methyldibromo glutaronitrile, detergents can only use "All free and clear"    Objective:  Physical Examination:  Vascular Examination: Capillary refill time <3 seconds b/l.   Palpable DP/PT pulses b/l.  Digital hair present b/l.   No edema noted b/l.  Skin temperature gradient WNL b/l.  Dermatological Examination: Skin with normal turgor, texture and tone b/l.  No open wounds b/l.  No interdigital macerations noted b/l.  Elongated, thick, discolored brittle toenails with subungual debris and pain on dorsal palpation of nailbeds 1-5 b/l.  Musculoskeletal Examination: Muscle strength 5/5 to all muscle groups b/l.  No pain, crepitus or joint discomfort with active/passive ROM.  Neurological Examination: Sensation intact 5/5 b/l with 10 gram monofilament.  Vibratory  sensation intact b/l.  Proprioceptive sensation intact b/l.  Assessment: Mycotic nail infection with pain 1-5 b/l  Plan: 1. Toenails 1-5 b/l were debrided in length and girth without iatrogenic laceration. Continue topical antifungal to toenails before bedtime. 2.  Continue soft, supportive shoe gear daily. 3.  Report any pedal injuries to medical professional. 4.  Follow up 3 months. 5.  Patient/POA to call should there be a question/concern in there interim.

## 2019-04-10 ENCOUNTER — Other Ambulatory Visit: Payer: Self-pay | Admitting: Cardiovascular Disease

## 2019-05-01 DIAGNOSIS — L309 Dermatitis, unspecified: Secondary | ICD-10-CM | POA: Diagnosis not present

## 2019-05-01 DIAGNOSIS — L2084 Intrinsic (allergic) eczema: Secondary | ICD-10-CM | POA: Diagnosis not present

## 2019-05-02 ENCOUNTER — Encounter (HOSPITAL_COMMUNITY): Admission: RE | Admit: 2019-05-02 | Payer: Medicare Other | Source: Ambulatory Visit

## 2019-05-06 ENCOUNTER — Encounter (HOSPITAL_COMMUNITY): Admission: RE | Admit: 2019-05-06 | Payer: Medicare Other | Source: Ambulatory Visit

## 2019-05-07 DIAGNOSIS — E785 Hyperlipidemia, unspecified: Secondary | ICD-10-CM | POA: Diagnosis not present

## 2019-05-07 DIAGNOSIS — I10 Essential (primary) hypertension: Secondary | ICD-10-CM | POA: Diagnosis not present

## 2019-05-07 DIAGNOSIS — Z1329 Encounter for screening for other suspected endocrine disorder: Secondary | ICD-10-CM | POA: Diagnosis not present

## 2019-05-07 DIAGNOSIS — Z712 Person consulting for explanation of examination or test findings: Secondary | ICD-10-CM | POA: Diagnosis not present

## 2019-05-07 DIAGNOSIS — R7301 Impaired fasting glucose: Secondary | ICD-10-CM | POA: Diagnosis not present

## 2019-05-09 DIAGNOSIS — Z1231 Encounter for screening mammogram for malignant neoplasm of breast: Secondary | ICD-10-CM | POA: Diagnosis not present

## 2019-05-13 DIAGNOSIS — M810 Age-related osteoporosis without current pathological fracture: Secondary | ICD-10-CM | POA: Diagnosis not present

## 2019-05-13 DIAGNOSIS — K219 Gastro-esophageal reflux disease without esophagitis: Secondary | ICD-10-CM | POA: Diagnosis not present

## 2019-05-13 DIAGNOSIS — Z96643 Presence of artificial hip joint, bilateral: Secondary | ICD-10-CM | POA: Diagnosis not present

## 2019-05-13 DIAGNOSIS — R7303 Prediabetes: Secondary | ICD-10-CM | POA: Diagnosis not present

## 2019-05-13 DIAGNOSIS — L209 Atopic dermatitis, unspecified: Secondary | ICD-10-CM | POA: Diagnosis not present

## 2019-05-13 DIAGNOSIS — I10 Essential (primary) hypertension: Secondary | ICD-10-CM | POA: Diagnosis not present

## 2019-05-13 DIAGNOSIS — B0222 Postherpetic trigeminal neuralgia: Secondary | ICD-10-CM | POA: Diagnosis not present

## 2019-05-13 DIAGNOSIS — E785 Hyperlipidemia, unspecified: Secondary | ICD-10-CM | POA: Diagnosis not present

## 2019-05-14 ENCOUNTER — Other Ambulatory Visit: Payer: Self-pay

## 2019-05-14 ENCOUNTER — Encounter (HOSPITAL_COMMUNITY)
Admission: RE | Admit: 2019-05-14 | Discharge: 2019-05-14 | Disposition: A | Discharge status: Home / Self Care | Payer: Medicare Other | Source: Ambulatory Visit | Attending: Internal Medicine | Admitting: Internal Medicine

## 2019-05-14 ENCOUNTER — Encounter (HOSPITAL_COMMUNITY): Payer: Self-pay

## 2019-05-14 DIAGNOSIS — M810 Age-related osteoporosis without current pathological fracture: Secondary | ICD-10-CM | POA: Diagnosis not present

## 2019-05-14 MED ORDER — DENOSUMAB 60 MG/ML ~~LOC~~ SOSY
60.0000 mg | PREFILLED_SYRINGE | Freq: Once | SUBCUTANEOUS | Status: AC
  Administered 2019-05-14: 60 mg via SUBCUTANEOUS

## 2019-06-11 DIAGNOSIS — H2000 Unspecified acute and subacute iridocyclitis: Secondary | ICD-10-CM | POA: Diagnosis not present

## 2019-06-11 DIAGNOSIS — B0239 Other herpes zoster eye disease: Secondary | ICD-10-CM | POA: Diagnosis not present

## 2019-06-13 ENCOUNTER — Encounter: Payer: Self-pay | Admitting: Cardiovascular Disease

## 2019-06-13 ENCOUNTER — Ambulatory Visit (INDEPENDENT_AMBULATORY_CARE_PROVIDER_SITE_OTHER): Payer: Medicare Other | Admitting: Cardiovascular Disease

## 2019-06-13 ENCOUNTER — Other Ambulatory Visit: Payer: Self-pay

## 2019-06-13 VITALS — BP 126/72 | Temp 97.4°F | Ht 61.0 in | Wt 148.6 lb

## 2019-06-13 DIAGNOSIS — I10 Essential (primary) hypertension: Secondary | ICD-10-CM | POA: Diagnosis not present

## 2019-06-13 DIAGNOSIS — Z8679 Personal history of other diseases of the circulatory system: Secondary | ICD-10-CM

## 2019-06-13 DIAGNOSIS — E785 Hyperlipidemia, unspecified: Secondary | ICD-10-CM

## 2019-06-13 NOTE — Patient Instructions (Signed)

## 2019-06-13 NOTE — Progress Notes (Signed)
SUBJECTIVE: The patient presents for follow-up of hypertension.  She has renal artery stenos status post bilateral angioplasty in the 1980s and hyperlipidemia.  I last saw her in June 2019. She underwent a nuclear stress test in May 2010 which showed no evidence of ischemia, LVEF 76%.  She has no known history of coronary artery disease.  The patient denies any symptoms of chest pain, palpitations, shortness of breath, lightheadedness, dizziness, leg swelling, orthopnea, PND, and syncope.  Her primary complaint today relates to shingles on the left side of her head.  She is also taking acyclovir due to iritis in the left eye.      Review of Systems: As per "subjective", otherwise negative.  Allergies  Allergen Reactions  . Baycol [Cerivastatin Sodium] Itching  . Crestor [Rosuvastatin Calcium] Itching  . Lipitor [Atorvastatin Calcium] Itching  . Morphine And Related Other (See Comments)    Hallucinations , skin crawling, "feels like elephants are crawling"  . Pravachol Other (See Comments)    Pt doesn't recall reaction  . Propylene Glycol Itching    Severe itching  . Balsam [place]-Castor [Amberderm] Itching and Rash  . Ciprofloxacin Hives and Rash  . Diphenhydramine Hcl Rash  . Methyldibromoglutaronitrile Rash    Per patch testing  . Other Itching and Rash    Botanicals , Fragrance , methyldibromo glutaronitrile, detergents can only use "All free and clear"    Current Outpatient Medications  Medication Sig Dispense Refill  . Ascorbic Acid (VITAMIN C) 100 MG tablet Take by mouth.    . Biotin 5000 MCG CAPS Take 5,000 mcg by mouth daily.    . carvedilol (COREG) 12.5 MG tablet Take 1 tablet (12.5 mg total) by mouth 2 (two) times daily. 180 tablet 3  . clobetasol ointment (TEMOVATE) 0.05 % Apply topically.    Marland Kitchen denosumab (PROLIA) 60 MG/ML SOSY injection Prolia 60 mg/mL subcutaneous syringe    . Desoximetasone (TOPICORT) 0.25 % ointment Apply to rash BID prn. Medically  necessary that base be free of propylene glycol    . Emollient (CERAVE) LOTN Apply 1 application topically 3 (three) times daily as needed (itching).    . ezetimibe (ZETIA) 10 MG tablet TAKE ONE (1) TABLET BY MOUTH EVERY DAY 90 tablet 3  . fluocinolone (SYNALAR) 0.025 % ointment prn as needed    . gabapentin (NEURONTIN) 100 MG capsule     . gabapentin (NEURONTIN) 300 MG capsule at bedtime.     . hydrochlorothiazide (HYDRODIURIL) 25 MG tablet TAKE ONE TABLET (25MG  TOTAL) BY MOUTH DAILY 90 tablet 3  . hydrOXYzine (ATARAX/VISTARIL) 10 MG tablet hydroxyzine HCl 10 mg tablet    . hydrOXYzine (ATARAX/VISTARIL) 50 MG tablet     . INVELTYS 1 % SUSP     . losartan (COZAAR) 100 MG tablet TAKE ONE TABLET (100MG  TOTAL) BY MOUTH DAILY 90 tablet 3  . NON FORMULARY [facility]: Terbinafine 3%,Fluconazole 2%, Tea Tree Oil 5%, Urea 10%,Ibuprofen 2% in DMSO    . omeprazole (PRILOSEC OTC) 20 MG tablet Take 20 mg by mouth daily.    . potassium chloride (KLOR-CON) 10 MEQ tablet TAKE ONE (1) TABLET BY MOUTH EVERY DAY 90 tablet 1  . prednisoLONE acetate (PRED FORTE) 1 % ophthalmic suspension     . temazepam (RESTORIL) 15 MG capsule Take 15-30 mg by mouth at bedtime as needed for sleep (sleep). Reported on 08/26/2015    . tobramycin-dexamethasone (TOBRADEX) ophthalmic solution     . valACYclovir (VALTREX) 1000 MG tablet  No current facility-administered medications for this visit.    Past Medical History:  Diagnosis Date  . Fibrocystic breast disease   . GERD (gastroesophageal reflux disease)   . H/O: hysterectomy   . Humerus fracture    right proximal  . Hypercholesterolemia   . Hypertension 1984   renal artery    Past Surgical History:  Procedure Laterality Date  . ABDOMINAL HYSTERECTOMY    . BELPHAROPTOSIS REPAIR    . breast biopsies     for benign lumps  . BREAST LUMPECTOMY  1985  . CATARACT EXTRACTION W/PHACO Left 08/22/2016   Procedure: CATARACT EXTRACTION PHACO AND INTRAOCULAR LENS  PLACEMENT (IOC);  Surgeon: Gemma Payor, MD;  Location: [facility];  Service: Ophthalmology;  Laterality: Left;  CDE: 8.63  . CATARACT EXTRACTION W/PHACO Right 09/19/2016   Procedure: CATARACT EXTRACTION PHACO AND INTRAOCULAR LENS PLACEMENT (IOC);  Surgeon: Gemma Payor, MD;  Location: [facility];  Service: Ophthalmology;  Laterality: Right;  CDE: 9.96  . COLONOSCOPY    . RENAL ARTERY ANGIOPLASTY  1987   right Dr. Jean Rosenthal  . REVERSE SHOULDER ARTHROPLASTY Right 09/23/2016   Procedure: REVERSE SHOULDER ARTHROPLASTY;  Surgeon: Beverely Low, MD;  Location: [facility];  Service: Orthopedics;  Laterality: Right;  2 hrs  . TONSILLECTOMY    . TONSILLECTOMY    . TOTAL HIP ARTHROPLASTY     right  . VEIN LIGATION  1974   both legs  . WRIST FRACTURE SURGERY Right 2002  . WRIST FRACTURE SURGERY Left 2017    Social History   Socioeconomic History  . Marital status: Married    Spouse name: Not on file  . Number of children: Not on file  . Years of education: Not on file  . Highest education level: Not on file  Occupational History  . Not on file  Tobacco Use  . Smoking status: Former Smoker    Quit date: 03/15/1987    Years since quitting: 32.2  . Smokeless tobacco: Never Used  Substance and Sexual Activity  . Alcohol use: Not Currently    Alcohol/week: 0.0 standard drinks    Comment: occasional  . Drug use: No  . Sexual activity: Not on file  Other Topics Concern  . Not on file  Social History Narrative  . Not on file   Social Determinants of Health   Financial Resource Strain:   . Difficulty of Paying Living Expenses:   Food Insecurity:   . Worried About Programme researcher, broadcasting/film/video in the Last Year:   . Barista in the Last Year:   Transportation Needs:   . Freight forwarder (Medical):   Marland Kitchen Lack of Transportation (Non-Medical):   Physical Activity:   . Days of Exercise per Week:   . Minutes of Exercise per Session:   Stress:   . Feeling of Stress :   Social Connections:   . Frequency  of Communication with Friends and Family:   . Frequency of Social Gatherings with Friends and Family:   . Attends Religious Services:   . Active Member of Clubs or Organizations:   . Attends Banker Meetings:   Marland Kitchen Marital Status:   Intimate Partner Violence:   . Fear of Current or Ex-Partner:   . Emotionally Abused:   Marland Kitchen Physically Abused:   . Sexually Abused:     Garnet Sierras, LPN was present throughout the entirety of the encounter.  Vitals:   06/13/19 0822  BP: 126/72  Temp: (!) 97.4 F (  36.3 C)  Height: 5\' 1"  (1.549 m)    Wt Readings from Last 3 Encounters:  05/14/19 167 lb 12.3 oz (76.1 kg)  03/30/18 167 lb 12.8 oz (76.1 kg)  02/23/18 155 lb (70.3 kg)     PHYSICAL EXAM General: NAD HEENT: Normal. Neck: No JVD, no thyromegaly. Lungs: Clear to auscultation bilaterally with normal respiratory effort. CV: Regular rate and rhythm, normal S1/S2, no S3/S4, no murmur. No pretibial or periankle edema.  No carotid bruit.   Abdomen: Soft, nontender, no distention.  Neurologic: Alert and oriented.  Psych: Normal affect. Skin: Normal. Musculoskeletal: No gross deformities.      Labs: Lab Results  Component Value Date/Time   K 3.5 09/24/2016 05:30 AM   BUN 8 09/24/2016 05:30 AM   CREATININE 0.87 09/24/2016 05:30 AM   CREATININE 0.98 (H) 10/26/2015 09:59 AM   ALT 14 12/25/2014 11:03 AM   TSH 2.52 10/04/2012 03:35 PM   HGB 10.7 (L) 09/24/2016 05:30 AM     Lipids: Lab Results  Component Value Date/Time   LDLCALC 107 10/26/2015 09:59 AM   CHOL 179 10/26/2015 09:59 AM   TRIG 113 10/26/2015 09:59 AM   HDL 49 10/26/2015 09:59 AM       ASSESSMENT AND PLAN:  1.  Hypertension: Blood pressure is normal.  No changes to therapy.  2.  History of renal artery stenosis: Status post bilateral angioplasty in the 1980s.  Blood pressure is normal.  3.  Hyperlipidemia: Statin intolerant.  She remains on Zetia.   Disposition: Follow up 1 year   05-09-1994, M.D., F.A.C.C.

## 2019-06-17 DIAGNOSIS — B0239 Other herpes zoster eye disease: Secondary | ICD-10-CM | POA: Diagnosis not present

## 2019-06-17 DIAGNOSIS — H2000 Unspecified acute and subacute iridocyclitis: Secondary | ICD-10-CM | POA: Diagnosis not present

## 2019-06-18 ENCOUNTER — Ambulatory Visit (INDEPENDENT_AMBULATORY_CARE_PROVIDER_SITE_OTHER): Payer: Medicare Other | Admitting: Podiatry

## 2019-06-18 ENCOUNTER — Other Ambulatory Visit: Payer: Self-pay

## 2019-06-18 ENCOUNTER — Encounter: Payer: Self-pay | Admitting: Podiatry

## 2019-06-18 DIAGNOSIS — B351 Tinea unguium: Secondary | ICD-10-CM

## 2019-06-18 DIAGNOSIS — M79675 Pain in left toe(s): Secondary | ICD-10-CM | POA: Diagnosis not present

## 2019-06-18 DIAGNOSIS — M79674 Pain in right toe(s): Secondary | ICD-10-CM

## 2019-06-18 NOTE — Patient Instructions (Signed)

## 2019-06-24 DIAGNOSIS — B0222 Postherpetic trigeminal neuralgia: Secondary | ICD-10-CM | POA: Diagnosis not present

## 2019-06-24 DIAGNOSIS — I10 Essential (primary) hypertension: Secondary | ICD-10-CM | POA: Diagnosis not present

## 2019-06-24 NOTE — Progress Notes (Signed)
Subjective: [name] presents today for follow up of painful mycotic nails b/l that are difficult to trim. Pain interferes with ambulation. Aggravating factors include wearing enclosed shoe gear. Pain is relieved with periodic professional debridement.   Allergies  Allergen Reactions  . Baycol [Cerivastatin Sodium] Itching  . Crestor [Rosuvastatin Calcium] Itching  . Lipitor [Atorvastatin Calcium] Itching  . Morphine And Related Other (See Comments)    Hallucinations , skin crawling, "feels like elephants are crawling"  . Pravachol Other (See Comments)    Pt doesn't recall reaction  . Propylene Glycol Itching    Severe itching  . Balsam [place]-Castor [Amberderm] Itching and Rash  . Ciprofloxacin Hives and Rash  . Diphenhydramine Hcl Rash  . Methyldibromoglutaronitrile Rash    Per patch testing  . Other Itching and Rash    Botanicals , Fragrance , methyldibromo glutaronitrile, detergents can only use "All free and clear"    Objective: There were no vitals filed for this visit.  Pt 86 y.o. year old Caucasian female  in NAD. AAO x 3.   Vascular Examination:  Capillary refill time to digits immediate b/l. Palpable DP pulses b/l. Palpable PT pulses b/l. Pedal hair present b/l. Skin temperature gradient within normal limits b/l.  Dermatological Examination: Pedal skin with normal turgor, texture and tone bilaterally. No open wounds bilaterally. Toenails 1-5 b/l elongated, dystrophic, thickened, crumbly with subungual debris and tenderness to dorsal palpation.  Musculoskeletal: Normal muscle strength 5/5 to all lower extremity muscle groups bilaterally, no gross bony deformities bilaterally and no pain crepitus or joint limitation noted with ROM b/l  Neurological: Protective sensation intact 5/5 intact bilaterally with 10g monofilament b/l Vibratory sensation intact b/l Babinski reflex negative b/l Clonus negative b/l  Assessment: 1. Pain due to onychomycosis of toenails of both  feet    Plan: -Toenails 1-5 b/l were debrided in length and girth with sterile nail nippers and dremel without iatrogenic bleeding.  -Patient to continue soft, supportive shoe gear daily. -Patient to report any pedal injuries to medical professional immediately. -Patient/POA to call should there be question/concern in the interim.  Return in about 3 months (around 09/17/2019) for nail trim.

## 2019-07-08 ENCOUNTER — Other Ambulatory Visit: Payer: Self-pay | Admitting: Cardiovascular Disease

## 2019-07-24 DIAGNOSIS — H0102A Squamous blepharitis right eye, upper and lower eyelids: Secondary | ICD-10-CM | POA: Diagnosis not present

## 2019-07-24 DIAGNOSIS — Z961 Presence of intraocular lens: Secondary | ICD-10-CM | POA: Diagnosis not present

## 2019-07-24 DIAGNOSIS — H0102B Squamous blepharitis left eye, upper and lower eyelids: Secondary | ICD-10-CM | POA: Diagnosis not present

## 2019-07-24 DIAGNOSIS — B0239 Other herpes zoster eye disease: Secondary | ICD-10-CM | POA: Diagnosis not present

## 2019-07-24 DIAGNOSIS — H2000 Unspecified acute and subacute iridocyclitis: Secondary | ICD-10-CM | POA: Diagnosis not present

## 2019-07-24 DIAGNOSIS — H18513 Endothelial corneal dystrophy, bilateral: Secondary | ICD-10-CM | POA: Diagnosis not present

## 2019-07-24 DIAGNOSIS — H16142 Punctate keratitis, left eye: Secondary | ICD-10-CM | POA: Diagnosis not present

## 2019-07-31 DIAGNOSIS — L570 Actinic keratosis: Secondary | ICD-10-CM | POA: Diagnosis not present

## 2019-07-31 DIAGNOSIS — B0229 Other postherpetic nervous system involvement: Secondary | ICD-10-CM | POA: Diagnosis not present

## 2019-07-31 DIAGNOSIS — L309 Dermatitis, unspecified: Secondary | ICD-10-CM | POA: Diagnosis not present

## 2019-09-09 DIAGNOSIS — H18513 Endothelial corneal dystrophy, bilateral: Secondary | ICD-10-CM | POA: Diagnosis not present

## 2019-09-09 DIAGNOSIS — H0102B Squamous blepharitis left eye, upper and lower eyelids: Secondary | ICD-10-CM | POA: Diagnosis not present

## 2019-09-09 DIAGNOSIS — H16223 Keratoconjunctivitis sicca, not specified as Sjogren's, bilateral: Secondary | ICD-10-CM | POA: Diagnosis not present

## 2019-09-09 DIAGNOSIS — B0239 Other herpes zoster eye disease: Secondary | ICD-10-CM | POA: Diagnosis not present

## 2019-09-09 DIAGNOSIS — H0102A Squamous blepharitis right eye, upper and lower eyelids: Secondary | ICD-10-CM | POA: Diagnosis not present

## 2019-09-18 ENCOUNTER — Ambulatory Visit: Payer: Medicare Other | Admitting: Podiatry

## 2019-09-20 ENCOUNTER — Encounter: Payer: Self-pay | Admitting: Podiatry

## 2019-09-20 ENCOUNTER — Ambulatory Visit (INDEPENDENT_AMBULATORY_CARE_PROVIDER_SITE_OTHER): Payer: Medicare Other | Admitting: Podiatry

## 2019-09-20 ENCOUNTER — Other Ambulatory Visit: Payer: Self-pay

## 2019-09-20 DIAGNOSIS — B351 Tinea unguium: Secondary | ICD-10-CM

## 2019-09-20 DIAGNOSIS — M79675 Pain in left toe(s): Secondary | ICD-10-CM | POA: Diagnosis not present

## 2019-09-20 DIAGNOSIS — M79674 Pain in right toe(s): Secondary | ICD-10-CM | POA: Diagnosis not present

## 2019-09-20 NOTE — Progress Notes (Signed)
This patient presents to the office with chief complaint of long thick painful nails.  Patient says the nails are painful walking and wearing shoes.  This patient is unable to self treat.  This patient is unable to trim her nails since she is unable to reach her nails.  She presents to the office for preventative foot care services.  General Appearance  Alert, conversant and in no acute stress.  Vascular  Dorsalis pedis and posterior tibial  pulses are palpable  bilaterally.  Capillary return is within normal limits  bilaterally. Temperature is within normal limits  bilaterally.  Neurologic  Senn-Weinstein monofilament wire test within normal limits  bilaterally. Muscle power within normal limits bilaterally.  Nails Thick disfigured discolored nails with subungual debris  from hallux to fifth toes bilaterally. No evidence of bacterial infection or drainage bilaterally.  Orthopedic  No limitations of motion  feet .  No crepitus or effusions noted.  No bony pathology or digital deformities noted.  DJD midfoot  B/L.  Forefoot equinus  B/L.  Skin  normotropic skin with no porokeratosis noted bilaterally.  No signs of infections or ulcers noted.     Onychomycosis  Nails  B/L.  Pain in right toes  Pain in left toes  Debridement of nails with nail nipper followed trimming the nails with dremel tool.    RTC 3 months. Told her to continue using her OTC fungal medicine.   Helane Gunther DPM

## 2019-10-08 DIAGNOSIS — H0102A Squamous blepharitis right eye, upper and lower eyelids: Secondary | ICD-10-CM | POA: Diagnosis not present

## 2019-10-08 DIAGNOSIS — H16223 Keratoconjunctivitis sicca, not specified as Sjogren's, bilateral: Secondary | ICD-10-CM | POA: Diagnosis not present

## 2019-10-08 DIAGNOSIS — H0102B Squamous blepharitis left eye, upper and lower eyelids: Secondary | ICD-10-CM | POA: Diagnosis not present

## 2019-10-23 DIAGNOSIS — Z03818 Encounter for observation for suspected exposure to other biological agents ruled out: Secondary | ICD-10-CM | POA: Diagnosis not present

## 2019-10-28 DIAGNOSIS — B0222 Postherpetic trigeminal neuralgia: Secondary | ICD-10-CM | POA: Diagnosis not present

## 2019-10-28 DIAGNOSIS — Z96643 Presence of artificial hip joint, bilateral: Secondary | ICD-10-CM | POA: Diagnosis not present

## 2019-10-28 DIAGNOSIS — R7303 Prediabetes: Secondary | ICD-10-CM | POA: Diagnosis not present

## 2019-10-28 DIAGNOSIS — L209 Atopic dermatitis, unspecified: Secondary | ICD-10-CM | POA: Diagnosis not present

## 2019-10-28 DIAGNOSIS — E785 Hyperlipidemia, unspecified: Secondary | ICD-10-CM | POA: Diagnosis not present

## 2019-10-28 DIAGNOSIS — Z712 Person consulting for explanation of examination or test findings: Secondary | ICD-10-CM | POA: Diagnosis not present

## 2019-10-28 DIAGNOSIS — M810 Age-related osteoporosis without current pathological fracture: Secondary | ICD-10-CM | POA: Diagnosis not present

## 2019-10-28 DIAGNOSIS — K219 Gastro-esophageal reflux disease without esophagitis: Secondary | ICD-10-CM | POA: Diagnosis not present

## 2019-10-28 DIAGNOSIS — I10 Essential (primary) hypertension: Secondary | ICD-10-CM | POA: Diagnosis not present

## 2019-10-31 DIAGNOSIS — L209 Atopic dermatitis, unspecified: Secondary | ICD-10-CM | POA: Diagnosis not present

## 2019-10-31 DIAGNOSIS — I10 Essential (primary) hypertension: Secondary | ICD-10-CM | POA: Diagnosis not present

## 2019-10-31 DIAGNOSIS — Z0001 Encounter for general adult medical examination with abnormal findings: Secondary | ICD-10-CM | POA: Diagnosis not present

## 2019-10-31 DIAGNOSIS — Z712 Person consulting for explanation of examination or test findings: Secondary | ICD-10-CM | POA: Diagnosis not present

## 2019-10-31 DIAGNOSIS — M810 Age-related osteoporosis without current pathological fracture: Secondary | ICD-10-CM | POA: Diagnosis not present

## 2019-10-31 DIAGNOSIS — K219 Gastro-esophageal reflux disease without esophagitis: Secondary | ICD-10-CM | POA: Diagnosis not present

## 2019-10-31 DIAGNOSIS — B0222 Postherpetic trigeminal neuralgia: Secondary | ICD-10-CM | POA: Diagnosis not present

## 2019-10-31 DIAGNOSIS — R7303 Prediabetes: Secondary | ICD-10-CM | POA: Diagnosis not present

## 2019-10-31 DIAGNOSIS — E785 Hyperlipidemia, unspecified: Secondary | ICD-10-CM | POA: Diagnosis not present

## 2019-10-31 DIAGNOSIS — Z96643 Presence of artificial hip joint, bilateral: Secondary | ICD-10-CM | POA: Diagnosis not present

## 2019-11-04 ENCOUNTER — Other Ambulatory Visit (HOSPITAL_COMMUNITY): Payer: Self-pay | Admitting: Internal Medicine

## 2019-11-04 DIAGNOSIS — Z13820 Encounter for screening for osteoporosis: Secondary | ICD-10-CM

## 2019-11-08 DIAGNOSIS — H20022 Recurrent acute iridocyclitis, left eye: Secondary | ICD-10-CM | POA: Diagnosis not present

## 2019-11-08 DIAGNOSIS — B0239 Other herpes zoster eye disease: Secondary | ICD-10-CM | POA: Diagnosis not present

## 2019-11-08 DIAGNOSIS — H16223 Keratoconjunctivitis sicca, not specified as Sjogren's, bilateral: Secondary | ICD-10-CM | POA: Diagnosis not present

## 2019-11-12 DIAGNOSIS — B0239 Other herpes zoster eye disease: Secondary | ICD-10-CM | POA: Diagnosis not present

## 2019-11-12 DIAGNOSIS — H20022 Recurrent acute iridocyclitis, left eye: Secondary | ICD-10-CM | POA: Diagnosis not present

## 2019-11-12 DIAGNOSIS — H16223 Keratoconjunctivitis sicca, not specified as Sjogren's, bilateral: Secondary | ICD-10-CM | POA: Diagnosis not present

## 2019-11-14 ENCOUNTER — Encounter (HOSPITAL_COMMUNITY): Payer: Self-pay

## 2019-11-14 ENCOUNTER — Encounter (HOSPITAL_COMMUNITY)
Admission: RE | Admit: 2019-11-14 | Discharge: 2019-11-14 | Disposition: A | Discharge status: Home / Self Care | Payer: Medicare Other | Source: Ambulatory Visit | Attending: Internal Medicine | Admitting: Internal Medicine

## 2019-11-14 ENCOUNTER — Other Ambulatory Visit: Payer: Self-pay

## 2019-11-14 DIAGNOSIS — K30 Functional dyspepsia: Secondary | ICD-10-CM | POA: Insufficient documentation

## 2019-11-14 DIAGNOSIS — Z7984 Long term (current) use of oral hypoglycemic drugs: Secondary | ICD-10-CM | POA: Diagnosis not present

## 2019-11-14 MED ORDER — DENOSUMAB 60 MG/ML ~~LOC~~ SOSY
60.0000 mg | PREFILLED_SYRINGE | Freq: Once | SUBCUTANEOUS | Status: AC
  Administered 2019-11-14: 60 mg via SUBCUTANEOUS

## 2019-11-25 ENCOUNTER — Telehealth: Payer: Self-pay | Admitting: Podiatry

## 2019-11-25 NOTE — Telephone Encounter (Signed)
Pt needs a refill of terb 3% fluc 2% tearee 5% ura 10%. To [facility].  Please call in today, pt is out.

## 2019-11-28 ENCOUNTER — Telehealth: Payer: Self-pay

## 2019-11-28 DIAGNOSIS — L209 Atopic dermatitis, unspecified: Secondary | ICD-10-CM | POA: Diagnosis not present

## 2019-11-28 DIAGNOSIS — B0222 Postherpetic trigeminal neuralgia: Secondary | ICD-10-CM | POA: Diagnosis not present

## 2019-11-28 DIAGNOSIS — R7303 Prediabetes: Secondary | ICD-10-CM | POA: Diagnosis not present

## 2019-11-28 DIAGNOSIS — I10 Essential (primary) hypertension: Secondary | ICD-10-CM | POA: Diagnosis not present

## 2019-11-28 DIAGNOSIS — E785 Hyperlipidemia, unspecified: Secondary | ICD-10-CM | POA: Diagnosis not present

## 2019-11-28 DIAGNOSIS — Z712 Person consulting for explanation of examination or test findings: Secondary | ICD-10-CM | POA: Diagnosis not present

## 2019-11-28 DIAGNOSIS — M810 Age-related osteoporosis without current pathological fracture: Secondary | ICD-10-CM | POA: Diagnosis not present

## 2019-11-28 DIAGNOSIS — K219 Gastro-esophageal reflux disease without esophagitis: Secondary | ICD-10-CM | POA: Diagnosis not present

## 2019-11-28 DIAGNOSIS — Z96643 Presence of artificial hip joint, bilateral: Secondary | ICD-10-CM | POA: Diagnosis not present

## 2019-11-28 NOTE — Telephone Encounter (Signed)
Error

## 2019-12-09 DIAGNOSIS — Z23 Encounter for immunization: Secondary | ICD-10-CM | POA: Diagnosis not present

## 2019-12-11 NOTE — Telephone Encounter (Signed)
Please advise on refill, not seen since July 9th 2021,last refill 2019 by Dr. Ardelle Anton

## 2019-12-13 DIAGNOSIS — H20022 Recurrent acute iridocyclitis, left eye: Secondary | ICD-10-CM | POA: Diagnosis not present

## 2019-12-13 DIAGNOSIS — B0239 Other herpes zoster eye disease: Secondary | ICD-10-CM | POA: Diagnosis not present

## 2019-12-17 DIAGNOSIS — B0239 Other herpes zoster eye disease: Secondary | ICD-10-CM | POA: Diagnosis not present

## 2019-12-17 DIAGNOSIS — H20022 Recurrent acute iridocyclitis, left eye: Secondary | ICD-10-CM | POA: Diagnosis not present

## 2020-01-03 ENCOUNTER — Other Ambulatory Visit: Payer: Self-pay

## 2020-01-03 ENCOUNTER — Encounter: Payer: Self-pay | Admitting: Podiatry

## 2020-01-03 ENCOUNTER — Ambulatory Visit (INDEPENDENT_AMBULATORY_CARE_PROVIDER_SITE_OTHER): Payer: Medicare Other | Admitting: Podiatry

## 2020-01-03 DIAGNOSIS — M79674 Pain in right toe(s): Secondary | ICD-10-CM | POA: Diagnosis not present

## 2020-01-03 DIAGNOSIS — B351 Tinea unguium: Secondary | ICD-10-CM | POA: Diagnosis not present

## 2020-01-03 DIAGNOSIS — M79675 Pain in left toe(s): Secondary | ICD-10-CM

## 2020-01-03 NOTE — Progress Notes (Signed)
This patient presents to the office with chief complaint of long thick painful nails.  Patient says the nails are painful walking and wearing shoes.  This patient is unable to self treat.  This patient is unable to trim her nails since she is unable to reach her nails.  She presents to the office for preventative foot care services.  General Appearance  Alert, conversant and in no acute stress.  Vascular  Dorsalis pedis and posterior tibial  pulses are palpable  bilaterally.  Capillary return is within normal limits  bilaterally. Temperature is within normal limits  bilaterally.  Neurologic  Senn-Weinstein monofilament wire test within normal limits  bilaterally. Muscle power within normal limits bilaterally.  Nails Thick disfigured discolored nails with subungual debris  from hallux to fifth toes bilaterally. No evidence of bacterial infection or drainage bilaterally.  Orthopedic  No limitations of motion  feet .  No crepitus or effusions noted.  No bony pathology or digital deformities noted.  DJD midfoot  B/L.  Forefoot equinus  B/L.  Skin  normotropic skin with no porokeratosis noted bilaterally.  No signs of infections or ulcers noted.     Onychomycosis  Nails  B/L.  Pain in right toes  Pain in left toes  Debridement of nails with nail nipper followed trimming the nails with dremel tool.    RTC 3 months. Told her to continue using her OTC fungal medicine.   Helane Gunther DPM

## 2020-01-09 DIAGNOSIS — B0222 Postherpetic trigeminal neuralgia: Secondary | ICD-10-CM | POA: Diagnosis not present

## 2020-01-09 DIAGNOSIS — M810 Age-related osteoporosis without current pathological fracture: Secondary | ICD-10-CM | POA: Diagnosis not present

## 2020-01-09 DIAGNOSIS — Z96643 Presence of artificial hip joint, bilateral: Secondary | ICD-10-CM | POA: Diagnosis not present

## 2020-01-09 DIAGNOSIS — L209 Atopic dermatitis, unspecified: Secondary | ICD-10-CM | POA: Diagnosis not present

## 2020-01-09 DIAGNOSIS — I10 Essential (primary) hypertension: Secondary | ICD-10-CM | POA: Diagnosis not present

## 2020-01-09 DIAGNOSIS — K219 Gastro-esophageal reflux disease without esophagitis: Secondary | ICD-10-CM | POA: Diagnosis not present

## 2020-01-09 DIAGNOSIS — R7303 Prediabetes: Secondary | ICD-10-CM | POA: Diagnosis not present

## 2020-01-09 DIAGNOSIS — E785 Hyperlipidemia, unspecified: Secondary | ICD-10-CM | POA: Diagnosis not present

## 2020-01-09 DIAGNOSIS — Z712 Person consulting for explanation of examination or test findings: Secondary | ICD-10-CM | POA: Diagnosis not present

## 2020-01-30 ENCOUNTER — Other Ambulatory Visit: Payer: Self-pay

## 2020-01-30 ENCOUNTER — Other Ambulatory Visit: Payer: Self-pay | Admitting: Student

## 2020-01-30 MED ORDER — HYDROCHLOROTHIAZIDE 25 MG PO TABS: 25.0000 mg | ORAL_TABLET | Freq: Every day | ORAL | 2 refills | Status: DC

## 2020-01-30 MED ORDER — LOSARTAN POTASSIUM 100 MG PO TABS: 100.0000 mg | ORAL_TABLET | Freq: Every day | ORAL | 2 refills | Status: DC

## 2020-01-30 MED ORDER — EZETIMIBE 10 MG PO TABS: ORAL_TABLET | ORAL | 3 refills | Status: DC

## 2020-01-30 NOTE — Telephone Encounter (Signed)
Refilled zetia.

## 2020-03-10 DIAGNOSIS — H20022 Recurrent acute iridocyclitis, left eye: Secondary | ICD-10-CM | POA: Diagnosis not present

## 2020-03-10 DIAGNOSIS — H18513 Endothelial corneal dystrophy, bilateral: Secondary | ICD-10-CM | POA: Diagnosis not present

## 2020-03-10 DIAGNOSIS — H16223 Keratoconjunctivitis sicca, not specified as Sjogren's, bilateral: Secondary | ICD-10-CM | POA: Diagnosis not present

## 2020-03-10 DIAGNOSIS — B0239 Other herpes zoster eye disease: Secondary | ICD-10-CM | POA: Diagnosis not present

## 2020-03-25 DIAGNOSIS — R0982 Postnasal drip: Secondary | ICD-10-CM | POA: Diagnosis not present

## 2020-03-25 DIAGNOSIS — R490 Dysphonia: Secondary | ICD-10-CM | POA: Diagnosis not present

## 2020-04-03 ENCOUNTER — Other Ambulatory Visit: Payer: Self-pay

## 2020-04-03 ENCOUNTER — Ambulatory Visit (INDEPENDENT_AMBULATORY_CARE_PROVIDER_SITE_OTHER): Payer: Medicare Other | Admitting: Podiatry

## 2020-04-03 DIAGNOSIS — M79674 Pain in right toe(s): Secondary | ICD-10-CM | POA: Diagnosis not present

## 2020-04-03 DIAGNOSIS — M79675 Pain in left toe(s): Secondary | ICD-10-CM

## 2020-04-03 DIAGNOSIS — B351 Tinea unguium: Secondary | ICD-10-CM | POA: Diagnosis not present

## 2020-04-03 NOTE — Progress Notes (Signed)
  Subjective:  Patient ID: [name], female    DOB: [birth year],  MRN: [id number]  Chief Complaint  Patient presents with  . Nail Problem    Left 1st medial painful, nail trim 1-5 bilateral    87 y.o. female presents with the above complaint. History confirmed with patient.  Objective:  Physical Exam: warm, good capillary refill, no trophic changes or ulcerative lesions, normal DP and PT pulses and normal sensory exam.  Painful ingrowing nail at  medial border of the left, hallux; without warmth, erythema or drainage. Other nails thickened with POP.  Assessment:   1. Pain due to onychomycosis of toenails of both feet      Plan:  Patient was evaluated and treated and all questions answered.  Ingrown Nail, left -Nail gently debrided in slant back fashion. Other nails debrided 2/2 pain.   Procedure: Nail Debridement Type of Debridement: manual, sharp debridement. Instrumentation: Nail nipper, rotary burr. Number of Nails: 10    No follow-ups on file.   MDM

## 2020-04-08 ENCOUNTER — Other Ambulatory Visit (HOSPITAL_COMMUNITY): Payer: Self-pay | Admitting: Internal Medicine

## 2020-04-08 ENCOUNTER — Ambulatory Visit: Payer: Medicare Other | Admitting: Podiatry

## 2020-04-08 DIAGNOSIS — Z1231 Encounter for screening mammogram for malignant neoplasm of breast: Secondary | ICD-10-CM

## 2020-04-10 ENCOUNTER — Ambulatory Visit: Payer: Medicare Other | Admitting: Podiatry

## 2020-04-17 ENCOUNTER — Ambulatory Visit: Payer: Medicare Other | Admitting: Podiatry

## 2020-04-23 ENCOUNTER — Other Ambulatory Visit: Payer: Self-pay

## 2020-04-23 ENCOUNTER — Ambulatory Visit (HOSPITAL_COMMUNITY)
Admission: RE | Admit: 2020-04-23 | Discharge: 2020-04-23 | Disposition: A | Discharge status: Home / Self Care | Payer: Medicare Other | Source: Ambulatory Visit | Attending: Internal Medicine | Admitting: Internal Medicine

## 2020-04-23 ENCOUNTER — Encounter (HOSPITAL_COMMUNITY): Payer: Self-pay

## 2020-04-23 DIAGNOSIS — Z1231 Encounter for screening mammogram for malignant neoplasm of breast: Secondary | ICD-10-CM | POA: Insufficient documentation

## 2020-04-24 DIAGNOSIS — L209 Atopic dermatitis, unspecified: Secondary | ICD-10-CM | POA: Diagnosis not present

## 2020-04-24 DIAGNOSIS — B0222 Postherpetic trigeminal neuralgia: Secondary | ICD-10-CM | POA: Diagnosis not present

## 2020-04-24 DIAGNOSIS — I10 Essential (primary) hypertension: Secondary | ICD-10-CM | POA: Diagnosis not present

## 2020-04-24 DIAGNOSIS — E785 Hyperlipidemia, unspecified: Secondary | ICD-10-CM | POA: Diagnosis not present

## 2020-04-24 DIAGNOSIS — K219 Gastro-esophageal reflux disease without esophagitis: Secondary | ICD-10-CM | POA: Diagnosis not present

## 2020-04-24 DIAGNOSIS — Z0001 Encounter for general adult medical examination with abnormal findings: Secondary | ICD-10-CM | POA: Diagnosis not present

## 2020-04-24 DIAGNOSIS — Z96643 Presence of artificial hip joint, bilateral: Secondary | ICD-10-CM | POA: Diagnosis not present

## 2020-04-24 DIAGNOSIS — R0982 Postnasal drip: Secondary | ICD-10-CM | POA: Diagnosis not present

## 2020-04-24 DIAGNOSIS — M810 Age-related osteoporosis without current pathological fracture: Secondary | ICD-10-CM | POA: Diagnosis not present

## 2020-04-24 DIAGNOSIS — R7303 Prediabetes: Secondary | ICD-10-CM | POA: Diagnosis not present

## 2020-04-24 DIAGNOSIS — Z712 Person consulting for explanation of examination or test findings: Secondary | ICD-10-CM | POA: Diagnosis not present

## 2020-04-24 DIAGNOSIS — R490 Dysphonia: Secondary | ICD-10-CM | POA: Diagnosis not present

## 2020-04-29 DIAGNOSIS — B351 Tinea unguium: Secondary | ICD-10-CM | POA: Diagnosis not present

## 2020-04-29 DIAGNOSIS — R944 Abnormal results of kidney function studies: Secondary | ICD-10-CM | POA: Diagnosis not present

## 2020-04-29 DIAGNOSIS — K219 Gastro-esophageal reflux disease without esophagitis: Secondary | ICD-10-CM | POA: Diagnosis not present

## 2020-04-29 DIAGNOSIS — L209 Atopic dermatitis, unspecified: Secondary | ICD-10-CM | POA: Diagnosis not present

## 2020-04-29 DIAGNOSIS — M810 Age-related osteoporosis without current pathological fracture: Secondary | ICD-10-CM | POA: Diagnosis not present

## 2020-04-29 DIAGNOSIS — H20012 Primary iridocyclitis, left eye: Secondary | ICD-10-CM | POA: Diagnosis not present

## 2020-04-29 DIAGNOSIS — B0222 Postherpetic trigeminal neuralgia: Secondary | ICD-10-CM | POA: Diagnosis not present

## 2020-04-29 DIAGNOSIS — I10 Essential (primary) hypertension: Secondary | ICD-10-CM | POA: Diagnosis not present

## 2020-04-29 DIAGNOSIS — Z96643 Presence of artificial hip joint, bilateral: Secondary | ICD-10-CM | POA: Diagnosis not present

## 2020-05-26 DIAGNOSIS — R7303 Prediabetes: Secondary | ICD-10-CM | POA: Diagnosis not present

## 2020-05-26 DIAGNOSIS — B351 Tinea unguium: Secondary | ICD-10-CM | POA: Diagnosis not present

## 2020-05-26 DIAGNOSIS — R490 Dysphonia: Secondary | ICD-10-CM | POA: Diagnosis not present

## 2020-05-26 DIAGNOSIS — B0222 Postherpetic trigeminal neuralgia: Secondary | ICD-10-CM | POA: Diagnosis not present

## 2020-05-26 DIAGNOSIS — H20012 Primary iridocyclitis, left eye: Secondary | ICD-10-CM | POA: Diagnosis not present

## 2020-05-26 DIAGNOSIS — I10 Essential (primary) hypertension: Secondary | ICD-10-CM | POA: Diagnosis not present

## 2020-05-26 DIAGNOSIS — E785 Hyperlipidemia, unspecified: Secondary | ICD-10-CM | POA: Diagnosis not present

## 2020-05-26 DIAGNOSIS — R944 Abnormal results of kidney function studies: Secondary | ICD-10-CM | POA: Diagnosis not present

## 2020-05-26 DIAGNOSIS — R0982 Postnasal drip: Secondary | ICD-10-CM | POA: Diagnosis not present

## 2020-05-26 DIAGNOSIS — M810 Age-related osteoporosis without current pathological fracture: Secondary | ICD-10-CM | POA: Diagnosis not present

## 2020-05-26 DIAGNOSIS — Z96643 Presence of artificial hip joint, bilateral: Secondary | ICD-10-CM | POA: Diagnosis not present

## 2020-05-26 DIAGNOSIS — K219 Gastro-esophageal reflux disease without esophagitis: Secondary | ICD-10-CM | POA: Diagnosis not present

## 2020-05-29 ENCOUNTER — Other Ambulatory Visit: Payer: Self-pay

## 2020-05-29 ENCOUNTER — Encounter (HOSPITAL_COMMUNITY)
Admission: RE | Admit: 2020-05-29 | Discharge: 2020-05-29 | Disposition: A | Discharge status: Home / Self Care | Payer: Medicare Other | Source: Ambulatory Visit | Attending: Internal Medicine | Admitting: Internal Medicine

## 2020-05-29 ENCOUNTER — Encounter (HOSPITAL_COMMUNITY): Payer: Self-pay

## 2020-05-29 DIAGNOSIS — M810 Age-related osteoporosis without current pathological fracture: Secondary | ICD-10-CM | POA: Diagnosis not present

## 2020-05-29 MED ORDER — DENOSUMAB 60 MG/ML ~~LOC~~ SOSY
PREFILLED_SYRINGE | SUBCUTANEOUS | Status: AC
  Administered 2020-05-29: 60 mg via SUBCUTANEOUS
  Filled 2020-05-29: qty 1

## 2020-05-29 MED ORDER — DENOSUMAB 60 MG/ML ~~LOC~~ SOSY: 60.0000 mg | PREFILLED_SYRINGE | Freq: Once | SUBCUTANEOUS | Status: AC

## 2020-06-09 ENCOUNTER — Ambulatory Visit (HOSPITAL_COMMUNITY)
Admission: RE | Admit: 2020-06-09 | Discharge: 2020-06-09 | Disposition: A | Discharge status: Home / Self Care | Payer: Medicare Other | Source: Ambulatory Visit | Attending: Internal Medicine | Admitting: Internal Medicine

## 2020-06-09 DIAGNOSIS — Z780 Asymptomatic menopausal state: Secondary | ICD-10-CM | POA: Diagnosis not present

## 2020-06-09 DIAGNOSIS — Z13820 Encounter for screening for osteoporosis: Secondary | ICD-10-CM | POA: Diagnosis not present

## 2020-06-09 DIAGNOSIS — M8588 Other specified disorders of bone density and structure, other site: Secondary | ICD-10-CM | POA: Diagnosis not present

## 2020-06-10 DIAGNOSIS — Z96643 Presence of artificial hip joint, bilateral: Secondary | ICD-10-CM | POA: Diagnosis not present

## 2020-06-10 DIAGNOSIS — L209 Atopic dermatitis, unspecified: Secondary | ICD-10-CM | POA: Diagnosis not present

## 2020-06-10 DIAGNOSIS — Z712 Person consulting for explanation of examination or test findings: Secondary | ICD-10-CM | POA: Diagnosis not present

## 2020-06-10 DIAGNOSIS — K219 Gastro-esophageal reflux disease without esophagitis: Secondary | ICD-10-CM | POA: Diagnosis not present

## 2020-06-10 DIAGNOSIS — I10 Essential (primary) hypertension: Secondary | ICD-10-CM | POA: Diagnosis not present

## 2020-06-10 DIAGNOSIS — M810 Age-related osteoporosis without current pathological fracture: Secondary | ICD-10-CM | POA: Diagnosis not present

## 2020-06-10 DIAGNOSIS — E785 Hyperlipidemia, unspecified: Secondary | ICD-10-CM | POA: Diagnosis not present

## 2020-06-11 DIAGNOSIS — H16223 Keratoconjunctivitis sicca, not specified as Sjogren's, bilateral: Secondary | ICD-10-CM | POA: Diagnosis not present

## 2020-06-11 DIAGNOSIS — H18513 Endothelial corneal dystrophy, bilateral: Secondary | ICD-10-CM | POA: Diagnosis not present

## 2020-06-11 DIAGNOSIS — B0239 Other herpes zoster eye disease: Secondary | ICD-10-CM | POA: Diagnosis not present

## 2020-06-11 DIAGNOSIS — H20022 Recurrent acute iridocyclitis, left eye: Secondary | ICD-10-CM | POA: Diagnosis not present

## 2020-06-18 DIAGNOSIS — B0239 Other herpes zoster eye disease: Secondary | ICD-10-CM | POA: Diagnosis not present

## 2020-06-18 DIAGNOSIS — H20022 Recurrent acute iridocyclitis, left eye: Secondary | ICD-10-CM | POA: Diagnosis not present

## 2020-06-18 DIAGNOSIS — H18513 Endothelial corneal dystrophy, bilateral: Secondary | ICD-10-CM | POA: Diagnosis not present

## 2020-06-18 DIAGNOSIS — H16223 Keratoconjunctivitis sicca, not specified as Sjogren's, bilateral: Secondary | ICD-10-CM | POA: Diagnosis not present

## 2020-06-30 ENCOUNTER — Encounter: Payer: Self-pay | Admitting: Cardiology

## 2020-06-30 ENCOUNTER — Ambulatory Visit (INDEPENDENT_AMBULATORY_CARE_PROVIDER_SITE_OTHER): Payer: Medicare Other | Admitting: Cardiology

## 2020-06-30 VITALS — BP 128/70 | HR 60 | Ht 61.5 in | Wt 145.0 lb

## 2020-06-30 DIAGNOSIS — E782 Mixed hyperlipidemia: Secondary | ICD-10-CM | POA: Diagnosis not present

## 2020-06-30 DIAGNOSIS — Z8679 Personal history of other diseases of the circulatory system: Secondary | ICD-10-CM | POA: Diagnosis not present

## 2020-06-30 DIAGNOSIS — I10 Essential (primary) hypertension: Secondary | ICD-10-CM

## 2020-06-30 NOTE — Progress Notes (Signed)
Cardiology Office Note  Date: 06/30/2020   ID: [name], DOB 01/26/1934, MRN [id number]  PCP:  Benita Stabile, MD  Cardiologist:  Nona Dell, MD Electrophysiologist:  None   Chief Complaint  Patient presents with  . Cardiac follow-up    History of Present Illness: [name] is an 87 y.o. female former patient of Dr. Purvis Sheffield now presenting to establish follow-up with me.  I reviewed her records and updated the chart.  She was last seen in April 2021 by Dr. Purvis Sheffield.  She presents today reporting no exertional symptomatology.  She and her husband (married 62 years) remain active.  We went over her medications.  She was most recently taken off HCTZ by Dr. Margo Aye, I asked her to also stop her potassium supplement as she is on no other diuretics.  Antihypertensive regimen includes Cozaar and Coreg otherwise.  Today's blood pressure is well controlled.  I personally reviewed her ECG today which shows sinus rhythm with prolonged PR interval, small R' in lead V1 with borderline increased voltage.  Past Medical History:  Diagnosis Date  . Fibrocystic breast disease   . GERD (gastroesophageal reflux disease)   . History of shingles   . Humerus fracture    Right  . Hypertension   . Mixed hyperlipidemia   . Renal artery stenosis (HCC) 1984   Status post angioplasty bilaterally    Past Surgical History:  Procedure Laterality Date  . ABDOMINAL HYSTERECTOMY    . BELPHAROPTOSIS REPAIR    . breast biopsies     for benign lumps  . BREAST LUMPECTOMY  1985  . CATARACT EXTRACTION W/PHACO Left 08/22/2016   Procedure: CATARACT EXTRACTION PHACO AND INTRAOCULAR LENS PLACEMENT (IOC);  Surgeon: Gemma Payor, MD;  Location: [facility];  Service: Ophthalmology;  Laterality: Left;  CDE: 8.63  . CATARACT EXTRACTION W/PHACO Right 09/19/2016   Procedure: CATARACT EXTRACTION PHACO AND INTRAOCULAR LENS PLACEMENT (IOC);  Surgeon: Gemma Payor, MD;  Location: [facility];  Service: Ophthalmology;   Laterality: Right;  CDE: 9.96  . COLONOSCOPY    . RENAL ARTERY ANGIOPLASTY  1987   right Dr. Jean Rosenthal  . REVERSE SHOULDER ARTHROPLASTY Right 09/23/2016   Procedure: REVERSE SHOULDER ARTHROPLASTY;  Surgeon: Beverely Low, MD;  [facility];  Service: Orthopedics;  Laterality: Right;  2 hrs  . TONSILLECTOMY    . TONSILLECTOMY    . TOTAL HIP ARTHROPLASTY     right  . VEIN LIGATION  1974   both legs  . WRIST FRACTURE SURGERY Right 2002  . WRIST FRACTURE SURGERY Left 2017    Current Outpatient Medications  Medication Sig Dispense Refill  . Ascorbic Acid (VITAMIN C) 100 MG tablet Take by mouth.    . Biotin 5000 MCG CAPS Take 5,000 mcg by mouth daily.    . calcium citrate-vitamin D (CITRACAL+D) 315-200 MG-UNIT tablet Take 1 tablet by mouth daily.    . carvedilol (COREG) 12.5 MG tablet TAKE ONE TABLET (12.5MG  TOTAL) BY MOUTH TWO TIMES DAILY 180 tablet 3  . clobetasol ointment (TEMOVATE) 0.05 % Apply topically.    Marland Kitchen denosumab (PROLIA) 60 MG/ML SOSY injection Prolia 60 mg/mL subcutaneous syringe    . Emollient (CERAVE) LOTN Apply 1 application topically 3 (three) times daily as needed (itching).    . ezetimibe (ZETIA) 10 MG tablet TAKE ONE (1) TABLET BY MOUTH EVERY DAY 90 tablet 3  . fluocinolone (SYNALAR) 0.025 % ointment prn as needed    . gabapentin (NEURONTIN) 300 MG capsule  at bedtime.     . INVELTYS 1 % SUSP     . losartan (COZAAR) 100 MG tablet Take 1 tablet (100 mg total) by mouth daily. 90 tablet 2  . NON FORMULARY [facility]: Terbinafine 3%,Fluconazole 2%, Tea Tree Oil 5%, Urea 10%,Ibuprofen 2% in DMSO    . omeprazole (PRILOSEC OTC) 20 MG tablet Take 20 mg by mouth daily.    . potassium chloride (KLOR-CON) 10 MEQ tablet TAKE ONE (1) TABLET BY MOUTH EVERY DAY 90 tablet 1  . prednisoLONE acetate (PRED FORTE) 1 % ophthalmic suspension     . valACYclovir (VALTREX) 1000 MG tablet      No current facility-administered medications for this visit.   Allergies:  Baycol  [cerivastatin sodium], Crestor [rosuvastatin calcium], Lipitor [atorvastatin calcium], Morphine and related, Pravachol, Propylene glycol, Balsam peru-castor [balsam peru-castor oil], Ciprofloxacin, Diphenhydramine hcl, Methyldibromoglutaronitrile, and Other   ROS: No palpitations or syncope.  Physical Exam: VS:  BP 128/70   Pulse 60   Ht 5' 1.5" (1.562 m)   Wt 145 lb (65.8 kg)   SpO2 94%   BMI 26.95 kg/m , BMI Body mass index is 26.95 kg/m.  Wt Readings from Last 3 Encounters:  06/30/20 145 lb (65.8 kg)  11/14/19 147 lb 11.3 oz (67 kg)  06/13/19 148 lb 9.6 oz (67.4 kg)    General: Patient appears comfortable at rest. HEENT: Conjunctiva and lids normal, wearing a mask. Neck: Supple, no elevated JVP or carotid bruits, no thyromegaly. Lungs: Clear to auscultation, nonlabored breathing at rest. Cardiac: Regular rate and rhythm, no S3 or significant systolic murmur, no pericardial rub. Extremities: No pitting edema.  ECG:  An ECG dated 02/23/2018 was personally reviewed today and demonstrated:  Sinus rhythm with increased voltage, decreased R wave progression.  Recent Labwork:  February 2021: Hemoglobin 12.4, platelets 184, BUN 9, creatinine 0.85, potassium 4.2, AST 16, ALT 9, cholesterol 170, triglycerides 82, HDL 59, LDL 96, hemoglobin A1c 5.7%, TSH 2.63  Other Studies Reviewed Today:  No interval cardiac testing for review.  Assessment and Plan:  1.  Essential hypertension, blood pressure is well controlled today on Coreg and Cozaar.  No changes were made.  2.  History of renal artery stenosis status post angioplasty in the 1980s.  In the absence of any accelerating and difficulty control hypertension or worsening renal function, no clear indication for repeat imaging at this time.  3.  Mixed hyperlipidemia, on Zetia with follow-up by Dr. Margo Aye.  Medication Adjustments/Labs and Tests Ordered: Current medicines are reviewed at length with the patient today.  Concerns  regarding medicines are outlined above.   Tests Ordered: Orders Placed This Encounter  Procedures  . EKG 12-Lead    Medication Changes: No orders of the defined types were placed in this encounter.   Disposition:  Follow up 1 year in the [place] office.  Signed, Jonelle Sidle, MD, [facility] 06/30/2020 11:38 AM    [facility] at [facility] [address] [place], [place], [place] [address] Phone: [phone]; Fax: [phone]

## 2020-06-30 NOTE — Patient Instructions (Addendum)
Medication Instructions:   Your physician has recommended you make the following change in your medication:   Stop potassium  Continue other medications the same  Labwork:  none  Testing/Procedures:  none  Follow-Up:  Your physician recommends that you schedule a follow-up appointment in: 1 year at the [place] office. You will receive a reminder letter in the mail in about 10 months reminding you to call and schedule your appointment. If you don't receive this letter, please contact our office.  Any Other Special Instructions Will Be Listed Below (If Applicable).  If you need a refill on your cardiac medications before your next appointment, please call your pharmacy.

## 2020-07-03 ENCOUNTER — Ambulatory Visit: Payer: Medicare Other | Admitting: Podiatry

## 2020-07-07 ENCOUNTER — Other Ambulatory Visit: Payer: Self-pay

## 2020-07-07 ENCOUNTER — Ambulatory Visit: Payer: Medicare Other | Admitting: Podiatry

## 2020-07-13 ENCOUNTER — Ambulatory Visit (INDEPENDENT_AMBULATORY_CARE_PROVIDER_SITE_OTHER): Payer: Medicare Other | Admitting: Podiatry

## 2020-07-13 ENCOUNTER — Encounter: Payer: Self-pay | Admitting: Podiatry

## 2020-07-13 ENCOUNTER — Other Ambulatory Visit: Payer: Self-pay

## 2020-07-13 DIAGNOSIS — B351 Tinea unguium: Secondary | ICD-10-CM

## 2020-07-13 DIAGNOSIS — M79675 Pain in left toe(s): Secondary | ICD-10-CM

## 2020-07-13 DIAGNOSIS — M79674 Pain in right toe(s): Secondary | ICD-10-CM

## 2020-07-13 NOTE — Progress Notes (Signed)
This patient presents to the office with chief complaint of long thick painful nails.  Patient says the nails are painful walking and wearing shoes.  This patient is unable to self treat.  This patient is unable to trim her nails since she is unable to reach her nails.  She presents to the office for preventative foot care services.  General Appearance  Alert, conversant and in no acute stress.  Vascular  Dorsalis pedis and posterior tibial  pulses are palpable  bilaterally.  Capillary return is within normal limits  bilaterally. Temperature is within normal limits  bilaterally.  Neurologic  Senn-Weinstein monofilament wire test within normal limits  bilaterally. Muscle power within normal limits bilaterally.  Nails Thick disfigured discolored nails with subungual debris  from hallux to fifth toes bilaterally. No evidence of bacterial infection or drainage bilaterally.  Orthopedic  No limitations of motion  feet .  No crepitus or effusions noted.  No bony pathology or digital deformities noted.  DJD midfoot  B/L.  Forefoot equinus  B/L.  Skin  normotropic skin with no porokeratosis noted bilaterally.  No signs of infections or ulcers noted.     Onychomycosis  Nails  B/L.  Pain in right toes  Pain in left toes  Debridement of nails with nail nipper followed trimming the nails with dremel tool.    RTC 3 months.    Helane Gunther DPM

## 2020-07-22 DIAGNOSIS — H16223 Keratoconjunctivitis sicca, not specified as Sjogren's, bilateral: Secondary | ICD-10-CM | POA: Diagnosis not present

## 2020-07-22 DIAGNOSIS — H20022 Recurrent acute iridocyclitis, left eye: Secondary | ICD-10-CM | POA: Diagnosis not present

## 2020-07-22 DIAGNOSIS — H18513 Endothelial corneal dystrophy, bilateral: Secondary | ICD-10-CM | POA: Diagnosis not present

## 2020-07-22 DIAGNOSIS — B0239 Other herpes zoster eye disease: Secondary | ICD-10-CM | POA: Diagnosis not present

## 2020-07-30 ENCOUNTER — Other Ambulatory Visit: Payer: Self-pay | Admitting: Student

## 2020-08-12 DIAGNOSIS — U071 COVID-19: Secondary | ICD-10-CM | POA: Diagnosis not present

## 2020-08-17 ENCOUNTER — Ambulatory Visit
Admission: EM | Admit: 2020-08-17 | Discharge: 2020-08-17 | Disposition: A | Discharge status: Home / Self Care | Payer: Medicare Other | Attending: Family Medicine | Admitting: Family Medicine

## 2020-08-17 ENCOUNTER — Other Ambulatory Visit: Payer: Self-pay

## 2020-08-17 DIAGNOSIS — R0981 Nasal congestion: Secondary | ICD-10-CM

## 2020-08-17 DIAGNOSIS — B349 Viral infection, unspecified: Secondary | ICD-10-CM

## 2020-08-17 DIAGNOSIS — R519 Headache, unspecified: Secondary | ICD-10-CM

## 2020-08-17 MED ORDER — DEXAMETHASONE SODIUM PHOSPHATE 10 MG/ML IJ SOLN
10.0000 mg | Freq: Once | INTRAMUSCULAR | Status: AC
  Administered 2020-08-17: 10 mg via INTRAMUSCULAR

## 2020-08-17 NOTE — Discharge Instructions (Signed)
Decadron given in the office today  Follow up with this office or with primary care if symptoms are persisting.  Follow up in the ER for high fever, trouble swallowing, trouble breathing, other concerning symptoms.

## 2020-08-17 NOTE — ED Triage Notes (Signed)
Pt presents on day 7 of COVID. Reports continued head congestion and pressure. Pt is requesting a steroid shot to help with the congestion.

## 2020-08-17 NOTE — ED Provider Notes (Signed)
[facility]    CSN: [id number] Arrival date & time: 08/17/20  1408      History   Chief Complaint Chief Complaint  Patient presents with  . Nasal Congestion    HPI [name] is a 87 y.o. female.   States that she is on day 7 after a positive Covid test at home. Reports that fatigue, cough, nasal congestion and mild SOB is lingering. Has not attempted OTC treatment at home. She is requesting a steroid injection to help with the sinus pressure and coughing. Reports that her husband is sick at home as well with the same symptoms and the he also had a positive Covid test x 6 days ago. Denies abdominal pain, nausea, vomiting, diarrhea, rash, fever, other symptoms. Has previous negative hx Covid. Has completed Covid vaccines and booster. Has completed flu vaccine.   ROS per HPI  The history is provided by the patient.    Past Medical History:  Diagnosis Date  . Fibrocystic breast disease   . GERD (gastroesophageal reflux disease)   . History of shingles   . Humerus fracture    Right  . Hypertension   . Mixed hyperlipidemia   . Renal artery stenosis ([place]) 1984   Status post angioplasty bilaterally    Patient Active Problem List   Diagnosis Date Noted  . Osteoarthritis of knee 04/16/2017  . Contact dermatitis 10/26/2016  . S/P shoulder replacement, right 09/23/2016  . History of renal artery stenosis 08/26/2015  . History of multiple allergies 10/25/2013  . Benign hypertensive heart disease without heart failure 08/06/2010  . Hypercholesterolemia 08/06/2010    Past Surgical History:  Procedure Laterality Date  . ABDOMINAL HYSTERECTOMY    . BELPHAROPTOSIS REPAIR    . breast biopsies     for benign lumps  . BREAST LUMPECTOMY  1985  . CATARACT EXTRACTION W/PHACO Left 08/22/2016   Procedure: CATARACT EXTRACTION PHACO AND INTRAOCULAR LENS PLACEMENT (IOC);  Surgeon: Tonny Branch, MD;  Location: [facility];  Service: Ophthalmology;  Laterality: Left;  CDE: 8.63  .  CATARACT EXTRACTION W/PHACO Right 09/19/2016   Procedure: CATARACT EXTRACTION PHACO AND INTRAOCULAR LENS PLACEMENT (IOC);  Surgeon: Tonny Branch, MD;  Location: [facility];  Service: Ophthalmology;  Laterality: Right;  CDE: 9.96  . COLONOSCOPY    . RENAL ARTERY ANGIOPLASTY  1987   right Dr. Glennon Mac  . REVERSE SHOULDER ARTHROPLASTY Right 09/23/2016   Procedure: REVERSE SHOULDER ARTHROPLASTY;  Surgeon: Netta Cedars, MD;  Location: [place];  Service: Orthopedics;  Laterality: Right;  2 hrs  . TONSILLECTOMY    . TONSILLECTOMY    . TOTAL HIP ARTHROPLASTY     right  . VEIN LIGATION  1974   both legs  . WRIST FRACTURE SURGERY Right 2002  . WRIST FRACTURE SURGERY Left 2017    OB History   No obstetric history on file.      Home Medications    Prior to Admission medications   Medication Sig Start Date End Date Taking? Authorizing Provider  Ascorbic Acid (VITAMIN C) 100 MG tablet Take by mouth.    [provider]  Biotin 5000 MCG CAPS Take 5,000 mcg by mouth daily.    [provider]  calcium citrate-vitamin D (CITRACAL+D) 315-200 MG-UNIT tablet Take 1 tablet by mouth daily.    [provider]  carvedilol (COREG) 12.5 MG tablet TAKE ONE TABLET (12.5MG TOTAL) BY MOUTH TWO TIMES DAILY 07/30/20   Satira Sark, MD  clobetasol ointment (TEMOVATE) 0.05 %  Apply topically. 05/08/17   [provider]  denosumab (PROLIA) 60 MG/ML SOSY injection Prolia 60 mg/mL subcutaneous syringe    [provider]  Emollient (CERAVE) LOTN Apply 1 application topically 3 (three) times daily as needed (itching).    [provider]  ezetimibe (ZETIA) 10 MG tablet TAKE ONE (1) TABLET BY MOUTH EVERY DAY 01/30/20   Ahmed Prima, [place] M, PA-C  fluocinolone (SYNALAR) 0.025 % ointment prn as needed 07/28/10   [provider]  gabapentin (NEURONTIN) 300 MG capsule at bedtime.  12/03/18   [provider]  INVELTYS 1 % SUSP  09/26/18   [provider]   losartan (COZAAR) 100 MG tablet Take 1 tablet (100 mg total) by mouth daily. 01/30/20   Strader, Fransisco Hertz, PA-C  NON FORMULARY [facility]: Terbinafine 3%,Fluconazole 2%, Tea Tree Oil 5%, Urea 10%,Ibuprofen 2% in DMSO    [provider]  omeprazole (PRILOSEC OTC) 20 MG tablet Take 20 mg by mouth daily.    [provider]  prednisoLONE acetate (PRED FORTE) 1 % ophthalmic suspension  08/01/18   [provider]  valACYclovir (VALTREX) 1000 MG tablet  07/19/18   [provider]    Family History Family History  Problem Relation Age of Onset  . Multiple myeloma Father   . Breast cancer Mother     Social History Social History   Tobacco Use  . Smoking status: Former Smoker    Quit date: 03/15/1987    Years since quitting: 33.4  . Smokeless tobacco: Never Used  Vaping Use  . Vaping Use: Never used  Substance Use Topics  . Alcohol use: Not Currently    Alcohol/week: 0.0 standard drinks    Comment: occasional  . Drug use: No     Allergies   Baycol [cerivastatin sodium], Crestor [rosuvastatin calcium], Lipitor [atorvastatin calcium], Morphine and related, Pravachol, Propylene glycol, Balsam peru-castor [balsam peru-castor oil], Ciprofloxacin, Diphenhydramine hcl, Methyldibromoglutaronitrile, and Other   Review of Systems Review of Systems   Physical Exam Triage Vital Signs ED Triage Vitals  Enc Vitals Group     BP 08/17/20 1459 124/75     Pulse Rate 08/17/20 1459 67     Resp 08/17/20 1459 14     Temp 08/17/20 1459 97.8 F (36.6 C)     Temp Source 08/17/20 1459 Tympanic     SpO2 08/17/20 1459 92 %     Weight --      Height --      Head Circumference --      Peak Flow --      Pain Score 08/17/20 1512 0     Pain Loc --      Pain Edu? --      Excl. in [place]? --    No data found.  Updated Vital Signs BP 124/75 (BP Location: Right Arm)   Pulse 67   Temp 97.8 F (36.6 C) (Tympanic)   Resp 14   SpO2 92%   Visual Acuity Right  Eye Distance:   Left Eye Distance:   Bilateral Distance:    Right Eye Near:   Left Eye Near:    Bilateral Near:     Physical Exam Vitals and nursing note reviewed.  Constitutional:      General: She is not in acute distress.    Appearance: Normal appearance. She is well-developed and normal weight. She is not ill-appearing.  HENT:     Head: Normocephalic and atraumatic.     Right Ear: Tympanic  membrane, ear canal and external ear normal.     Left Ear: Tympanic membrane, ear canal and external ear normal.     Nose: Congestion present.     Mouth/Throat:     Mouth: Mucous membranes are moist.     Pharynx: Posterior oropharyngeal erythema present.  Eyes:     Extraocular Movements: Extraocular movements intact.     Conjunctiva/sclera: Conjunctivae normal.     Pupils: Pupils are equal, round, and reactive to light.  Cardiovascular:     Rate and Rhythm: Normal rate and regular rhythm.     Heart sounds: Normal heart sounds. No murmur heard.   Pulmonary:     Effort: Pulmonary effort is normal. No respiratory distress.     Breath sounds: Normal breath sounds. No stridor. No wheezing, rhonchi or rales.  Chest:     Chest wall: No tenderness.  Abdominal:     General: There is no distension.     Palpations: Abdomen is soft. There is no mass.     Tenderness: There is no abdominal tenderness. There is no right CVA tenderness, left CVA tenderness, guarding or rebound.     Hernia: No hernia is present.  Musculoskeletal:        General: Normal range of motion.     Cervical back: Normal range of motion and neck supple.  Skin:    General: Skin is warm and dry.     Capillary Refill: Capillary refill takes less than 2 seconds.  Neurological:     General: No focal deficit present.     Mental Status: She is alert and oriented to person, place, and time.  Psychiatric:        Mood and Affect: Mood normal.        Behavior: Behavior normal.        Thought Content: Thought content normal.       UC Treatments / Results  Labs (all labs ordered are listed, but only abnormal results are displayed) Labs Reviewed - No data to display  EKG   Radiology No results found.  Procedures Procedures (including critical care time)  Medications Ordered in UC Medications  dexamethasone (DECADRON) injection 10 mg (10 mg Intramuscular Given 08/17/20 1529)    Initial Impression / Assessment and Plan / UC Course  I have reviewed the triage vital signs and the nursing notes.  Pertinent labs & imaging results that were available during my care of the patient were reviewed by me and considered in my medical decision making (see chart for details).    Viral Illness Nasal Congestion Headache  Decadron 60m IM in office today May take ibuprofen and tylenol as needed Push fluids and get plenty of rest Follow up with this office or with primary care if symptoms are persisting.  Follow up in the ER for high fever, trouble swallowing, trouble breathing, other concerning symptoms.   Final Clinical Impressions(s) / UC Diagnoses   Final diagnoses:  Nonintractable headache, unspecified chronicity pattern, unspecified headache type  Nasal congestion     Discharge Instructions     Decadron given in the office today  Follow up with this office or with primary care if symptoms are persisting.  Follow up in the ER for high fever, trouble swallowing, trouble breathing, other concerning symptoms.     ED Prescriptions    None     PDMP not reviewed this encounter.   MFaustino Congress NP 08/18/20 1345

## 2020-09-09 DIAGNOSIS — D692 Other nonthrombocytopenic purpura: Secondary | ICD-10-CM | POA: Diagnosis not present

## 2020-09-09 DIAGNOSIS — L2089 Other atopic dermatitis: Secondary | ICD-10-CM | POA: Diagnosis not present

## 2020-09-09 DIAGNOSIS — L239 Allergic contact dermatitis, unspecified cause: Secondary | ICD-10-CM | POA: Diagnosis not present

## 2020-09-10 DIAGNOSIS — I10 Essential (primary) hypertension: Secondary | ICD-10-CM | POA: Diagnosis not present

## 2020-09-10 DIAGNOSIS — E1165 Type 2 diabetes mellitus with hyperglycemia: Secondary | ICD-10-CM | POA: Diagnosis not present

## 2020-10-19 ENCOUNTER — Ambulatory Visit: Payer: Medicare Other | Admitting: Podiatry

## 2020-10-20 ENCOUNTER — Other Ambulatory Visit: Payer: Self-pay

## 2020-10-20 ENCOUNTER — Ambulatory Visit (INDEPENDENT_AMBULATORY_CARE_PROVIDER_SITE_OTHER): Payer: Medicare Other | Admitting: Podiatry

## 2020-10-20 ENCOUNTER — Encounter: Payer: Self-pay | Admitting: Podiatry

## 2020-10-20 DIAGNOSIS — H16223 Keratoconjunctivitis sicca, not specified as Sjogren's, bilateral: Secondary | ICD-10-CM | POA: Diagnosis not present

## 2020-10-20 DIAGNOSIS — B351 Tinea unguium: Secondary | ICD-10-CM | POA: Diagnosis not present

## 2020-10-20 DIAGNOSIS — M79674 Pain in right toe(s): Secondary | ICD-10-CM | POA: Diagnosis not present

## 2020-10-20 DIAGNOSIS — H18513 Endothelial corneal dystrophy, bilateral: Secondary | ICD-10-CM | POA: Diagnosis not present

## 2020-10-20 DIAGNOSIS — M79675 Pain in left toe(s): Secondary | ICD-10-CM

## 2020-10-20 DIAGNOSIS — B0239 Other herpes zoster eye disease: Secondary | ICD-10-CM | POA: Diagnosis not present

## 2020-10-20 NOTE — Progress Notes (Signed)
This patient presents to the office with chief complaint of long thick painful nails.  Patient says the nails are painful walking and wearing shoes.  This patient is unable to self treat.  This patient is unable to trim her nails since she is unable to reach her nails.  She presents to the office for preventative foot care services.  General Appearance  Alert, conversant and in no acute stress.  Vascular  Dorsalis pedis and posterior tibial  pulses are palpable  bilaterally.  Capillary return is within normal limits  bilaterally. Temperature is within normal limits  bilaterally.  Neurologic  Senn-Weinstein monofilament wire test within normal limits  bilaterally. Muscle power within normal limits bilaterally.  Nails Thick disfigured discolored nails with subungual debris  from hallux to fifth toes bilaterally. No evidence of bacterial infection or drainage bilaterally.  Orthopedic  No limitations of motion  feet .  No crepitus or effusions noted.  No bony pathology or digital deformities noted.  DJD midfoot  B/L.  Forefoot equinus  B/L.  Skin  normotropic skin with no porokeratosis noted bilaterally.  No signs of infections or ulcers noted.     Onychomycosis  Nails  B/L.  Pain in right toes  Pain in left toes  Debridement of nails with nail nipper followed trimming the nails with dremel tool.    RTC 3 months.    Kourtnie Sachs DPM  

## 2020-10-27 DIAGNOSIS — Z87891 Personal history of nicotine dependence: Secondary | ICD-10-CM | POA: Diagnosis not present

## 2020-10-27 DIAGNOSIS — L282 Other prurigo: Secondary | ICD-10-CM | POA: Diagnosis not present

## 2020-10-27 DIAGNOSIS — L209 Atopic dermatitis, unspecified: Secondary | ICD-10-CM | POA: Diagnosis not present

## 2020-10-27 DIAGNOSIS — L309 Dermatitis, unspecified: Secondary | ICD-10-CM | POA: Diagnosis not present

## 2020-10-28 ENCOUNTER — Encounter: Payer: Self-pay | Admitting: Emergency Medicine

## 2020-10-28 ENCOUNTER — Other Ambulatory Visit: Payer: Self-pay

## 2020-10-28 ENCOUNTER — Ambulatory Visit
Admission: EM | Admit: 2020-10-28 | Discharge: 2020-10-28 | Disposition: A | Discharge status: Home / Self Care | Payer: Medicare Other | Attending: Family Medicine | Admitting: Family Medicine

## 2020-10-28 DIAGNOSIS — J0100 Acute maxillary sinusitis, unspecified: Secondary | ICD-10-CM | POA: Diagnosis not present

## 2020-10-28 MED ORDER — AMOXICILLIN 875 MG PO TABS: 875.0000 mg | ORAL_TABLET | Freq: Two times a day (BID) | ORAL | 0 refills | Status: AC

## 2020-10-28 NOTE — ED Triage Notes (Signed)
Pt here with sinus pressure and pain for about 10 days. States she has post-nasal drip and has taken multiple covid tests, but they are negative. States green drainage when she can blow any out

## 2020-10-28 NOTE — ED Provider Notes (Signed)
[place]   [id number] 10/28/20 Arrival Time: 1950  ASSESSMENT & PLAN:  1. Acute non-recurrent maxillary sinusitis    Begin: Meds ordered this encounter  Medications   amoxicillin (AMOXIL) 875 MG tablet    Sig: Take 1 tablet (875 mg total) by mouth 2 (two) times daily for 10 days.    Dispense:  20 tablet    Refill:  0   Discussed typical duration of symptoms. OTC symptom care as needed. Ensure adequate fluid intake and rest.   Follow-up Information     Celene Squibb, MD.   Specialty: Internal Medicine Why: As needed. Contact information: [facility] [address] [phone]                 Reviewed expectations re: course of current medical issues. Questions answered. Outlined signs and symptoms indicating need for more acute intervention. Patient verbalized understanding. After Visit Summary given.   SUBJECTIVE: History from: patient.  DANIJA GOSA is a 87 y.o. female who presents with complaint of nasal congestion, post-nasal drainage, and sinus pain. Onset gradual,  1.5 w ago . Respiratory symptoms: none. Fever: absent. Overall normal PO intake without n/v. OTC treatment: none. Seasonal allergies: no. No specific aggravating or alleviating factors reported.  Social History   Tobacco Use  Smoking Status Former   Types: Cigarettes   Quit date: 03/15/1987   Years since quitting: 33.6  Smokeless Tobacco Never    OBJECTIVE:  Vitals:   10/28/20 1351  BP: 111/64  Pulse: 89  Resp: 20  Temp: 98.2 F (36.8 C)  SpO2: 100%     General appearance: alert; no distress HEENT: nasal congestion; clear runny nose; throat irritation secondary to post-nasal drainage; bilateral maxillary tenderness to palpation; turbinates boggy Neck: supple without LAD; trachea midline Lungs: unlabored respirations, symmetrical air entry; cough: absent; no respiratory distress Skin: warm and dry Psychological: alert and cooperative; normal mood  and affect  Allergies  Allergen Reactions   Baycol [Cerivastatin Sodium] Itching   Crestor [Rosuvastatin Calcium] Itching   Lipitor [Atorvastatin Calcium] Itching   Morphine And Related Other (See Comments)    Hallucinations , skin crawling, "feels like elephants are crawling"   Pravachol Other (See Comments)    Pt doesn't recall reaction   Propylene Glycol Itching    Severe itching   Balsam [place]-Castor [Balsam [place]-Castor Oil] Itching and Rash   Ciprofloxacin Hives and Rash   Diphenhydramine Hcl Rash   Methyldibromoglutaronitrile Rash    Per patch testing   Other Itching and Rash    Botanicals , Fragrance , methyldibromo glutaronitrile, detergents can only use "All free and clear"    Past Medical History:  Diagnosis Date   Fibrocystic breast disease    GERD (gastroesophageal reflux disease)    History of shingles    Humerus fracture    Right   Hypertension    Mixed hyperlipidemia    Renal artery stenosis ([place]) 1984   Status post angioplasty bilaterally   Family History  Problem Relation Age of Onset   Multiple myeloma Father    Breast cancer Mother    Social History   Socioeconomic History   Marital status: Married    Spouse name: Not on file   Number of children: Not on file   Years of education: Not on file   Highest education level: Not on file  Occupational History   Not on file  Tobacco Use   Smoking status: Former    Types:  Cigarettes    Quit date: 03/15/1987    Years since quitting: 33.6   Smokeless tobacco: Never  Vaping Use   Vaping Use: Never used  Substance and Sexual Activity   Alcohol use: Not Currently    Alcohol/week: 0.0 standard drinks    Comment: occasional   Drug use: No   Sexual activity: Not on file  Other Topics Concern   Not on file  Social History Narrative   Not on file   Social Determinants of Health   Financial Resource Strain: Not on file  Food Insecurity: Not on file  Transportation Needs: Not on file  Physical  Activity: Not on file  Stress: Not on file  Social Connections: Not on file  Intimate Partner Violence: Not on file             Vanessa Kick, MD 10/28/20 1417

## 2020-11-09 DIAGNOSIS — I10 Essential (primary) hypertension: Secondary | ICD-10-CM | POA: Diagnosis not present

## 2020-11-09 DIAGNOSIS — R7303 Prediabetes: Secondary | ICD-10-CM | POA: Diagnosis not present

## 2020-11-09 DIAGNOSIS — E785 Hyperlipidemia, unspecified: Secondary | ICD-10-CM | POA: Diagnosis not present

## 2020-11-13 DIAGNOSIS — H209 Unspecified iridocyclitis: Secondary | ICD-10-CM | POA: Diagnosis not present

## 2020-11-13 DIAGNOSIS — E782 Mixed hyperlipidemia: Secondary | ICD-10-CM | POA: Diagnosis not present

## 2020-11-13 DIAGNOSIS — K219 Gastro-esophageal reflux disease without esophagitis: Secondary | ICD-10-CM | POA: Diagnosis not present

## 2020-11-13 DIAGNOSIS — M810 Age-related osteoporosis without current pathological fracture: Secondary | ICD-10-CM | POA: Diagnosis not present

## 2020-11-13 DIAGNOSIS — R7303 Prediabetes: Secondary | ICD-10-CM | POA: Diagnosis not present

## 2020-11-13 DIAGNOSIS — L659 Nonscarring hair loss, unspecified: Secondary | ICD-10-CM | POA: Diagnosis not present

## 2020-11-13 DIAGNOSIS — I10 Essential (primary) hypertension: Secondary | ICD-10-CM | POA: Diagnosis not present

## 2020-11-13 DIAGNOSIS — Z0001 Encounter for general adult medical examination with abnormal findings: Secondary | ICD-10-CM | POA: Diagnosis not present

## 2020-11-13 DIAGNOSIS — L309 Dermatitis, unspecified: Secondary | ICD-10-CM | POA: Diagnosis not present

## 2020-11-13 DIAGNOSIS — Z96643 Presence of artificial hip joint, bilateral: Secondary | ICD-10-CM | POA: Diagnosis not present

## 2020-11-13 DIAGNOSIS — B0229 Other postherpetic nervous system involvement: Secondary | ICD-10-CM | POA: Diagnosis not present

## 2020-11-13 DIAGNOSIS — I701 Atherosclerosis of renal artery: Secondary | ICD-10-CM | POA: Diagnosis not present

## 2020-11-30 ENCOUNTER — Ambulatory Visit (HOSPITAL_COMMUNITY): Payer: Medicare Other

## 2020-12-02 ENCOUNTER — Other Ambulatory Visit: Payer: Self-pay | Admitting: Student

## 2020-12-02 ENCOUNTER — Encounter (HOSPITAL_COMMUNITY)
Admission: RE | Admit: 2020-12-02 | Discharge: 2020-12-02 | Disposition: A | Discharge status: Home / Self Care | Payer: Medicare Other | Source: Ambulatory Visit | Attending: Internal Medicine | Admitting: Internal Medicine

## 2020-12-02 ENCOUNTER — Other Ambulatory Visit: Payer: Self-pay

## 2020-12-02 ENCOUNTER — Encounter: Payer: Self-pay | Admitting: *Deleted

## 2020-12-02 ENCOUNTER — Ambulatory Visit
Admission: EM | Admit: 2020-12-02 | Discharge: 2020-12-02 | Disposition: A | Discharge status: Home / Self Care | Payer: Medicare Other | Attending: Physician Assistant | Admitting: Physician Assistant

## 2020-12-02 DIAGNOSIS — M810 Age-related osteoporosis without current pathological fracture: Secondary | ICD-10-CM | POA: Insufficient documentation

## 2020-12-02 DIAGNOSIS — J0120 Acute ethmoidal sinusitis, unspecified: Secondary | ICD-10-CM

## 2020-12-02 MED ORDER — DENOSUMAB 60 MG/ML ~~LOC~~ SOSY
60.0000 mg | PREFILLED_SYRINGE | Freq: Once | SUBCUTANEOUS | Status: AC
  Administered 2020-12-02: 60 mg via SUBCUTANEOUS

## 2020-12-02 MED ORDER — DENOSUMAB 60 MG/ML ~~LOC~~ SOSY
PREFILLED_SYRINGE | SUBCUTANEOUS | Status: AC
  Filled 2020-12-02: qty 1

## 2020-12-02 MED ORDER — AMOXICILLIN-POT CLAVULANATE 875-125 MG PO TABS: 1.0000 | ORAL_TABLET | Freq: Two times a day (BID) | ORAL | 0 refills | Status: AC

## 2020-12-02 NOTE — ED Triage Notes (Signed)
Pt reports continued Rt sided facial congestion after completing meds  given in August.

## 2020-12-02 NOTE — Telephone Encounter (Signed)
This is a [place] pt.  °

## 2020-12-02 NOTE — Discharge Instructions (Addendum)
See your Physician for recheck if symptoms persist  

## 2020-12-08 DIAGNOSIS — R059 Cough, unspecified: Secondary | ICD-10-CM | POA: Diagnosis not present

## 2020-12-08 DIAGNOSIS — J0100 Acute maxillary sinusitis, unspecified: Secondary | ICD-10-CM | POA: Diagnosis not present

## 2020-12-08 NOTE — ED Provider Notes (Signed)
[facility]    CSN: [id number] Arrival date & time: 12/02/20  4818      History   Chief Complaint Chief Complaint  Patient presents with   Facial Pain    HPI [name] is a 87 y.o. female.   The history is provided by the patient. No language interpreter was used.  Cough Cough characteristics:  Non-productive Sputum characteristics:  Nondescript Severity:  Moderate Onset quality:  Gradual Timing:  Constant Progression:  Worsening Chronicity:  New Relieved by:  Nothing Worsened by:  Nothing Ineffective treatments:  None tried Associated symptoms: no wheezing    Past Medical History:  Diagnosis Date   Fibrocystic breast disease    GERD (gastroesophageal reflux disease)    History of shingles    Humerus fracture    Right   Hypertension    Mixed hyperlipidemia    Renal artery stenosis ([place]) 1984   Status post angioplasty bilaterally    Patient Active Problem List   Diagnosis Date Noted   Osteoarthritis of knee 04/16/2017   Contact dermatitis 10/26/2016   S/P shoulder replacement, right 09/23/2016   History of renal artery stenosis 08/26/2015   History of multiple allergies 10/25/2013   Benign hypertensive heart disease without heart failure 08/06/2010   Hypercholesterolemia 08/06/2010    Past Surgical History:  Procedure Laterality Date   ABDOMINAL HYSTERECTOMY     BELPHAROPTOSIS REPAIR     breast biopsies     for benign lumps   BREAST LUMPECTOMY  1985   CATARACT EXTRACTION W/PHACO Left 08/22/2016   Procedure: CATARACT EXTRACTION PHACO AND INTRAOCULAR LENS PLACEMENT ([place]);  Surgeon: Tonny Branch, MD;  Location: [facility];  Service: Ophthalmology;  Laterality: Left;  CDE: 8.63   CATARACT EXTRACTION W/PHACO Right 09/19/2016   Procedure: CATARACT EXTRACTION PHACO AND INTRAOCULAR LENS PLACEMENT ([facility]);  Surgeon: Tonny Branch, MD;  Location: [facility];  Service: Ophthalmology;  Laterality: Right;  CDE: 9.96   COLONOSCOPY     RENAL ARTERY ANGIOPLASTY  1987    right Dr. Glennon Mac   REVERSE SHOULDER ARTHROPLASTY Right 09/23/2016   Procedure: REVERSE SHOULDER ARTHROPLASTY;  Surgeon: Netta Cedars, MD;  Location: [facility];  Service: Orthopedics;  Laterality: Right;  2 hrs   TONSILLECTOMY     TONSILLECTOMY     TOTAL HIP ARTHROPLASTY     right   VEIN LIGATION  1974   both legs   WRIST FRACTURE SURGERY Right 2002   WRIST FRACTURE SURGERY Left 2017    OB History   No obstetric history on file.      Home Medications    Prior to Admission medications   Medication Sig Start Date End Date Taking? Authorizing Provider  amoxicillin-clavulanate (AUGMENTIN) 875-125 MG tablet Take 1 tablet by mouth 2 (two) times daily for 10 days. 12/02/20 12/12/20 Yes Fransico Meadow, PA-C  Ascorbic Acid (VITAMIN C) 100 MG tablet Take by mouth.    [provider]  Biotin 5000 MCG CAPS Take 5,000 mcg by mouth daily.    [provider]  calcium citrate-vitamin D (CITRACAL+D) 315-200 MG-UNIT tablet Take 1 tablet by mouth daily.    [provider]  carvedilol (COREG) 12.5 MG tablet TAKE ONE TABLET (12.$RemoveBefore'5MG'lbKdvxilYJXRy$  TOTAL) BY MOUTH TWO TIMES DAILY 07/30/20   Satira Sark, MD  clobetasol ointment (TEMOVATE) 0.05 % Apply topically. 05/08/17   [provider]  denosumab (PROLIA) 60 MG/ML SOSY injection Prolia 60 mg/mL subcutaneous syringe    [provider]  Emollient (CERAVE) LOTN  Apply 1 application topically 3 (three) times daily as needed (itching).    [provider]  ezetimibe (ZETIA) 10 MG tablet TAKE ONE (1) TABLET BY MOUTH EVERY DAY 01/30/20   Ahmed Prima, [place] M, PA-C  fluocinolone (SYNALAR) 0.025 % ointment prn as needed 07/28/10   [provider]  gabapentin (NEURONTIN) 300 MG capsule at bedtime.  12/03/18   [provider]  INVELTYS 1 % SUSP  09/26/18   [provider]  losartan (COZAAR) 100 MG tablet TAKE ONE (1) TABLET BY MOUTH EVERY DAY 12/02/20   Strader, Fransisco Hertz, PA-C  NON FORMULARY  [facility]: Terbinafine 3%,Fluconazole 2%, Tea Tree Oil 5%, Urea 10%,Ibuprofen 2% in DMSO    [provider]  omeprazole (PRILOSEC OTC) 20 MG tablet Take 20 mg by mouth daily.    [provider]  prednisoLONE acetate (PRED FORTE) 1 % ophthalmic suspension  08/01/18   [provider]  valACYclovir (VALTREX) 1000 MG tablet  07/19/18   [provider]    Family History Family History  Problem Relation Age of Onset   Multiple myeloma Father    Breast cancer Mother     Social History Social History   Tobacco Use   Smoking status: Former    Types: Cigarettes    Quit date: 03/15/1987    Years since quitting: 33.7   Smokeless tobacco: Never  Vaping Use   Vaping Use: Never used  Substance Use Topics   Alcohol use: Not Currently    Alcohol/week: 0.0 standard drinks    Comment: occasional   Drug use: No     Allergies   Baycol [cerivastatin sodium], Crestor [rosuvastatin calcium], Lipitor [atorvastatin calcium], Morphine and related, Pravachol, Propylene glycol, Balsam peru-castor [balsam peru-castor oil], Ciprofloxacin, Diphenhydramine hcl, Methyldibromoglutaronitrile, and Other   Review of Systems Review of Systems  Respiratory:  Positive for cough. Negative for wheezing.   All other systems reviewed and are negative.   Physical Exam Triage Vital Signs ED Triage Vitals [12/02/20 0949]  Enc Vitals Group     BP 120/72     Pulse Rate 70     Resp 18     Temp 98.6 F (37 C)     Temp src      SpO2 93 %     Weight      Height      Head Circumference      Peak Flow      Pain Score 0     Pain Loc      Pain Edu?      Excl. in [place]?    No data found.  Updated Vital Signs BP 120/72   Pulse 70   Temp 98.6 F (37 C)   Resp 18   SpO2 93%   Visual Acuity Right Eye Distance:   Left Eye Distance:   Bilateral Distance:    Right Eye Near:   Left Eye Near:    Bilateral Near:     Physical Exam Vitals and nursing note reviewed.   Constitutional:      General: She is not in acute distress.    Appearance: She is well-developed.  HENT:     Head: Normocephalic and atraumatic.  Eyes:     Conjunctiva/sclera: Conjunctivae normal.  Cardiovascular:     Rate and Rhythm: Normal rate and regular rhythm.     Heart sounds: No murmur heard. Pulmonary:     Effort: Pulmonary effort is normal. No respiratory distress.     Breath  sounds: Normal breath sounds.  Abdominal:     Palpations: Abdomen is soft.     Tenderness: There is no abdominal tenderness.  Musculoskeletal:        General: Normal range of motion.     Cervical back: Neck supple.  Skin:    General: Skin is warm and dry.  Neurological:     Mental Status: She is alert.     UC Treatments / Results  Labs (all labs ordered are listed, but only abnormal results are displayed) Labs Reviewed - No data to display  EKG   Radiology No results found.  Procedures Procedures (including critical care time)  Medications Ordered in UC Medications - No data to display  Initial Impression / Assessment and Plan / UC Course  I have reviewed the triage vital signs and the nursing notes.  Pertinent labs & imaging results that were available during my care of the patient were reviewed by me and considered in my medical decision making (see chart for details).     MDM: Pt given rx for augmentin Final Clinical Impressions(s) / UC Diagnoses   Final diagnoses:  Acute ethmoidal sinusitis, recurrence not specified     Discharge Instructions      See your Physician for recheck if symptoms persist    ED Prescriptions     Medication Sig Dispense Auth. Provider   amoxicillin-clavulanate (AUGMENTIN) 875-125 MG tablet Take 1 tablet by mouth 2 (two) times daily for 10 days. 20 tablet Fransico Meadow, [place]      PDMP not reviewed this encounter. An After Visit Summary was printed and given to the patient.    Fransico Meadow, [place] 12/08/20 2005

## 2020-12-21 DIAGNOSIS — J343 Hypertrophy of nasal turbinates: Secondary | ICD-10-CM | POA: Diagnosis not present

## 2020-12-21 DIAGNOSIS — J342 Deviated nasal septum: Secondary | ICD-10-CM | POA: Diagnosis not present

## 2020-12-21 DIAGNOSIS — J310 Chronic rhinitis: Secondary | ICD-10-CM | POA: Diagnosis not present

## 2021-01-07 DIAGNOSIS — Z23 Encounter for immunization: Secondary | ICD-10-CM | POA: Diagnosis not present

## 2021-01-15 DIAGNOSIS — B0223 Postherpetic polyneuropathy: Secondary | ICD-10-CM | POA: Diagnosis not present

## 2021-01-18 ENCOUNTER — Encounter: Payer: Self-pay | Admitting: Neurology

## 2021-01-19 DIAGNOSIS — B0239 Other herpes zoster eye disease: Secondary | ICD-10-CM | POA: Diagnosis not present

## 2021-01-19 DIAGNOSIS — H18513 Endothelial corneal dystrophy, bilateral: Secondary | ICD-10-CM | POA: Diagnosis not present

## 2021-01-19 DIAGNOSIS — H16223 Keratoconjunctivitis sicca, not specified as Sjogren's, bilateral: Secondary | ICD-10-CM | POA: Diagnosis not present

## 2021-01-25 ENCOUNTER — Ambulatory Visit: Payer: Medicare Other | Admitting: Podiatry

## 2021-02-01 DIAGNOSIS — B351 Tinea unguium: Secondary | ICD-10-CM | POA: Diagnosis not present

## 2021-02-01 DIAGNOSIS — M79674 Pain in right toe(s): Secondary | ICD-10-CM | POA: Diagnosis not present

## 2021-02-01 DIAGNOSIS — M79675 Pain in left toe(s): Secondary | ICD-10-CM | POA: Diagnosis not present

## 2021-02-01 DIAGNOSIS — L600 Ingrowing nail: Secondary | ICD-10-CM | POA: Diagnosis not present

## 2021-02-12 DIAGNOSIS — J342 Deviated nasal septum: Secondary | ICD-10-CM | POA: Diagnosis not present

## 2021-02-12 DIAGNOSIS — H6123 Impacted cerumen, bilateral: Secondary | ICD-10-CM | POA: Diagnosis not present

## 2021-02-12 DIAGNOSIS — J343 Hypertrophy of nasal turbinates: Secondary | ICD-10-CM | POA: Diagnosis not present

## 2021-02-12 DIAGNOSIS — J338 Other polyp of sinus: Secondary | ICD-10-CM | POA: Diagnosis not present

## 2021-02-12 DIAGNOSIS — J310 Chronic rhinitis: Secondary | ICD-10-CM | POA: Diagnosis not present

## 2021-02-16 ENCOUNTER — Other Ambulatory Visit (HOSPITAL_COMMUNITY): Payer: Self-pay | Admitting: Otolaryngology

## 2021-02-16 ENCOUNTER — Other Ambulatory Visit: Payer: Self-pay | Admitting: Otolaryngology

## 2021-02-16 DIAGNOSIS — J320 Chronic maxillary sinusitis: Secondary | ICD-10-CM

## 2021-02-22 ENCOUNTER — Other Ambulatory Visit: Payer: Self-pay | Admitting: Student

## 2021-02-22 NOTE — Telephone Encounter (Signed)
This is a [place] pt.  °

## 2021-02-24 ENCOUNTER — Ambulatory Visit (HOSPITAL_COMMUNITY)
Admission: RE | Admit: 2021-02-24 | Discharge: 2021-02-24 | Disposition: A | Discharge status: Home / Self Care | Payer: Medicare Other | Source: Ambulatory Visit | Attending: Otolaryngology | Admitting: Otolaryngology

## 2021-02-24 ENCOUNTER — Other Ambulatory Visit: Payer: Self-pay

## 2021-02-24 DIAGNOSIS — J320 Chronic maxillary sinusitis: Secondary | ICD-10-CM

## 2021-02-24 DIAGNOSIS — J330 Polyp of nasal cavity: Secondary | ICD-10-CM | POA: Diagnosis not present

## 2021-02-24 DIAGNOSIS — J342 Deviated nasal septum: Secondary | ICD-10-CM | POA: Diagnosis not present

## 2021-03-09 DIAGNOSIS — J322 Chronic ethmoidal sinusitis: Secondary | ICD-10-CM | POA: Diagnosis not present

## 2021-03-09 DIAGNOSIS — J338 Other polyp of sinus: Secondary | ICD-10-CM | POA: Diagnosis not present

## 2021-03-09 DIAGNOSIS — J320 Chronic maxillary sinusitis: Secondary | ICD-10-CM | POA: Diagnosis not present

## 2021-03-09 DIAGNOSIS — J342 Deviated nasal septum: Secondary | ICD-10-CM | POA: Diagnosis not present

## 2021-03-23 DIAGNOSIS — J322 Chronic ethmoidal sinusitis: Secondary | ICD-10-CM | POA: Diagnosis not present

## 2021-03-23 DIAGNOSIS — J342 Deviated nasal septum: Secondary | ICD-10-CM | POA: Diagnosis not present

## 2021-03-23 DIAGNOSIS — B479 Mycetoma, unspecified: Secondary | ICD-10-CM | POA: Diagnosis not present

## 2021-03-23 DIAGNOSIS — J320 Chronic maxillary sinusitis: Secondary | ICD-10-CM | POA: Diagnosis not present

## 2021-03-23 DIAGNOSIS — J343 Hypertrophy of nasal turbinates: Secondary | ICD-10-CM | POA: Diagnosis not present

## 2021-03-23 DIAGNOSIS — J338 Other polyp of sinus: Secondary | ICD-10-CM | POA: Diagnosis not present

## 2021-03-26 DIAGNOSIS — J320 Chronic maxillary sinusitis: Secondary | ICD-10-CM | POA: Diagnosis not present

## 2021-03-26 DIAGNOSIS — J322 Chronic ethmoidal sinusitis: Secondary | ICD-10-CM | POA: Diagnosis not present

## 2021-03-26 DIAGNOSIS — J338 Other polyp of sinus: Secondary | ICD-10-CM | POA: Diagnosis not present

## 2021-03-30 ENCOUNTER — Ambulatory Visit: Payer: Medicare Other | Admitting: Neurology

## 2021-04-06 NOTE — Progress Notes (Addendum)
NEUROLOGY CONSULTATION NOTE  [name] MRN: [id number] DOB: 12/04/1933  Referring provider: Eloy End, FNP Primary care provider: Celene Squibb, MD  Reason for consult:  postherpetic neuralgia  Assessment/Plan:   Postherpetic neuralgia  As she has flare up of pain every night around 8 PM, recommended moving 600mg  dose up to 7 PM and not wait until pain starts.  She may still take 100mg  during the day if needed. Follow up as needed.   Subjective:  [name] is an 88 year old female with HTN, HLD,renal artery stenosis s/p angioplasty who presents for postherpetic neuralgia.  History supplemented by referring provider's note.  Patient developed herpes zoster ophthalmicus in April 2020, with rash involving the upper lid of her left eye and radiating to back of the head.  Since then, she continues to have pain.  Overall it is fairly well-controlled.  It flares up every night at around 8 PM, at which point, she takes 600mg  of gabapentin and falls asleep within an hour (she goes to bed early).  During the day, there is sometimes a slight discomfort but not painful.  Once in awhile she may take 100mg  of gabapentin during the day if the pain is a little worse, but that is very seldom.  She takes valacyclovir 1g daily for iritis.       PAST MEDICAL HISTORY: Past Medical History:  Diagnosis Date   Fibrocystic breast disease    GERD (gastroesophageal reflux disease)    History of shingles    Humerus fracture    Right   Hypertension    Mixed hyperlipidemia    Renal artery stenosis ([place]) 1984   Status post angioplasty bilaterally    PAST SURGICAL HISTORY: Past Surgical History:  Procedure Laterality Date   ABDOMINAL HYSTERECTOMY     BELPHAROPTOSIS REPAIR     breast biopsies     for benign lumps   BREAST LUMPECTOMY  1985   CATARACT EXTRACTION W/PHACO Left 08/22/2016   Procedure: CATARACT EXTRACTION PHACO AND INTRAOCULAR LENS PLACEMENT ([place]);  Surgeon: Tonny Branch, MD;  Location:  [facility];  Service: Ophthalmology;  Laterality: Left;  CDE: 8.63   CATARACT EXTRACTION W/PHACO Right 09/19/2016   Procedure: CATARACT EXTRACTION PHACO AND INTRAOCULAR LENS PLACEMENT ([facility]);  Surgeon: Tonny Branch, MD;  Location: [facility];  Service: Ophthalmology;  Laterality: Right;  CDE: 9.96   COLONOSCOPY     RENAL ARTERY ANGIOPLASTY  1987   right Dr. Glennon Mac   REVERSE SHOULDER ARTHROPLASTY Right 09/23/2016   Procedure: REVERSE SHOULDER ARTHROPLASTY;  Surgeon: Netta Cedars, MD;  Location: [facility];  Service: Orthopedics;  Laterality: Right;  2 hrs   TONSILLECTOMY     TONSILLECTOMY     TOTAL HIP ARTHROPLASTY     right   VEIN LIGATION  1974   both legs   WRIST FRACTURE SURGERY Right 2002   WRIST FRACTURE SURGERY Left 2017    MEDICATIONS: Current Outpatient Medications on File Prior to Visit  Medication Sig Dispense Refill   Ascorbic Acid (VITAMIN C) 100 MG tablet Take by mouth.     Biotin 5000 MCG CAPS Take 5,000 mcg by mouth daily.     calcium citrate-vitamin D (CITRACAL+D) 315-200 MG-UNIT tablet Take 1 tablet by mouth daily.     carvedilol (COREG) 12.5 MG tablet TAKE ONE TABLET (12.5MG  TOTAL) BY MOUTH TWO TIMES DAILY 180 tablet 3   clobetasol ointment (TEMOVATE) 0.05 % Apply topically.     denosumab (PROLIA) 60 MG/ML SOSY injection Prolia  60 mg/mL subcutaneous syringe     Emollient (CERAVE) LOTN Apply 1 application topically 3 (three) times daily as needed (itching).     ezetimibe (ZETIA) 10 MG tablet TAKE ONE (1) TABLET BY MOUTH EVERY DAY 90 tablet 3   fluocinolone (SYNALAR) 0.025 % ointment prn as needed     gabapentin (NEURONTIN) 300 MG capsule at bedtime.      INVELTYS 1 % SUSP      losartan (COZAAR) 100 MG tablet TAKE ONE (1) TABLET BY MOUTH EVERY DAY 90 tablet 2   NON FORMULARY  [facility]: Terbinafine 3%,Fluconazole 2%, Tea Tree Oil 5%, Urea 10%,Ibuprofen 2% in DMSO     omeprazole (PRILOSEC OTC) 20 MG tablet Take 20 mg by mouth daily.     prednisoLONE acetate (PRED FORTE) 1 %  ophthalmic suspension      valACYclovir (VALTREX) 1000 MG tablet      No current facility-administered medications on file prior to visit.    ALLERGIES: Allergies  Allergen Reactions   Baycol [Cerivastatin Sodium] Itching   Crestor [Rosuvastatin Calcium] Itching   Lipitor [Atorvastatin Calcium] Itching   Morphine And Related Other (See Comments)    Hallucinations , skin crawling, "feels like elephants are crawling"   Pravachol Other (See Comments)    Pt doesn't recall reaction   Propylene Glycol Itching    Severe itching   Balsam [place]-Castor [Balsam [place]-Castor Oil] Itching and Rash   Ciprofloxacin Hives and Rash   Diphenhydramine Hcl Rash   Methyldibromoglutaronitrile Rash    Per patch testing   Other Itching and Rash    Botanicals , Fragrance , methyldibromo glutaronitrile, detergents can only use "All free and clear"    FAMILY HISTORY: Family History  Problem Relation Age of Onset   Multiple myeloma Father    Breast cancer Mother     Objective:  Blood pressure 124/78, pulse 68, height $RemoveBe'5\' 2"'WWhDVjeeV$  (1.575 m), weight 143 lb 3.2 oz (65 kg), SpO2 97 %. General: No acute distress.  Patient appears well-groomed.   Head:  Normocephalic/atraumatic Eyes:  fundi examined but not visualized Neck: supple, no paraspinal tenderness, full range of motion Back: No paraspinal tenderness Heart: regular rate and rhythm Lungs: Clear to auscultation bilaterally. Vascular: No carotid bruits. Neurological Exam: Mental status: alert and oriented to person, place, and time, recent and remote memory intact, fund of knowledge intact, attention and concentration intact, speech fluent and not dysarthric, language intact. Cranial nerves: CN I: not tested CN II: pupils equal, round and reactive to light, visual fields intact CN III, IV, VI:  full range of motion, no nystagmus, no ptosis CN V: reduced left V1 CN VII: upper and lower face symmetric CN VIII: hearing intact CN IX, X: gag intact, uvula  midline CN XI: sternocleidomastoid and trapezius muscles intact CN XII: tongue midline Bulk & Tone: normal, no fasciculations. Motor:  muscle strength 5/5 throughout Sensation:  Pinprick, temperature and vibratory sensation intact. Deep Tendon Reflexes:  2+ throughout,  toes downgoing.   Finger to nose testing:  Without dysmetria.   Heel to shin:  Without dysmetria.   Gait:  Steady.  Romberg negative.    Thank you for allowing me to take part in the care of this patient.  Metta Clines, DO  CC:  Ignatius Specking, FNP  Allyn Kenner, MD

## 2021-04-07 ENCOUNTER — Other Ambulatory Visit: Payer: Self-pay

## 2021-04-07 ENCOUNTER — Encounter: Payer: Self-pay | Admitting: Neurology

## 2021-04-07 ENCOUNTER — Ambulatory Visit (INDEPENDENT_AMBULATORY_CARE_PROVIDER_SITE_OTHER): Payer: Medicare Other | Admitting: Neurology

## 2021-04-07 VITALS — BP 124/78 | HR 68 | Ht 62.0 in | Wt 143.2 lb

## 2021-04-07 DIAGNOSIS — B0229 Other postherpetic nervous system involvement: Secondary | ICD-10-CM

## 2021-04-07 NOTE — Patient Instructions (Signed)
Take gabapentin 600mg  at 7 PM every night.  May still take 100mg  during the day as needed

## 2021-04-16 ENCOUNTER — Ambulatory Visit: Payer: Medicare Other | Admitting: Neurology

## 2021-04-16 DIAGNOSIS — J322 Chronic ethmoidal sinusitis: Secondary | ICD-10-CM | POA: Diagnosis not present

## 2021-04-16 DIAGNOSIS — J338 Other polyp of sinus: Secondary | ICD-10-CM | POA: Diagnosis not present

## 2021-04-16 DIAGNOSIS — J320 Chronic maxillary sinusitis: Secondary | ICD-10-CM | POA: Diagnosis not present

## 2021-05-03 DIAGNOSIS — E785 Hyperlipidemia, unspecified: Secondary | ICD-10-CM | POA: Diagnosis not present

## 2021-05-03 DIAGNOSIS — M810 Age-related osteoporosis without current pathological fracture: Secondary | ICD-10-CM | POA: Diagnosis not present

## 2021-05-03 DIAGNOSIS — I10 Essential (primary) hypertension: Secondary | ICD-10-CM | POA: Diagnosis not present

## 2021-05-03 DIAGNOSIS — B0229 Other postherpetic nervous system involvement: Secondary | ICD-10-CM | POA: Diagnosis not present

## 2021-05-04 DIAGNOSIS — J338 Other polyp of sinus: Secondary | ICD-10-CM | POA: Diagnosis not present

## 2021-05-04 DIAGNOSIS — J320 Chronic maxillary sinusitis: Secondary | ICD-10-CM | POA: Diagnosis not present

## 2021-05-04 DIAGNOSIS — J322 Chronic ethmoidal sinusitis: Secondary | ICD-10-CM | POA: Diagnosis not present

## 2021-05-07 ENCOUNTER — Other Ambulatory Visit (HOSPITAL_COMMUNITY): Payer: Self-pay | Admitting: Family Medicine

## 2021-05-07 ENCOUNTER — Other Ambulatory Visit (HOSPITAL_COMMUNITY): Payer: Self-pay | Admitting: Internal Medicine

## 2021-05-07 DIAGNOSIS — Z1231 Encounter for screening mammogram for malignant neoplasm of breast: Secondary | ICD-10-CM

## 2021-05-19 ENCOUNTER — Other Ambulatory Visit: Payer: Self-pay

## 2021-05-19 ENCOUNTER — Ambulatory Visit (HOSPITAL_COMMUNITY)
Admission: RE | Admit: 2021-05-19 | Discharge: 2021-05-19 | Disposition: A | Discharge status: Home / Self Care | Payer: Medicare Other | Source: Ambulatory Visit | Attending: Family Medicine | Admitting: Family Medicine

## 2021-05-19 DIAGNOSIS — Z1231 Encounter for screening mammogram for malignant neoplasm of breast: Secondary | ICD-10-CM | POA: Insufficient documentation

## 2021-05-24 DIAGNOSIS — B351 Tinea unguium: Secondary | ICD-10-CM | POA: Diagnosis not present

## 2021-05-24 DIAGNOSIS — M79674 Pain in right toe(s): Secondary | ICD-10-CM | POA: Diagnosis not present

## 2021-05-24 DIAGNOSIS — M79675 Pain in left toe(s): Secondary | ICD-10-CM | POA: Diagnosis not present

## 2021-05-24 DIAGNOSIS — L600 Ingrowing nail: Secondary | ICD-10-CM | POA: Diagnosis not present

## 2021-06-01 ENCOUNTER — Encounter (HOSPITAL_COMMUNITY)
Admission: RE | Admit: 2021-06-01 | Discharge: 2021-06-01 | Disposition: A | Discharge status: Home / Self Care | Payer: Medicare Other | Source: Ambulatory Visit | Attending: Internal Medicine | Admitting: Internal Medicine

## 2021-06-01 DIAGNOSIS — M810 Age-related osteoporosis without current pathological fracture: Secondary | ICD-10-CM | POA: Insufficient documentation

## 2021-06-01 MED ORDER — DENOSUMAB 60 MG/ML ~~LOC~~ SOSY
60.0000 mg | PREFILLED_SYRINGE | Freq: Once | SUBCUTANEOUS | Status: AC
  Administered 2021-06-01: 60 mg via SUBCUTANEOUS

## 2021-07-20 DIAGNOSIS — R7303 Prediabetes: Secondary | ICD-10-CM | POA: Diagnosis not present

## 2021-07-20 DIAGNOSIS — E785 Hyperlipidemia, unspecified: Secondary | ICD-10-CM | POA: Diagnosis not present

## 2021-07-20 DIAGNOSIS — I10 Essential (primary) hypertension: Secondary | ICD-10-CM | POA: Diagnosis not present

## 2021-07-27 DIAGNOSIS — L309 Dermatitis, unspecified: Secondary | ICD-10-CM | POA: Diagnosis not present

## 2021-07-27 DIAGNOSIS — H209 Unspecified iridocyclitis: Secondary | ICD-10-CM | POA: Diagnosis not present

## 2021-07-27 DIAGNOSIS — I701 Atherosclerosis of renal artery: Secondary | ICD-10-CM | POA: Diagnosis not present

## 2021-07-27 DIAGNOSIS — B0229 Other postherpetic nervous system involvement: Secondary | ICD-10-CM | POA: Diagnosis not present

## 2021-07-27 DIAGNOSIS — R7303 Prediabetes: Secondary | ICD-10-CM | POA: Diagnosis not present

## 2021-07-27 DIAGNOSIS — I10 Essential (primary) hypertension: Secondary | ICD-10-CM | POA: Diagnosis not present

## 2021-07-27 DIAGNOSIS — E782 Mixed hyperlipidemia: Secondary | ICD-10-CM | POA: Diagnosis not present

## 2021-07-27 DIAGNOSIS — K219 Gastro-esophageal reflux disease without esophagitis: Secondary | ICD-10-CM | POA: Diagnosis not present

## 2021-07-27 DIAGNOSIS — L659 Nonscarring hair loss, unspecified: Secondary | ICD-10-CM | POA: Diagnosis not present

## 2021-07-27 DIAGNOSIS — Z96643 Presence of artificial hip joint, bilateral: Secondary | ICD-10-CM | POA: Diagnosis not present

## 2021-07-27 DIAGNOSIS — B351 Tinea unguium: Secondary | ICD-10-CM | POA: Diagnosis not present

## 2021-07-27 DIAGNOSIS — M810 Age-related osteoporosis without current pathological fracture: Secondary | ICD-10-CM | POA: Diagnosis not present

## 2021-08-02 ENCOUNTER — Other Ambulatory Visit: Payer: Self-pay | Admitting: Cardiology

## 2021-08-02 DIAGNOSIS — M79675 Pain in left toe(s): Secondary | ICD-10-CM | POA: Diagnosis not present

## 2021-08-02 DIAGNOSIS — M79674 Pain in right toe(s): Secondary | ICD-10-CM | POA: Diagnosis not present

## 2021-08-02 DIAGNOSIS — L600 Ingrowing nail: Secondary | ICD-10-CM | POA: Diagnosis not present

## 2021-08-02 DIAGNOSIS — B351 Tinea unguium: Secondary | ICD-10-CM | POA: Diagnosis not present

## 2021-08-31 DIAGNOSIS — B0239 Other herpes zoster eye disease: Secondary | ICD-10-CM | POA: Diagnosis not present

## 2021-08-31 DIAGNOSIS — H18513 Endothelial corneal dystrophy, bilateral: Secondary | ICD-10-CM | POA: Diagnosis not present

## 2021-09-03 ENCOUNTER — Other Ambulatory Visit: Payer: Self-pay | Admitting: Cardiology

## 2021-09-23 ENCOUNTER — Other Ambulatory Visit: Payer: Self-pay | Admitting: Cardiology

## 2021-10-01 DIAGNOSIS — L299 Pruritus, unspecified: Secondary | ICD-10-CM | POA: Diagnosis not present

## 2021-10-01 DIAGNOSIS — L2389 Allergic contact dermatitis due to other agents: Secondary | ICD-10-CM | POA: Diagnosis not present

## 2021-10-01 DIAGNOSIS — L2084 Intrinsic (allergic) eczema: Secondary | ICD-10-CM | POA: Diagnosis not present

## 2021-10-01 DIAGNOSIS — Z5181 Encounter for therapeutic drug level monitoring: Secondary | ICD-10-CM | POA: Diagnosis not present

## 2021-10-07 ENCOUNTER — Other Ambulatory Visit: Payer: Self-pay | Admitting: Student

## 2021-10-07 ENCOUNTER — Telehealth: Payer: Self-pay | Admitting: Cardiology

## 2021-10-07 MED ORDER — CARVEDILOL 12.5 MG PO TABS: ORAL_TABLET | ORAL | 1 refills | Status: DC

## 2021-10-07 NOTE — Telephone Encounter (Signed)
F/u apt made,refilled per request

## 2021-10-07 NOTE — Telephone Encounter (Signed)
*  STAT* If patient is at the pharmacy, call can be transferred to refill team.   1. Which medications need to be refilled? (please list name of each medication and dose if known)   carvedilol (COREG) 12.5 MG tablet    2. Which pharmacy/location (including street and city if local pharmacy) is medication to be sent to?  [facility] - [place], [place] - [address]. Do they need a 30 day or 90 day supply?  90 day   Pt has scheduled appt for 01/10/22. Please advise

## 2021-11-02 DIAGNOSIS — J320 Chronic maxillary sinusitis: Secondary | ICD-10-CM | POA: Diagnosis not present

## 2021-11-02 DIAGNOSIS — J322 Chronic ethmoidal sinusitis: Secondary | ICD-10-CM | POA: Diagnosis not present

## 2021-11-02 DIAGNOSIS — J338 Other polyp of sinus: Secondary | ICD-10-CM | POA: Diagnosis not present

## 2021-11-24 DIAGNOSIS — Z23 Encounter for immunization: Secondary | ICD-10-CM | POA: Diagnosis not present

## 2021-11-29 DIAGNOSIS — M79674 Pain in right toe(s): Secondary | ICD-10-CM | POA: Diagnosis not present

## 2021-11-29 DIAGNOSIS — L600 Ingrowing nail: Secondary | ICD-10-CM | POA: Diagnosis not present

## 2021-11-29 DIAGNOSIS — M79675 Pain in left toe(s): Secondary | ICD-10-CM | POA: Diagnosis not present

## 2021-11-29 DIAGNOSIS — B351 Tinea unguium: Secondary | ICD-10-CM | POA: Diagnosis not present

## 2021-12-02 ENCOUNTER — Encounter (HOSPITAL_COMMUNITY)
Admission: RE | Admit: 2021-12-02 | Discharge: 2021-12-02 | Disposition: A | Discharge status: Home / Self Care | Payer: Medicare Other | Source: Ambulatory Visit | Attending: Internal Medicine | Admitting: Internal Medicine

## 2021-12-02 VITALS — BP 170/80 | HR 61 | Temp 97.8°F

## 2021-12-02 DIAGNOSIS — M179 Osteoarthritis of knee, unspecified: Secondary | ICD-10-CM | POA: Insufficient documentation

## 2021-12-02 DIAGNOSIS — M810 Age-related osteoporosis without current pathological fracture: Secondary | ICD-10-CM | POA: Diagnosis present

## 2021-12-02 MED ORDER — DENOSUMAB 60 MG/ML ~~LOC~~ SOSY
60.0000 mg | PREFILLED_SYRINGE | Freq: Once | SUBCUTANEOUS | Status: AC
  Administered 2021-12-02: 60 mg via SUBCUTANEOUS

## 2021-12-02 NOTE — Progress Notes (Signed)
Diagnosis: Osteoporosis  Provider:  Zack Hall MD  Procedure: Injection  Prolia (Denosumab), Dose: 60 mg, Site: subcutaneous, Number of injections: 1  Discharge: Condition: Good, Destination: Home . AVS provided to patient.   Performed by:  Laurann Mcmorris H, RN        

## 2021-12-09 DIAGNOSIS — Z23 Encounter for immunization: Secondary | ICD-10-CM | POA: Diagnosis not present

## 2021-12-10 DIAGNOSIS — L299 Pruritus, unspecified: Secondary | ICD-10-CM | POA: Diagnosis not present

## 2021-12-10 DIAGNOSIS — L2389 Allergic contact dermatitis due to other agents: Secondary | ICD-10-CM | POA: Diagnosis not present

## 2021-12-10 DIAGNOSIS — L282 Other prurigo: Secondary | ICD-10-CM | POA: Diagnosis not present

## 2022-01-04 ENCOUNTER — Other Ambulatory Visit: Payer: Self-pay

## 2022-01-10 ENCOUNTER — Ambulatory Visit: Payer: Medicare Other | Admitting: Medical

## 2022-01-31 DIAGNOSIS — M810 Age-related osteoporosis without current pathological fracture: Secondary | ICD-10-CM | POA: Diagnosis not present

## 2022-01-31 DIAGNOSIS — E782 Mixed hyperlipidemia: Secondary | ICD-10-CM | POA: Diagnosis not present

## 2022-01-31 DIAGNOSIS — R7303 Prediabetes: Secondary | ICD-10-CM | POA: Diagnosis not present

## 2022-02-07 DIAGNOSIS — B0229 Other postherpetic nervous system involvement: Secondary | ICD-10-CM | POA: Diagnosis not present

## 2022-02-07 DIAGNOSIS — I10 Essential (primary) hypertension: Secondary | ICD-10-CM | POA: Diagnosis not present

## 2022-02-07 DIAGNOSIS — L309 Dermatitis, unspecified: Secondary | ICD-10-CM | POA: Diagnosis not present

## 2022-02-07 DIAGNOSIS — B351 Tinea unguium: Secondary | ICD-10-CM | POA: Diagnosis not present

## 2022-02-07 DIAGNOSIS — R7303 Prediabetes: Secondary | ICD-10-CM | POA: Diagnosis not present

## 2022-02-07 DIAGNOSIS — Z96643 Presence of artificial hip joint, bilateral: Secondary | ICD-10-CM | POA: Diagnosis not present

## 2022-02-07 DIAGNOSIS — L659 Nonscarring hair loss, unspecified: Secondary | ICD-10-CM | POA: Diagnosis not present

## 2022-02-07 DIAGNOSIS — E782 Mixed hyperlipidemia: Secondary | ICD-10-CM | POA: Diagnosis not present

## 2022-02-07 DIAGNOSIS — H209 Unspecified iridocyclitis: Secondary | ICD-10-CM | POA: Diagnosis not present

## 2022-02-07 DIAGNOSIS — I701 Atherosclerosis of renal artery: Secondary | ICD-10-CM | POA: Diagnosis not present

## 2022-02-07 DIAGNOSIS — M810 Age-related osteoporosis without current pathological fracture: Secondary | ICD-10-CM | POA: Diagnosis not present

## 2022-02-07 DIAGNOSIS — K219 Gastro-esophageal reflux disease without esophagitis: Secondary | ICD-10-CM | POA: Diagnosis not present

## 2022-02-28 DIAGNOSIS — L600 Ingrowing nail: Secondary | ICD-10-CM | POA: Diagnosis not present

## 2022-02-28 DIAGNOSIS — M79674 Pain in right toe(s): Secondary | ICD-10-CM | POA: Diagnosis not present

## 2022-02-28 DIAGNOSIS — M79675 Pain in left toe(s): Secondary | ICD-10-CM | POA: Diagnosis not present

## 2022-02-28 DIAGNOSIS — B351 Tinea unguium: Secondary | ICD-10-CM | POA: Diagnosis not present

## 2022-03-02 DIAGNOSIS — H16223 Keratoconjunctivitis sicca, not specified as Sjogren's, bilateral: Secondary | ICD-10-CM | POA: Diagnosis not present

## 2022-03-02 DIAGNOSIS — H0102A Squamous blepharitis right eye, upper and lower eyelids: Secondary | ICD-10-CM | POA: Diagnosis not present

## 2022-03-02 DIAGNOSIS — H20022 Recurrent acute iridocyclitis, left eye: Secondary | ICD-10-CM | POA: Diagnosis not present

## 2022-03-02 DIAGNOSIS — H18513 Endothelial corneal dystrophy, bilateral: Secondary | ICD-10-CM | POA: Diagnosis not present

## 2022-03-02 DIAGNOSIS — B0239 Other herpes zoster eye disease: Secondary | ICD-10-CM | POA: Diagnosis not present

## 2022-04-01 ENCOUNTER — Ambulatory Visit: Payer: Medicare Other | Admitting: Medical

## 2022-04-14 ENCOUNTER — Other Ambulatory Visit: Payer: Self-pay

## 2022-04-14 DIAGNOSIS — M810 Age-related osteoporosis without current pathological fracture: Secondary | ICD-10-CM | POA: Insufficient documentation

## 2022-04-18 ENCOUNTER — Telehealth: Payer: Self-pay | Admitting: Pharmacy Technician

## 2022-04-18 NOTE — Telephone Encounter (Signed)
Auth Submission: NO AUTH NEEDED Payer: medicare a/b & [place] Medication & CPT/J Code(s) submitted: Prolia (Denosumab) [id number] Route of submission (phone, fax, portal):  Phone # Fax # Auth type: Buy/Bill Units/visits requested: x2 Reference number: AARP: [id number] Approval from: 04/18/22 to 04/19/23   Medicare will cover 80% and [place] will pick-up remaining 20%.

## 2022-04-20 ENCOUNTER — Other Ambulatory Visit (HOSPITAL_COMMUNITY): Payer: Self-pay | Admitting: Internal Medicine

## 2022-04-20 DIAGNOSIS — Z1231 Encounter for screening mammogram for malignant neoplasm of breast: Secondary | ICD-10-CM

## 2022-05-16 ENCOUNTER — Other Ambulatory Visit: Payer: Self-pay | Admitting: *Deleted

## 2022-05-17 ENCOUNTER — Telehealth: Payer: Self-pay | Admitting: *Deleted

## 2022-05-17 MED ORDER — EZETIMIBE 10 MG PO TABS: 10.0000 mg | ORAL_TABLET | Freq: Every day | ORAL | 1 refills | Status: DC

## 2022-05-17 MED ORDER — CARVEDILOL 12.5 MG PO TABS: ORAL_TABLET | ORAL | 1 refills | Status: DC

## 2022-05-17 NOTE — Telephone Encounter (Signed)
Refill request for zetia 10 mg received [place] 10 mg not listed on medication profile-removed 03/2021 Contacted patient to verify if she has been taking zetia 10 mg daily Confirmed that she has been taking zetia 10 mg daily prescribed by our office Ahmed Prima) Will forward to provider for approval Also scheduled to see Domenic Polite 09/14/2022 '@9'$ :00 am [place] office

## 2022-05-23 ENCOUNTER — Ambulatory Visit (HOSPITAL_COMMUNITY): Payer: Medicare Other

## 2022-05-24 ENCOUNTER — Encounter: Payer: Self-pay | Admitting: *Deleted

## 2022-05-24 MED ORDER — CARVEDILOL 12.5 MG PO TABS: ORAL_TABLET | ORAL | 3 refills | Status: DC

## 2022-05-24 NOTE — Progress Notes (Signed)
Carvedilol 12.5 mg BID refill request sent for 90 day supply 05/17/2022 but patient request 30 day supply-new rx sent today

## 2022-05-30 DIAGNOSIS — B351 Tinea unguium: Secondary | ICD-10-CM | POA: Diagnosis not present

## 2022-05-30 DIAGNOSIS — L600 Ingrowing nail: Secondary | ICD-10-CM | POA: Diagnosis not present

## 2022-05-30 DIAGNOSIS — M810 Age-related osteoporosis without current pathological fracture: Secondary | ICD-10-CM | POA: Diagnosis not present

## 2022-05-30 DIAGNOSIS — M79675 Pain in left toe(s): Secondary | ICD-10-CM | POA: Diagnosis not present

## 2022-05-30 DIAGNOSIS — M79674 Pain in right toe(s): Secondary | ICD-10-CM | POA: Diagnosis not present

## 2022-05-30 DIAGNOSIS — R7303 Prediabetes: Secondary | ICD-10-CM | POA: Diagnosis not present

## 2022-05-30 DIAGNOSIS — E782 Mixed hyperlipidemia: Secondary | ICD-10-CM | POA: Diagnosis not present

## 2022-06-02 ENCOUNTER — Encounter (HOSPITAL_COMMUNITY)
Admission: RE | Admit: 2022-06-02 | Discharge: 2022-06-02 | Disposition: A | Discharge status: Home / Self Care | Payer: Medicare Other | Source: Ambulatory Visit | Attending: Internal Medicine | Admitting: Internal Medicine

## 2022-06-02 VITALS — BP 187/79 | HR 68 | Temp 98.3°F | Resp 20

## 2022-06-02 DIAGNOSIS — M810 Age-related osteoporosis without current pathological fracture: Secondary | ICD-10-CM | POA: Insufficient documentation

## 2022-06-02 MED ORDER — DENOSUMAB 60 MG/ML ~~LOC~~ SOSY
60.0000 mg | PREFILLED_SYRINGE | Freq: Once | SUBCUTANEOUS | Status: AC
  Administered 2022-06-02: 60 mg via SUBCUTANEOUS

## 2022-06-02 NOTE — Progress Notes (Signed)
Diagnosis: Osteoporosis   Provider:  Wende Neighbors MD   Procedure: Injection   Prolia (Denosumab), Dose: 60 mg, Site: subcutaneous, Number of injections: 1   Discharge: Condition: Good, Destination: Home . AVS provided to patient.    Performed by:  Azzie Almas, RN

## 2022-06-02 NOTE — Addendum Note (Signed)
Encounter addended by: Baxter Hire, RN on: 06/02/2022 11:59 AM  Actions taken: Therapy plan modified

## 2022-06-03 ENCOUNTER — Ambulatory Visit (HOSPITAL_COMMUNITY)
Admission: RE | Admit: 2022-06-03 | Discharge: 2022-06-03 | Disposition: A | Discharge status: Home / Self Care | Payer: Medicare Other | Source: Ambulatory Visit | Attending: Internal Medicine | Admitting: Internal Medicine

## 2022-06-03 DIAGNOSIS — Z1231 Encounter for screening mammogram for malignant neoplasm of breast: Secondary | ICD-10-CM

## 2022-06-07 DIAGNOSIS — E782 Mixed hyperlipidemia: Secondary | ICD-10-CM | POA: Diagnosis not present

## 2022-06-07 DIAGNOSIS — M810 Age-related osteoporosis without current pathological fracture: Secondary | ICD-10-CM | POA: Diagnosis not present

## 2022-06-07 DIAGNOSIS — L659 Nonscarring hair loss, unspecified: Secondary | ICD-10-CM | POA: Diagnosis not present

## 2022-06-07 DIAGNOSIS — Z96643 Presence of artificial hip joint, bilateral: Secondary | ICD-10-CM | POA: Diagnosis not present

## 2022-06-07 DIAGNOSIS — I701 Atherosclerosis of renal artery: Secondary | ICD-10-CM | POA: Diagnosis not present

## 2022-06-07 DIAGNOSIS — R944 Abnormal results of kidney function studies: Secondary | ICD-10-CM | POA: Diagnosis not present

## 2022-06-07 DIAGNOSIS — H9113 Presbycusis, bilateral: Secondary | ICD-10-CM | POA: Diagnosis not present

## 2022-06-07 DIAGNOSIS — B0229 Other postherpetic nervous system involvement: Secondary | ICD-10-CM | POA: Diagnosis not present

## 2022-06-07 DIAGNOSIS — I10 Essential (primary) hypertension: Secondary | ICD-10-CM | POA: Diagnosis not present

## 2022-06-07 DIAGNOSIS — L309 Dermatitis, unspecified: Secondary | ICD-10-CM | POA: Diagnosis not present

## 2022-06-07 DIAGNOSIS — R7303 Prediabetes: Secondary | ICD-10-CM | POA: Diagnosis not present

## 2022-06-07 DIAGNOSIS — K219 Gastro-esophageal reflux disease without esophagitis: Secondary | ICD-10-CM | POA: Diagnosis not present

## 2022-06-07 DIAGNOSIS — Z87891 Personal history of nicotine dependence: Secondary | ICD-10-CM | POA: Diagnosis not present

## 2022-08-22 DIAGNOSIS — M79675 Pain in left toe(s): Secondary | ICD-10-CM | POA: Diagnosis not present

## 2022-08-22 DIAGNOSIS — B351 Tinea unguium: Secondary | ICD-10-CM | POA: Diagnosis not present

## 2022-08-22 DIAGNOSIS — L600 Ingrowing nail: Secondary | ICD-10-CM | POA: Diagnosis not present

## 2022-08-22 DIAGNOSIS — M79674 Pain in right toe(s): Secondary | ICD-10-CM | POA: Diagnosis not present

## 2022-09-14 ENCOUNTER — Ambulatory Visit: Payer: Medicare Other | Admitting: Cardiology

## 2022-09-22 ENCOUNTER — Encounter: Payer: Medicare Other | Admitting: Obstetrics & Gynecology

## 2022-09-28 ENCOUNTER — Other Ambulatory Visit: Payer: Self-pay

## 2022-10-26 ENCOUNTER — Telehealth: Payer: Self-pay

## 2022-10-26 NOTE — Telephone Encounter (Signed)
Auth Submission: NO AUTH NEEDED Site of care: Site of care: [facility] Payer: Medicare A/B & AARP Medication & CPT/J Code(s) submitted: Prolia (Denosumab) [id number]  Auth type: Buy/Bill HB Units/visits requested: 60mg  q 6 months  Approval from: 10/26/22 to 02/25/23

## 2022-11-07 DIAGNOSIS — M79674 Pain in right toe(s): Secondary | ICD-10-CM | POA: Diagnosis not present

## 2022-11-07 DIAGNOSIS — L600 Ingrowing nail: Secondary | ICD-10-CM | POA: Diagnosis not present

## 2022-11-07 DIAGNOSIS — M79675 Pain in left toe(s): Secondary | ICD-10-CM | POA: Diagnosis not present

## 2022-11-07 DIAGNOSIS — B351 Tinea unguium: Secondary | ICD-10-CM | POA: Diagnosis not present

## 2022-12-01 ENCOUNTER — Encounter (HOSPITAL_COMMUNITY): Admission: RE | Admit: 2022-12-01 | Payer: Medicare Other | Source: Ambulatory Visit

## 2022-12-01 ENCOUNTER — Encounter: Payer: Medicare Other | Attending: Internal Medicine

## 2022-12-01 VITALS — BP 166/86 | HR 64 | Temp 98.0°F | Resp 18

## 2022-12-01 DIAGNOSIS — M810 Age-related osteoporosis without current pathological fracture: Secondary | ICD-10-CM | POA: Diagnosis not present

## 2022-12-01 DIAGNOSIS — Z79620 Long term (current) use of immunosuppressive biologic: Secondary | ICD-10-CM | POA: Insufficient documentation

## 2022-12-01 MED ORDER — DENOSUMAB 60 MG/ML ~~LOC~~ SOSY
60.0000 mg | PREFILLED_SYRINGE | Freq: Once | SUBCUTANEOUS | Status: AC
  Administered 2022-12-01: 60 mg via SUBCUTANEOUS

## 2022-12-01 NOTE — Progress Notes (Addendum)
Diagnosis: Osteoporosis  Provider:  Kathleene Hazel. Margo Aye , MD  Procedure: Injection  Prolia (Denosumab), Dose: 60 mg, Site: subcutaneous, Number of injections: 1  Post Care: Patient declined observation  Discharge: Condition: Good, Destination: Home . AVS Provided  Performed by:  Marlow Baars [place], RN

## 2022-12-08 DIAGNOSIS — M810 Age-related osteoporosis without current pathological fracture: Secondary | ICD-10-CM | POA: Diagnosis not present

## 2022-12-08 DIAGNOSIS — R7303 Prediabetes: Secondary | ICD-10-CM | POA: Diagnosis not present

## 2022-12-08 DIAGNOSIS — E782 Mixed hyperlipidemia: Secondary | ICD-10-CM | POA: Diagnosis not present

## 2022-12-14 ENCOUNTER — Other Ambulatory Visit (HOSPITAL_COMMUNITY): Payer: Self-pay | Admitting: Internal Medicine

## 2022-12-14 DIAGNOSIS — B0229 Other postherpetic nervous system involvement: Secondary | ICD-10-CM | POA: Diagnosis not present

## 2022-12-14 DIAGNOSIS — M810 Age-related osteoporosis without current pathological fracture: Secondary | ICD-10-CM

## 2022-12-14 DIAGNOSIS — Z96643 Presence of artificial hip joint, bilateral: Secondary | ICD-10-CM | POA: Diagnosis not present

## 2022-12-14 DIAGNOSIS — L659 Nonscarring hair loss, unspecified: Secondary | ICD-10-CM | POA: Diagnosis not present

## 2022-12-14 DIAGNOSIS — L309 Dermatitis, unspecified: Secondary | ICD-10-CM | POA: Diagnosis not present

## 2022-12-14 DIAGNOSIS — M545 Low back pain, unspecified: Secondary | ICD-10-CM | POA: Diagnosis not present

## 2022-12-14 DIAGNOSIS — R944 Abnormal results of kidney function studies: Secondary | ICD-10-CM | POA: Diagnosis not present

## 2022-12-14 DIAGNOSIS — I701 Atherosclerosis of renal artery: Secondary | ICD-10-CM | POA: Diagnosis not present

## 2022-12-14 DIAGNOSIS — E559 Vitamin D deficiency, unspecified: Secondary | ICD-10-CM | POA: Diagnosis not present

## 2022-12-14 DIAGNOSIS — K219 Gastro-esophageal reflux disease without esophagitis: Secondary | ICD-10-CM | POA: Diagnosis not present

## 2022-12-14 DIAGNOSIS — I10 Essential (primary) hypertension: Secondary | ICD-10-CM | POA: Diagnosis not present

## 2022-12-14 DIAGNOSIS — E782 Mixed hyperlipidemia: Secondary | ICD-10-CM | POA: Diagnosis not present

## 2022-12-20 ENCOUNTER — Ambulatory Visit (HOSPITAL_COMMUNITY)
Admission: RE | Admit: 2022-12-20 | Discharge: 2022-12-20 | Disposition: A | Discharge status: Home / Self Care | Payer: Medicare Other | Source: Ambulatory Visit | Attending: Internal Medicine | Admitting: Internal Medicine

## 2022-12-20 DIAGNOSIS — M810 Age-related osteoporosis without current pathological fracture: Secondary | ICD-10-CM | POA: Diagnosis not present

## 2022-12-20 DIAGNOSIS — R29890 Loss of height: Secondary | ICD-10-CM | POA: Diagnosis not present

## 2022-12-20 DIAGNOSIS — S4290XA Fracture of unspecified shoulder girdle, part unspecified, initial encounter for closed fracture: Secondary | ICD-10-CM | POA: Diagnosis not present

## 2023-02-06 DIAGNOSIS — I10 Essential (primary) hypertension: Secondary | ICD-10-CM | POA: Diagnosis not present

## 2023-02-07 ENCOUNTER — Encounter: Payer: Self-pay | Admitting: Cardiology

## 2023-02-07 ENCOUNTER — Other Ambulatory Visit: Payer: Self-pay | Admitting: Cardiology

## 2023-02-07 ENCOUNTER — Encounter: Payer: Medicare Other | Attending: Internal Medicine | Admitting: Cardiology

## 2023-02-07 VITALS — BP 132/82 | HR 60 | Ht 61.0 in | Wt 143.2 lb

## 2023-02-07 DIAGNOSIS — I10 Essential (primary) hypertension: Secondary | ICD-10-CM | POA: Diagnosis not present

## 2023-02-07 DIAGNOSIS — E782 Mixed hyperlipidemia: Secondary | ICD-10-CM | POA: Insufficient documentation

## 2023-02-07 DIAGNOSIS — I119 Hypertensive heart disease without heart failure: Secondary | ICD-10-CM | POA: Diagnosis not present

## 2023-02-07 DIAGNOSIS — Z8679 Personal history of other diseases of the circulatory system: Secondary | ICD-10-CM | POA: Diagnosis not present

## 2023-02-07 MED ORDER — EZETIMIBE 10 MG PO TABS: 10.0000 mg | ORAL_TABLET | Freq: Every day | ORAL | 2 refills | Status: AC

## 2023-02-07 NOTE — Patient Instructions (Addendum)

## 2023-02-07 NOTE — Progress Notes (Signed)
    Cardiology Office Note  Date: 02/07/2023   ID: [name], DOB October 09, 1933, MRN [id number]  History of Present Illness: [name] is an 89 y.o. female last seen in April 2022.  She presents overdue for follow-up.  She is here for a routine visit.  States that she has been doing very well.  She enjoys playing bridge, functional with ADLs.  Does not indicate any major health change over the last few years.  I reviewed her medications.  Cardiac regimen includes Coreg, Zetia, and Cozaar.  She reports no intolerances.  Continues to follow with Dr. Margo Aye for primary care.  I reviewed her interval lab work.  I reviewed her ECG today which shows sinus bradycardia with prolonged PR interval, LVH with T wave inversions inferiorly and anteroseptally (fluctuating degree over serial tracings).  Physical Exam: VS:  BP 132/82 (BP Location: Left Arm)   Pulse 60   Ht 5\' 1"  (1.549 m)   Wt 143 lb 3.2 oz (65 kg)   SpO2 95%   BMI 27.06 kg/m , BMI Body mass index is 27.06 kg/m.  Wt Readings from Last 3 Encounters:  02/07/23 143 lb 3.2 oz (65 kg)  04/07/21 143 lb 3.2 oz (65 kg)  12/02/20 145 lb 1 oz (65.8 kg)    General: Patient appears comfortable at rest. HEENT: Conjunctiva and lids normal. Neck: Supple, no elevated JVP or carotid bruits. Lungs: Clear to auscultation, nonlabored breathing at rest. Cardiac: Regular rate and rhythm, no S3 or significant systolic murmur.  ECG:  An ECG dated 06/30/2020 was personally reviewed today and demonstrated:  Sinus rhythm with prolonged PR interval, R' in lead V1 with borderline increased voltage.  Labwork:  September 2024: Hemoglobin 12.7, platelets 173, BUN 10, creatinine 0.78, potassium 4.8, AST 17, ALT 11, cholesterol 198, triglycerides 77, HDL 64, LDL 120, hemoglobin A1c 5.7%  Other Studies Reviewed Today:  No interval cardiac testing for review today.  Assessment and Plan:  1.  Primary hypertension.  She checks blood pressure at home and  continues on Cozaar and Coreg.  No changes were made today.   2.  History of renal artery stenosis status post angioplasty in the 1980s.  In the absence of any accelerating and difficulty control hypertension or worsening renal function, no clear indication for repeat imaging at this time.   3.  Mixed hyperlipidemia, she has a history of statin intolerance and is on Zetia with follow-up by Dr. Margo Aye.  LDL 120 in September.  Refill provided.  Disposition:  Follow up  1 year.  Signed, Jonelle Sidle, M.D., F.A.C.C. [facility]

## 2023-02-17 DIAGNOSIS — U071 COVID-19: Secondary | ICD-10-CM | POA: Diagnosis not present

## 2023-02-21 DIAGNOSIS — Z713 Dietary counseling and surveillance: Secondary | ICD-10-CM | POA: Diagnosis not present

## 2023-02-21 DIAGNOSIS — I10 Essential (primary) hypertension: Secondary | ICD-10-CM | POA: Diagnosis not present

## 2023-02-21 DIAGNOSIS — U071 COVID-19: Secondary | ICD-10-CM | POA: Diagnosis not present

## 2023-02-21 DIAGNOSIS — Z7182 Exercise counseling: Secondary | ICD-10-CM | POA: Diagnosis not present

## 2023-02-21 DIAGNOSIS — Z87891 Personal history of nicotine dependence: Secondary | ICD-10-CM | POA: Diagnosis not present

## 2023-02-21 DIAGNOSIS — Z79899 Other long term (current) drug therapy: Secondary | ICD-10-CM | POA: Diagnosis not present

## 2023-02-21 DIAGNOSIS — J029 Acute pharyngitis, unspecified: Secondary | ICD-10-CM | POA: Diagnosis not present

## 2023-02-21 DIAGNOSIS — Z6826 Body mass index (BMI) 26.0-26.9, adult: Secondary | ICD-10-CM | POA: Diagnosis not present

## 2023-02-21 DIAGNOSIS — M810 Age-related osteoporosis without current pathological fracture: Secondary | ICD-10-CM | POA: Diagnosis not present

## 2023-03-02 ENCOUNTER — Other Ambulatory Visit: Payer: Self-pay | Admitting: Cardiology

## 2023-03-20 ENCOUNTER — Other Ambulatory Visit: Payer: Self-pay | Admitting: Internal Medicine

## 2023-03-20 ENCOUNTER — Telehealth: Payer: Self-pay

## 2023-03-20 DIAGNOSIS — B0239 Other herpes zoster eye disease: Secondary | ICD-10-CM | POA: Diagnosis not present

## 2023-03-20 DIAGNOSIS — H18513 Endothelial corneal dystrophy, bilateral: Secondary | ICD-10-CM | POA: Diagnosis not present

## 2023-03-20 NOTE — Telephone Encounter (Signed)
 Auth Submission: NO AUTH NEEDED Site of care: Site of care: [facility] Payer: medicare a/b, aarp supp Medication & CPT/J Code(s) submitted: Prolia  (Denosumab ) [id number] Route of submission (phone, fax, portal):  Phone # Fax # Auth type: Buy/Bill HB Units/visits requested: 60mg , q2months Reference number:  Approval from: 03/20/23 to 03/13/24

## 2023-05-01 ENCOUNTER — Other Ambulatory Visit: Payer: Self-pay

## 2023-05-03 ENCOUNTER — Other Ambulatory Visit (HOSPITAL_COMMUNITY): Payer: Self-pay | Admitting: Internal Medicine

## 2023-05-03 DIAGNOSIS — Z1231 Encounter for screening mammogram for malignant neoplasm of breast: Secondary | ICD-10-CM

## 2023-06-01 ENCOUNTER — Encounter: Payer: Medicare Other | Attending: Dermatology | Admitting: Emergency Medicine

## 2023-06-01 VITALS — BP 157/88 | HR 62 | Temp 97.7°F | Resp 18

## 2023-06-01 DIAGNOSIS — M810 Age-related osteoporosis without current pathological fracture: Secondary | ICD-10-CM | POA: Diagnosis not present

## 2023-06-01 MED ORDER — DENOSUMAB 60 MG/ML ~~LOC~~ SOSY
60.0000 mg | PREFILLED_SYRINGE | Freq: Once | SUBCUTANEOUS | Status: AC
  Administered 2023-06-01: 60 mg via SUBCUTANEOUS

## 2023-06-01 NOTE — Progress Notes (Signed)
 Diagnosis: Osteoporosis  Provider:  Dwana Melena MD  Procedure: Injection  Prolia (Denosumab), Dose: 60 mg, Site: subcutaneous, Number of injections: 1  Injection Site(s): Left arm  Post Care: Patient declined observation  Discharge: Condition: Good, Destination: Home . AVS Provided  Performed by:  Arrie Senate, RN

## 2023-06-05 ENCOUNTER — Inpatient Hospital Stay (HOSPITAL_COMMUNITY): Admission: RE | Admit: 2023-06-05 | Payer: Medicare Other | Source: Ambulatory Visit

## 2023-06-06 DIAGNOSIS — M810 Age-related osteoporosis without current pathological fracture: Secondary | ICD-10-CM | POA: Diagnosis not present

## 2023-06-06 DIAGNOSIS — E782 Mixed hyperlipidemia: Secondary | ICD-10-CM | POA: Diagnosis not present

## 2023-06-06 DIAGNOSIS — R7303 Prediabetes: Secondary | ICD-10-CM | POA: Diagnosis not present

## 2023-06-13 ENCOUNTER — Encounter: Payer: Self-pay | Admitting: Internal Medicine

## 2023-06-13 DIAGNOSIS — R7303 Prediabetes: Secondary | ICD-10-CM | POA: Diagnosis not present

## 2023-06-13 DIAGNOSIS — E559 Vitamin D deficiency, unspecified: Secondary | ICD-10-CM | POA: Diagnosis not present

## 2023-06-13 DIAGNOSIS — I701 Atherosclerosis of renal artery: Secondary | ICD-10-CM | POA: Diagnosis not present

## 2023-06-13 DIAGNOSIS — B0229 Other postherpetic nervous system involvement: Secondary | ICD-10-CM | POA: Diagnosis not present

## 2023-06-13 DIAGNOSIS — R944 Abnormal results of kidney function studies: Secondary | ICD-10-CM | POA: Diagnosis not present

## 2023-06-13 DIAGNOSIS — M810 Age-related osteoporosis without current pathological fracture: Secondary | ICD-10-CM | POA: Diagnosis not present

## 2023-06-13 DIAGNOSIS — I10 Essential (primary) hypertension: Secondary | ICD-10-CM | POA: Diagnosis not present

## 2023-06-13 DIAGNOSIS — E782 Mixed hyperlipidemia: Secondary | ICD-10-CM | POA: Diagnosis not present

## 2023-06-13 DIAGNOSIS — L659 Nonscarring hair loss, unspecified: Secondary | ICD-10-CM | POA: Diagnosis not present

## 2023-06-13 DIAGNOSIS — L309 Dermatitis, unspecified: Secondary | ICD-10-CM | POA: Diagnosis not present

## 2023-06-13 DIAGNOSIS — K219 Gastro-esophageal reflux disease without esophagitis: Secondary | ICD-10-CM | POA: Diagnosis not present

## 2023-06-14 ENCOUNTER — Encounter (HOSPITAL_COMMUNITY): Payer: Self-pay

## 2023-06-14 ENCOUNTER — Ambulatory Visit (HOSPITAL_COMMUNITY)
Admission: RE | Admit: 2023-06-14 | Discharge: 2023-06-14 | Disposition: A | Discharge status: Home / Self Care | Source: Ambulatory Visit | Attending: Internal Medicine | Admitting: Internal Medicine

## 2023-06-14 DIAGNOSIS — Z1231 Encounter for screening mammogram for malignant neoplasm of breast: Secondary | ICD-10-CM | POA: Insufficient documentation

## 2023-08-03 ENCOUNTER — Other Ambulatory Visit: Payer: Self-pay | Admitting: Cardiology

## 2023-11-19 IMAGING — MG MM DIGITAL SCREENING BILAT W/ TOMO AND CAD
8 series · 8 of 24 positions shown · non-contrast
Comparison: Previous exam(s).

CLINICAL DATA: Screening.

EXAM:
DIGITAL SCREENING BILATERAL MAMMOGRAM WITH TOMOSYNTHESIS AND CAD
TECHNIQUE: Bilateral screening digital craniocaudal and mediolateral oblique
mammograms were obtained. Bilateral screening digital breast
tomosynthesis was performed. The images were evaluated with
computer-aided detection.

[R MLO synth-2D]
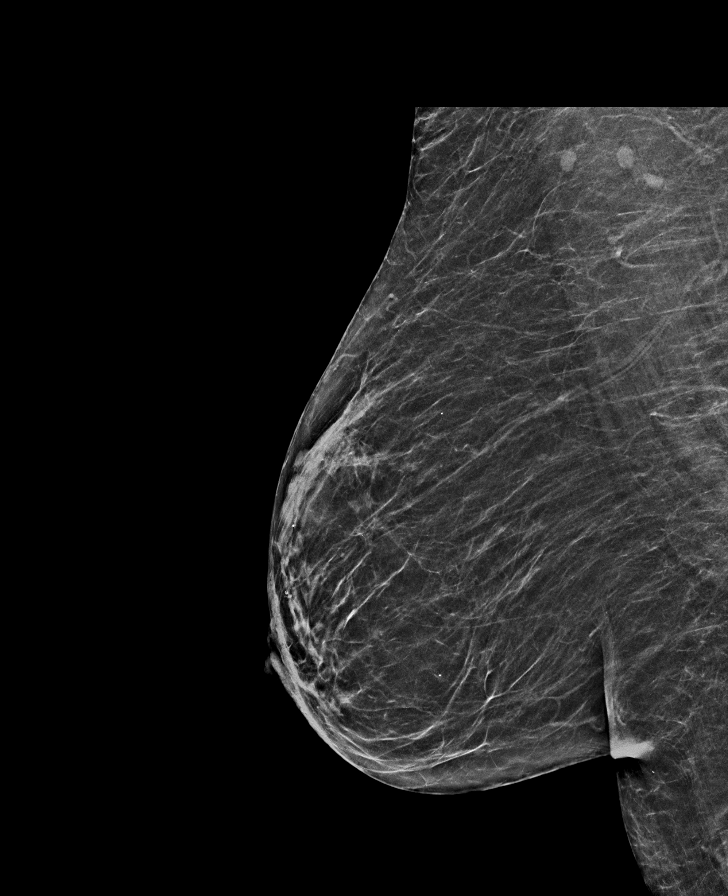

[L MLO synth-2D]
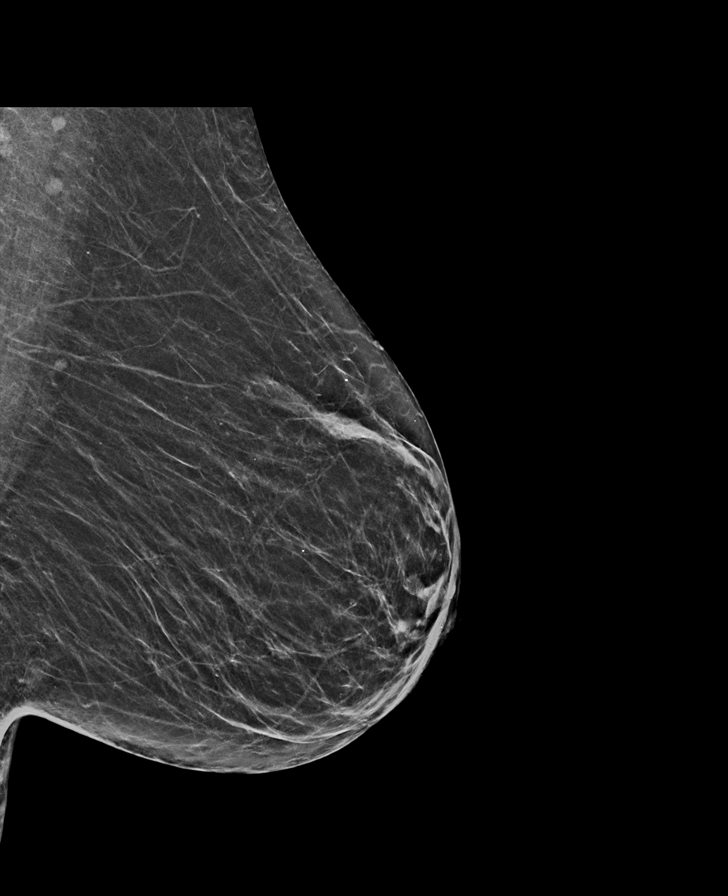

[R CC synth-2D]
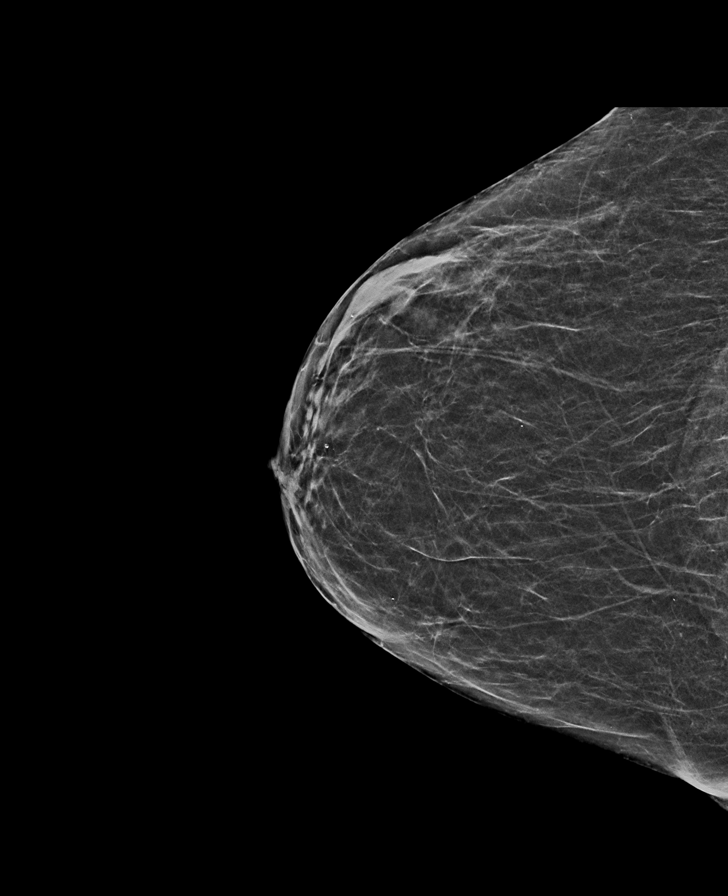

[L CC synth-2D]
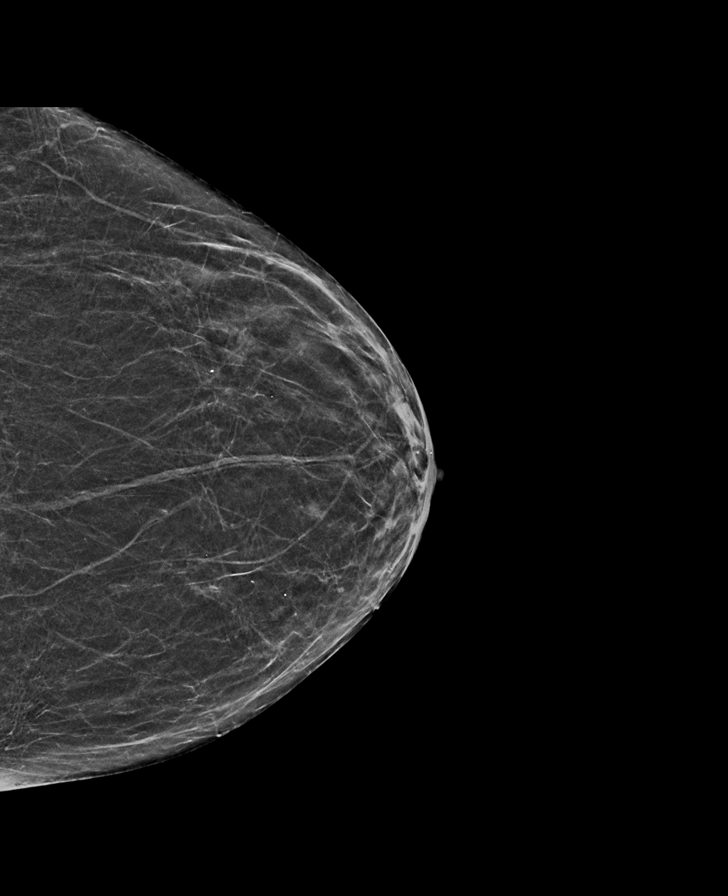

[R MLO tomo · tomo slice 25/50.0]
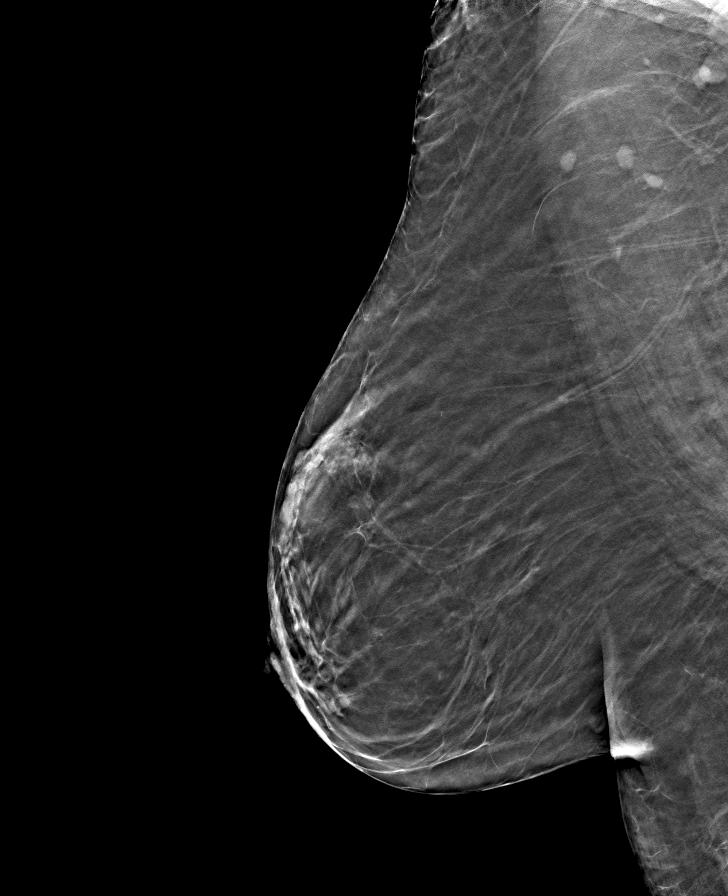

[R CC tomo · tomo slice 23/46.0]
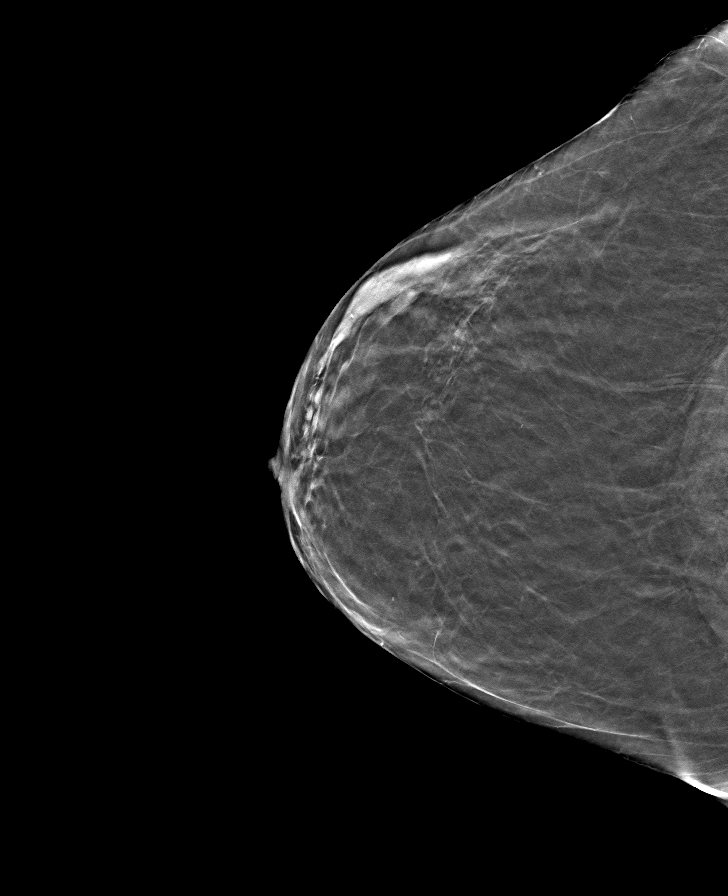

[L MLO tomo · tomo slice 25/48.0]
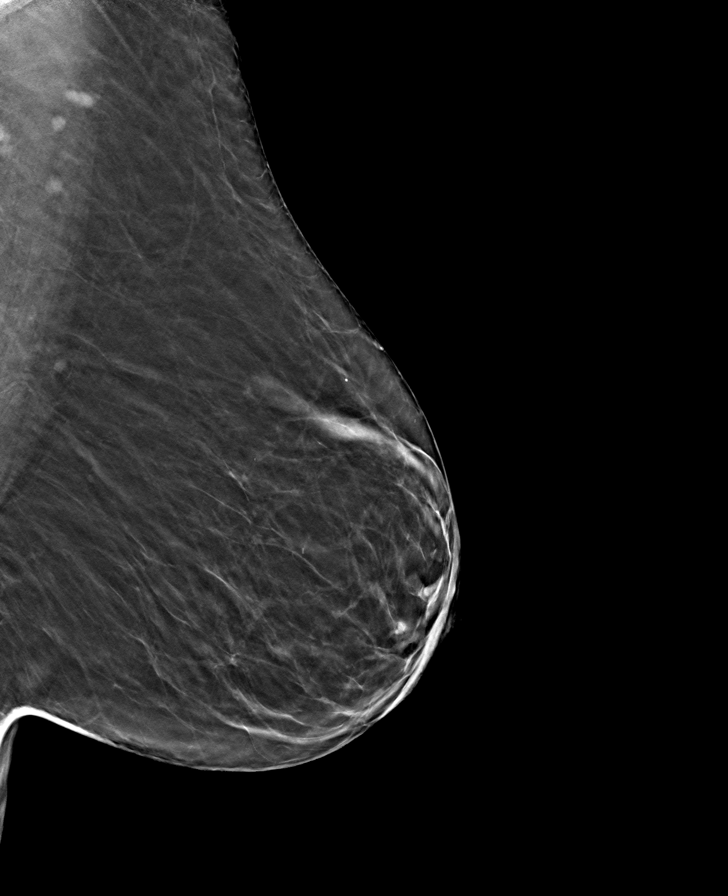

[L CC tomo · tomo slice 23/45.0]
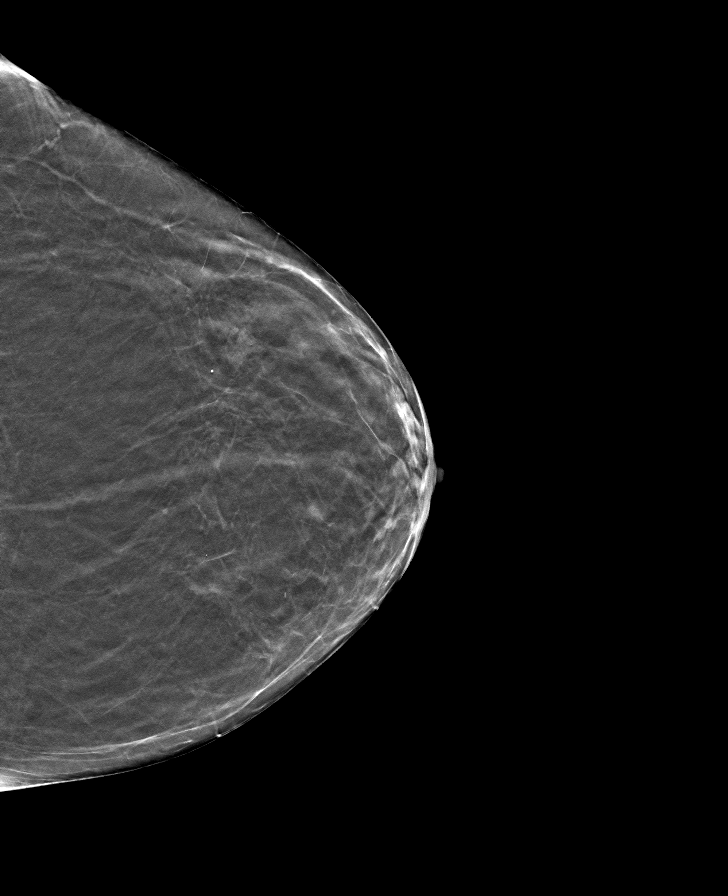

[8 of 24 positions shown; findings below may reference images not displayed]

ACR Breast Density Category b: There are scattered areas of
fibroglandular density.
FINDINGS: There are no findings suspicious for malignancy.
IMPRESSION: No mammographic evidence of malignancy. A result letter of this
screening mammogram will be mailed directly to the patient.

RECOMMENDATION:
Screening mammogram in one year. (Code:51-O-LD2)

BI-RADS CATEGORY  1: Negative.

## 2023-12-01 DIAGNOSIS — M810 Age-related osteoporosis without current pathological fracture: Secondary | ICD-10-CM | POA: Diagnosis not present

## 2023-12-01 DIAGNOSIS — E782 Mixed hyperlipidemia: Secondary | ICD-10-CM | POA: Diagnosis not present

## 2023-12-01 DIAGNOSIS — R7303 Prediabetes: Secondary | ICD-10-CM | POA: Diagnosis not present

## 2023-12-05 ENCOUNTER — Ambulatory Visit

## 2023-12-07 DIAGNOSIS — I10 Essential (primary) hypertension: Secondary | ICD-10-CM | POA: Diagnosis not present

## 2023-12-07 DIAGNOSIS — I701 Atherosclerosis of renal artery: Secondary | ICD-10-CM | POA: Diagnosis not present

## 2023-12-07 DIAGNOSIS — E782 Mixed hyperlipidemia: Secondary | ICD-10-CM | POA: Diagnosis not present

## 2023-12-07 DIAGNOSIS — K219 Gastro-esophageal reflux disease without esophagitis: Secondary | ICD-10-CM | POA: Diagnosis not present

## 2023-12-07 DIAGNOSIS — M810 Age-related osteoporosis without current pathological fracture: Secondary | ICD-10-CM | POA: Diagnosis not present

## 2023-12-07 DIAGNOSIS — R7303 Prediabetes: Secondary | ICD-10-CM | POA: Diagnosis not present

## 2023-12-07 DIAGNOSIS — Z23 Encounter for immunization: Secondary | ICD-10-CM | POA: Diagnosis not present

## 2023-12-07 DIAGNOSIS — R944 Abnormal results of kidney function studies: Secondary | ICD-10-CM | POA: Diagnosis not present

## 2023-12-07 DIAGNOSIS — L659 Nonscarring hair loss, unspecified: Secondary | ICD-10-CM | POA: Diagnosis not present

## 2023-12-07 DIAGNOSIS — E559 Vitamin D deficiency, unspecified: Secondary | ICD-10-CM | POA: Diagnosis not present

## 2023-12-07 DIAGNOSIS — Z96643 Presence of artificial hip joint, bilateral: Secondary | ICD-10-CM | POA: Diagnosis not present

## 2023-12-07 DIAGNOSIS — L309 Dermatitis, unspecified: Secondary | ICD-10-CM | POA: Diagnosis not present

## 2023-12-07 DIAGNOSIS — B0229 Other postherpetic nervous system involvement: Secondary | ICD-10-CM | POA: Diagnosis not present

## 2023-12-08 ENCOUNTER — Encounter: Attending: Internal Medicine | Admitting: *Deleted

## 2023-12-08 VITALS — BP 123/66 | HR 65 | Temp 98.0°F | Resp 16

## 2023-12-08 DIAGNOSIS — M810 Age-related osteoporosis without current pathological fracture: Secondary | ICD-10-CM | POA: Diagnosis not present

## 2023-12-08 MED ORDER — DENOSUMAB 60 MG/ML ~~LOC~~ SOSY
60.0000 mg | PREFILLED_SYRINGE | Freq: Once | SUBCUTANEOUS | Status: AC
  Administered 2023-12-08: 60 mg via SUBCUTANEOUS

## 2023-12-08 NOTE — Progress Notes (Signed)
 Diagnosis: Osteoporosis  Provider:  Izetta Marshall MD  Procedure: Injection  Prolia  (Denosumab ), Dose: 60 mg, Site: subcutaneous, Number of injections: 1  Injection Site(s): Right upper quad. abdomen  Post Care: Observation period completed  Discharge: Condition: Good, Destination: Home . AVS Provided  Performed by:  Verneda Golder, RN

## 2024-01-04 ENCOUNTER — Other Ambulatory Visit: Payer: Self-pay | Admitting: Cardiology

## 2024-02-12 DIAGNOSIS — C44629 Squamous cell carcinoma of skin of left upper limb, including shoulder: Secondary | ICD-10-CM | POA: Diagnosis not present

## 2024-02-12 DIAGNOSIS — D485 Neoplasm of uncertain behavior of skin: Secondary | ICD-10-CM | POA: Diagnosis not present

## 2024-02-13 DIAGNOSIS — L570 Actinic keratosis: Secondary | ICD-10-CM | POA: Diagnosis not present

## 2024-02-13 DIAGNOSIS — L309 Dermatitis, unspecified: Secondary | ICD-10-CM | POA: Diagnosis not present

## 2024-02-13 DIAGNOSIS — L821 Other seborrheic keratosis: Secondary | ICD-10-CM | POA: Diagnosis not present

## 2024-04-10 ENCOUNTER — Ambulatory Visit: Admitting: Student

## 2024-04-12 ENCOUNTER — Ambulatory Visit: Admitting: Student

## 2024-06-10 ENCOUNTER — Encounter
